# Patient Record
Sex: Female | Born: 1935 | Hispanic: No | Smoking: Never smoker
Health system: Southern US, Community
[De-identification: ages and names within clinical notes are randomized; demographics above are authoritative.]

## PROBLEM LIST (undated history)

## (undated) DIAGNOSIS — M5136 Other intervertebral disc degeneration, lumbar region: Secondary | ICD-10-CM

## (undated) DIAGNOSIS — E079 Disorder of thyroid, unspecified: Secondary | ICD-10-CM

## (undated) DIAGNOSIS — M419 Scoliosis, unspecified: Secondary | ICD-10-CM

## (undated) DIAGNOSIS — M51369 Other intervertebral disc degeneration, lumbar region without mention of lumbar back pain or lower extremity pain: Secondary | ICD-10-CM

## (undated) DIAGNOSIS — C801 Malignant (primary) neoplasm, unspecified: Secondary | ICD-10-CM

## (undated) DIAGNOSIS — M810 Age-related osteoporosis without current pathological fracture: Secondary | ICD-10-CM

## (undated) DIAGNOSIS — M5134 Other intervertebral disc degeneration, thoracic region: Secondary | ICD-10-CM

## (undated) HISTORY — PX: TONSILLECTOMY: SUR1361

## (undated) HISTORY — DX: Age-related osteoporosis without current pathological fracture: M81.0

## (undated) HISTORY — DX: Other intervertebral disc degeneration, lumbar region without mention of lumbar back pain or lower extremity pain: M51.369

## (undated) HISTORY — DX: Scoliosis, unspecified: M41.9

## (undated) HISTORY — DX: Malignant (primary) neoplasm, unspecified: C80.1

## (undated) HISTORY — DX: Other intervertebral disc degeneration, lumbar region: M51.36

## (undated) HISTORY — PX: TOTAL THYROIDECTOMY: SHX2547

## (undated) HISTORY — DX: Other intervertebral disc degeneration, thoracic region: M51.34

## (undated) HISTORY — DX: Disorder of thyroid, unspecified: E07.9

---

## 2001-10-05 ENCOUNTER — Other Ambulatory Visit: Admission: RE | Admit: 2001-10-05 | Discharge: 2001-10-05 | Payer: Self-pay | Admitting: Family Medicine

## 2003-04-17 ENCOUNTER — Ambulatory Visit (HOSPITAL_COMMUNITY): Admission: RE | Admit: 2003-04-17 | Discharge: 2003-04-17 | Payer: Self-pay | Admitting: General Surgery

## 2003-04-17 ENCOUNTER — Encounter: Payer: Self-pay | Admitting: General Surgery

## 2003-04-18 ENCOUNTER — Encounter: Payer: Self-pay | Admitting: General Surgery

## 2003-06-07 ENCOUNTER — Encounter (INDEPENDENT_AMBULATORY_CARE_PROVIDER_SITE_OTHER): Payer: Self-pay

## 2003-06-08 ENCOUNTER — Inpatient Hospital Stay (HOSPITAL_COMMUNITY): Admission: RE | Admit: 2003-06-08 | Discharge: 2003-06-09 | Payer: Self-pay | Admitting: General Surgery

## 2004-05-29 ENCOUNTER — Encounter (INDEPENDENT_AMBULATORY_CARE_PROVIDER_SITE_OTHER): Payer: Self-pay | Admitting: *Deleted

## 2004-05-29 ENCOUNTER — Ambulatory Visit (HOSPITAL_COMMUNITY): Admission: RE | Admit: 2004-05-29 | Discharge: 2004-05-29 | Payer: Self-pay | Admitting: Gastroenterology

## 2006-06-10 ENCOUNTER — Emergency Department (HOSPITAL_COMMUNITY): Admission: EM | Admit: 2006-06-10 | Discharge: 2006-06-10 | Payer: Self-pay | Admitting: Emergency Medicine

## 2007-09-22 ENCOUNTER — Ambulatory Visit: Payer: Self-pay | Admitting: *Deleted

## 2010-11-29 ENCOUNTER — Encounter (HOSPITAL_BASED_OUTPATIENT_CLINIC_OR_DEPARTMENT_OTHER): Payer: Self-pay | Admitting: General Surgery

## 2010-11-29 ENCOUNTER — Encounter: Payer: Self-pay | Admitting: Endocrinology

## 2010-11-30 ENCOUNTER — Encounter: Payer: Self-pay | Admitting: Internal Medicine

## 2011-03-24 NOTE — Procedures (Signed)
CAROTID DUPLEX EXAM   INDICATION:  Right carotid bruit.   HISTORY:  Diabetes:  No  Cardiac:  No  Hypertension:  Yes  Smoking:  No  Previous Surgery:  No  CV History:  No  Amaurosis Fugax No, Paresthesias No, Hemiparesis No                                       RIGHT             LEFT  Brachial systolic pressure:         130               130  Brachial Doppler waveforms:         Biphasic          Biphasic  Vertebral direction of flow:        Antegrade         Antegrade  DUPLEX VELOCITIES (cm/sec)  CCA peak systolic                   70                80  ECA peak systolic                   67                146  ICA peak systolic                   219 (distal)      190 (distal)  ICA end diastolic                   37                66  PLAQUE MORPHOLOGY:                  Heterogeneous     Heterogeneous  PLAQUE AMOUNT:                      Moderate          Moderate  PLAQUE LOCATION:                    ICA distal        ICA distal   IMPRESSION:  1. 60-79% stenosis noted in the right distal ICA.  2. 40-59% stenosis noted in the left distal ICA.  3. Antegrade bilateral vertebral arteries.   ___________________________________________  P. Liliane Bade, M.D.   MG/MEDQ  D:  09/22/2007  T:  09/23/2007  Job:  [id number]

## 2011-03-24 NOTE — Consult Note (Signed)
VASCULAR SURGERY CONSULTATION   Patricia Campbell, Patricia Campbell  DOB:  05-06-1927                                       09/22/2007  GMWNU#:[id number]   REASON FOR CONSULTATION:  Right carotid bruit.   HISTORY:  Patient is a 77 year old female referred for evaluation and  management of a right carotid bruit.  She has a history of headache.  She also complains of insomnia.  Has denied a head CT for workup of  headache.  No history of stroke.  Risk factors for cerebrovascular  disease include hypertension.  No family history of stroke.  No recent  visual change.  Denies motor or sensory deficit.  No clumsiness.  No  speech problems.   PAST MEDICAL HISTORY:  1. Hypertension.  2. Medullary thyroid cancer.   PAST SURGICAL HISTORY:  1. Total thyroidectomy.  2. Tonsillectomy.  3. Hysterectomy.  4. Appendectomy.   MEDICATIONS:  1. Levoxyl 75 mcg 1 tablet daily.  2. Ambien 10 mg 1 tablet nightly p.r.n.  3. Bisoprolol fumarate 5 mg 1 tablet daily.  4. Coenzyme Q10 1 tablet daily.  5. Bactrim DS 1 tablet b.i.d.   ALLERGIES:  CODEINE.   FAMILY HISTORY:  Mother deceased at age 5 of colon cancer.  Father  deceased at age 20 of lymphoma.  One sister deceased at age 56 of breast  CA and uterine CA.  Another sister deceased at age 75 of uterine CA.   SOCIAL HISTORY:  No alcohol or tobacco use.  She currently lives with  her brother but was married in September, 2008.  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  Limited activity.   REVIEW OF SYSTEMS:  Refer to patient encounter form.  This was reviewed  today.  The only positive complaint is that of a urinary tract  infection.  Denies constitutional, cardiac, pulmonary, GI, vascular,  neurologic, musculoskeletal, psychiatric, hematologic, visual or hearing  changes.   PHYSICAL EXAMINATION:  Moderately frail-appearing 77 year old female.  Alert and oriented.  No acute distress.  No pallor, cyanosis, or  jaundice.  Vital signs:  BP 150/69 left arm, 145/75 right arm.  Pulse 62 per  minute.  Respirations 16 per minute.  O2 saturation 96%.  Neck:  Supple.  No thyromegaly or adenopathy.  Thyroidectomy scar present.  HEENT:  Mouth and throat are clear.  Normocephalic.  Extraocular movements are  intact.  Cardiovascular:  Right carotid bruit.  Normal heart sounds  without murmurs.  No gallops or rubs.  Regular rate and rhythm.  Chest:  Air entry equal bilaterally.  No rales or rhonchi.  Abdomen:  Scaphoid.  No masses or organomegaly.  Nontender.  Neurologic:  Cranial nerves  intact.  Strength equal bilaterally.  Reflexes are 2+ bilaterally.  Normal gait.   INVESTIGATIONS:  Carotid Doppler evaluation reveals 60-79% right ICA  stenosis and 40-59% left ICA stenosis.  Bilateral antegrade vertebral  flow.   IMPRESSION:  1. Moderate asymmetry bilateral internal carotid artery occlusive      disease.  2. Hypertension.  3. History of medullary carcinoma of the thyroid.   MEDICAL DECISION MAKING:  Patient has asymptomatic moderate bilateral  internal carotid artery stenosis.  No history of stroke.  No symptoms of  transient ischemic attack.  I have recommended aspirin 81 mg daily.  Continue risk modification with hypertension control.  PLAN:  Follow up in one year with surveillance Doppler evaluation.   Balinda Quails, M.D.  Electronically Signed  PGH/MEDQ  D:  09/22/2007  T:  09/23/2007  Job:  [id number]

## 2011-03-27 NOTE — Op Note (Signed)
NAME:  Patricia Campbell, Patricia Campbell                          ACCOUNT NO.:  [id number]   MEDICAL RECORD NO.:  [id number]                   PATIENT TYPE:  AMB   LOCATION:  DAY                                  FACILITY:  [facility]   PHYSICIAN:  Anselm Pancoast. Zachery Dakins, M.D.          DATE OF BIRTH:  08-01-1927   DATE OF PROCEDURE:  06/07/2003  DATE OF DISCHARGE:                                 OPERATIVE REPORT   PREOPERATIVE DIAGNOSIS:  Right thyroid nodule.   POSTOPERATIVE DIAGNOSES:  Medullary carcinoma right lobe of thyroid.   OPERATION:  Total thyroid lobectomy.   SURGEON:  Anselm Pancoast. Zachery Dakins, M.D.   ASSISTANT:  Rose Phi. Maple Hudson, M.D.   ANESTHESIA:  General.   HISTORY OF PRESENT ILLNESS:  Patricia Campbell is a 77 year old female whose got  a cone nodule on the right lobe of the thyroid that has been present at  least for probably nine months or more. This does not appear to have  increased in size during this observation. Surgery was originally scheduled  approximately a month ago but rescheduled because of not completing a  cardiac clearance. This has been completed with no abnormalities noted. She  has not been on thyroid replacement.   DESCRIPTION OF PROCEDURE:  The patient was taken to the operative suite, she  was given a g of Kefzol, she has PAS stockings and positioned on the OR  table and after an endotracheal tube had been placed carefully a roll was  placed under her shoulders. You could feel the nodule and it was not  extremely large and the neck and superior chest was prepped with Betadine  solution, draped in a sterile manner. I marked the clavicle and the sternal  notch and took a silk and sore of dented about 2 fingerbreadths midline and  then made a small probably about 2 1/2 inch incision down through the skin  and subcutaneous elevating the platysma. We then elevated the strap muscles  and divided it medially and then carefully dissected on the right lobe of  the thyroid. You  could feel the nodule, it was kind of in the superior mid  portion. The remaining gland looked unremarkable. The mass appeared to be  well encapsulated but was quite firm. The inferior pole was first elevated,  the little branches of the inferior thyroid artery were divided close to the  gland and the larger ones were tied with 3-0 silk and the smaller little  branches were clipped. The middle thyroid vein was identified, this was  clamped proximally and distally and divided and ligated with fine silk  suture. Next, the superior pole was elevated and the little superior artery  was really a smaller branch than I thought would be normal and it was tied  with 3-0 silk and there was no other branch and I did not tie it twice  thinking it was not the major artery when I first  ligated it. We then  elevated the gland kind of medially, rotated it, identified the inferior  recurrent laryngeal nerve and then followed it. We identified an inferior  parathyroid that was left and then rolling the gland and then after the  gland elevated medially I was working on the right identifying the recurrent  laryngeal nerve. We divided the second little branch at the inferior thyroid  artery and then the gland would _______ so we could divide the isthmus  within hemostats. The middle isthmus area was ligated with 3-0 Vicryl and we  sent the lobe for pathology exam. Dr. Laureen Ochs examined it and feels that it is  a medullary carcinoma. With this finding, I had gone ahead and closed the  incision, of course we reopened everything and then with all of the stitches  all removed then elevated the strap muscles off the left lobe, it was normal  by inspection and then did a similar lobectomy on the left. I worked from  the right side on this rotating the gland medially. Two parathyroid glands  were identified and saved and the inferior thyroid artery branches were  taken very close to the thyroid lobe. The recurrent  laryngeal nerve was  identified, there was a larger middle vein on the left that was ligated with  3-0 silk and then the superior thyroid artery was doubly ligated with 3-0  silk. The gland was sent for permanent exam and it was a smaller gland and  grossly appeared to be normal. Good hemostasis on both the right and left  side. I then closed the strap muscles with interrupted sutures of 3-0 Vicryl  and then closed the platysma with interrupted 3-0 Vicryl and then 4-0  Monocryl subcuticular stitches and then Benzoin and Steri-Strips on the  skin. The patient tolerated the procedure nicely and I will have one of the  endocrinologist to see her for consideration of possibly radiation iodine  postoperatively. She will definitely need to go on thyroid replacement but  we ask their opinion in case they want to suppress her for a scan after the  thyroidectomy prior to starting the thyroid replacement.                                               Anselm Pancoast. Zachery Dakins, M.D.    WJW/MEDQ  D:  06/07/2003  T:  06/07/2003  Job:  [id number]   cc:   Sharlet Salina, M.D.  [address]  [place]  [place] [address]  Fax: [phone]

## 2011-03-27 NOTE — Discharge Summary (Signed)
NAME:  Patricia Campbell, Patricia Campbell                          ACCOUNT NO.:  [id number]   MEDICAL RECORD NO.:  [id number]                   PATIENT TYPE:  INP   LOCATION:  0443                                 FACILITY:  [facility]   PHYSICIAN:  Anselm Pancoast. Zachery Dakins, M.D.          DATE OF BIRTH:  Jun 06, 1927   DATE OF ADMISSION:  06/07/2003  DATE OF DISCHARGE:  06/09/2003                                 DISCHARGE SUMMARY   DISCHARGE DIAGNOSIS:  Medullary thyroid carcinoma, right lobe.   OPERATION:  Right total thyroid lobectomy.   HISTORY:  Eilleen Davoli is a 77 year old Caucasian female who was referred to  me courtesy of Dr. Crista Luria.  Originally I was supposed to see her  about eight months ago but she cancelled and then was seen in June.  She had  a thyroid mass that was noted on physical exam when she had a sore throat.  Ultrasounds were performed at [facility] that showed an irregular firm  mass in the right lobe that was thought suspicious for a carcinoma.  She has  had other diagnostic studies and was originally scheduled for a right  thyroid lobectomy in June but had not gotten cardiac clearance because of a  possible history of kind of an intermittent weakness or whatever and was  doing a Holter monitor evaluation, and this was cleared after the time that  she had been electively scheduled.  She then was rescheduled but I had to  postpone because of a commitment for a funeral that I need to attend, and  she is rescheduled at this time, being arranged so that her sister could be  with her postoperatively.  She is on Premarin 0.625 daily and is on a bunch  of digestive enzymes and dietary supplements, and calcium replacement, but  no other definite medication.  On physical exam, you could feel this firm  area, was probably about 2-3 cm in the right lobe.  A right thyroid  lobectomy was performed, Dr. Francina Ames assisted, with the frozen findings  consistent with a medullary carcinoma.  The  left lobe looked normal but we  went ahead and did a total thyroidectomy.  We visualized at least three of  the parathyroid glands as well as the recurrent laryngeal nerves  bilaterally.  Postoperatively she has had good voice, no problems breathing,  but her calcium did drop down about 7.9, then 7.3, and then back up to 7.7;  is 7.6 now on p.o. calcium replacement.  She is doing fine and is ready for  discharge with instructions to continue with the calcium replacement two  q.i.d. and p.r.n. if needed for any type of tingling.  I have talked to Dr.  Crista Luria and she has recommended that she see Dr. Talmage Nap at the [facility] and Dr. Talmage Nap asked that she withhold any type of  thyroid replacement until after she is seen, but  she will see her early next  week and she wants to do a calcitonin level and she will start her on  thyroid replacement at that time.  I will also have her check a calcium  level and hopefully we can start decreasing the calcium replacement at that  time also.  She says she does not  need any pain medication except for Extra Strength Tylenol and her sutures  are doing, the Steri-Strips are in place, and I will see her in my office  midweek.  I do not have the final pathology report back at this time and we  will make sure that copies are sent to Dr. Marny Lowenstein and Dr. Talmage Nap when they  are completed.                                               Anselm Pancoast. Zachery Dakins, M.D.    WJW/MEDQ  D:  06/09/2003  T:  06/09/2003  Job:  [id number]   cc:   Dorisann Frames, M.D.  [address] [place] [address]  Fax: [phone]   Sharlet Salina, M.D.  [address]  [place]  [place] [address]  Fax: [phone]

## 2011-03-27 NOTE — Op Note (Signed)
NAME:  Patricia Campbell, Patricia Campbell                          ACCOUNT NO.:  [id number]   MEDICAL RECORD NO.:  [id number]                   PATIENT TYPE:  AMB   LOCATION:  ENDO                                 FACILITY:  [facility]   PHYSICIAN:  James L. Malon Kindle., M.D.          DATE OF BIRTH:  04/24/1927   DATE OF PROCEDURE:  05/29/2004  DATE OF DISCHARGE:                                 OPERATIVE REPORT   PROCEDURE:  Colonoscopy and biopsy.   ENDOSCOPIST:  Llana Aliment. Edwards, M.D.   MEDICATIONS:  Fentanyl 100 mcg and Versed 6 mg IV.   SCOPE:  Olympus pediatric colonoscope.   INDICATION:  Diarrhea.   DESCRIPTION OF PROCEDURE:  Procedure had been explained to the patient and  consent obtained.  With the patient in the left lateral decubitus position,  the Olympus scope was inserted and advanced.  The prep was excellent.  We  were able to reach the cecum using abdominal pressure, ileocecal valve seen  and entered for approximately 2 cm and grossly was normal.  The scope was  withdrawn.  The mucosa was normal throughout the entire colon and multiple  random biopsies were obtained and placed in a single jar to look for signs  of microscopic colitis.  No polyps were seen in the ascending colon,  transverse colon, descending or sigmoid colon.  There was no diverticular  disease.  The rectum was free of polyps as well.  The scope was withdrawn.  The patient tolerated the procedure well.   ASSESSMENT:  1. Diarrhea.  I will need to rule out colitis.  2. Family history of colon cancer in her mother with negative colonoscopy,     V16.0.   PLAN:  We will check the pathology results and see back in the office in 2-4  weeks.  Will need a repeat colonoscopy in 5 years; we will put her on our  list.                                               James L. Malon Kindle., M.D.    Waldron Session  D:  05/29/2004  T:  05/29/2004  Job:  [id number]   cc:   Dorisann Frames, M.D.  [address] [place] [address]  Fax:  [phone]

## 2011-03-27 NOTE — H&P (Signed)
NAME:  Patricia Campbell, Patricia Campbell                          ACCOUNT NO.:  [id number]   MEDICAL RECORD NO.:  [id number]                   PATIENT TYPE:  AMB   LOCATION:  DAY                                  FACILITY:  [facility]   PHYSICIAN:  Anselm Pancoast. Zachery Dakins, M.D.          DATE OF BIRTH:  1927-01-30   DATE OF ADMISSION:  06/07/2003  DATE OF DISCHARGE:                                HISTORY & PHYSICAL   CHIEF COMPLAINT:  Thyroid mass.   HISTORY:  The patient is a 77 year old female who was referred to me  courtesy of Sharlet Salina, M.D. who is her primary physician who has  evaluated a thyroid mass.  The patient approximately a year ago had a cold  and they noted a thyroid enlargement.  She was originally given appointment  to see me, but cancelled.  She had had an ultrasound, thyroid functions.  This was done at [facility] and the area showed an irregular nodule within  the right lobe that was suspicious for a malignancy according to Dr. Katrinka Blazing  and then patient has had some vague cardiac type symptoms and has been  evaluated with a cardiac evaluation.  I saw her back in June and we had  tentatively scheduled her surgery about two weeks later but it was cancelled  since her cardiac evaluation had not been completed and she was treated with  a Holter monitor, etc. and then was ultimately cleared.  We rescheduled her  and then surgery was postponed the next time because of a funeral commitment  that I had with a friend and it was rescheduled for this week.  The patient  had not had any history of radiation exposure to the neck as a child.  Has  not had any real definite symptoms as far as her neck.  She is on a lot of  herbal medicines and dietary supplements, but has not been placed on thyroid  suppression.  The patient used to live in [place] and [place] and moved here  recently to be closer to her other family members.   ALLERGIES:  None noted.   PAST SURGICAL HISTORY:  Tonsillectomy.  Eyes,  ears, nose, and throat:  This  lump was found when she had a cold.  No other nodes were palpable within her  neck.   MEDICATIONS:  1. Premarin 0.6525 daily.  2. Tylenol as needed.  3. Digestive enzymes.  4. Whole bunch of dietary supplements.  5. Calcium replacement.  6. Garlic, etc.   REVIEW OF SYSTEMS:  She states that she has had some osteoporosis type  changes, I think in her hips.   PHYSICAL EXAMINATION:  GENERAL:  She is a pleasant, petite female.  Appears  her stated age.  In no acute distress.  VITAL SIGNS:  Weight 113, height 5 feet 3 inches, pulse 90, regular, blood  pressure 150/80, respirations 20, temperature 97.2.  HEENT:  Pupils equal.  Well hydrated.  Tongue is midline.  NECK:  No cervical or supraclavicular lymphadenopathy.  You can feel a  prominent, firm nodule in the right lobe of the thyroid.  LUNGS:  Good breath sounds.  CARDIAC:  Normal sinus rhythm.  ABDOMEN:  No organomegaly.  EXTREMITIES:  Lower extremities:  No pedal edema.  Did not do a pelvic or  rectal examination on her.  CNS:  Physiologic.  SKIN:  Unremarkable.   ADMISSION IMPRESSION:  1. Cold nodule right lower thyroid, irregular margin, possibly thyroid     cancer.   PLAN:  Right thyroid lobectomy.  I discussed with the patient that if it  would be a malignancy, that most likely she would need a total  thyroidectomy.  Counseled her on the risk of bleeding, the calcium  parathyroid situation.                                               Anselm Pancoast. Zachery Dakins, M.D.    WJW/MEDQ  D:  06/07/2003  T:  06/07/2003  Job:  [id number]

## 2012-06-15 ENCOUNTER — Ambulatory Visit (INDEPENDENT_AMBULATORY_CARE_PROVIDER_SITE_OTHER): Payer: BC Managed Care – PPO | Admitting: Internal Medicine

## 2012-06-15 VITALS — BP 115/48 | HR 72 | Temp 97.8°F | Resp 12 | Ht 60.5 in | Wt 93.0 lb

## 2012-06-15 DIAGNOSIS — G4700 Insomnia, unspecified: Secondary | ICD-10-CM

## 2012-06-15 DIAGNOSIS — E039 Hypothyroidism, unspecified: Secondary | ICD-10-CM

## 2012-06-15 DIAGNOSIS — I10 Essential (primary) hypertension: Secondary | ICD-10-CM

## 2012-06-15 MED ORDER — ZOLPIDEM TARTRATE 10 MG PO TABS: 10.0000 mg | ORAL_TABLET | Freq: Every evening | ORAL | Status: DC | PRN

## 2012-06-15 MED ORDER — LEVOTHYROXINE SODIUM 88 MCG PO TABS: 88.0000 ug | ORAL_TABLET | Freq: Every day | ORAL | Status: DC

## 2012-06-15 MED ORDER — BISOPROLOL FUMARATE 5 MG PO TABS: 5.0000 mg | ORAL_TABLET | Freq: Every day | ORAL | Status: DC

## 2012-06-15 NOTE — Progress Notes (Signed)
  Subjective:    Patient ID: Patricia Campbell, female    DOB: 06-21-1927, 78 y.o.   MRN: [id number]  HPI Moved here recently and is running out of her medication. Needs to get a primary care dr in [place].  Also complains of pain in her left knee and her left foot. Mild in severity increased with motion. No injury. No radiation of pain.    Review of Systems  HENT: Negative.   Eyes: Negative.   Respiratory: Negative.   Cardiovascular: Negative.   Gastrointestinal: Negative.   Musculoskeletal:       Pain in hte right knee and right foot .   Skin: Negative.   Neurological: Negative.   Hematological: Negative.   Psychiatric/Behavioral: Negative.   All other systems reviewed and are negative.       Objective:   Physical Exam  Nursing note and vitals reviewed. Constitutional: She is oriented to person, place, and time. She appears well-developed and well-nourished.  HENT:  Head: Normocephalic and atraumatic.  Right Ear: External ear normal.  Left Ear: External ear normal.  Eyes: Conjunctivae and EOM are normal. Pupils are equal, round, and reactive to light.  Neck: Normal range of motion. Neck supple.  Cardiovascular: Normal rate, regular rhythm and normal heart sounds.   Pulmonary/Chest: Effort normal and breath sounds normal.  Abdominal: Soft. Bowel sounds are normal.  Musculoskeletal: Normal range of motion.  Neurological: She is alert and oriented to person, place, and time. She has normal reflexes.  Skin: Skin is warm and dry.  Psychiatric: She has a normal mood and affect. Her behavior is normal. Judgment and thought content normal.          Assessment & Plan:  Hypertension well controlled Insomnia controlled with medication Hypothyroid asymptomatic Knee pain suggest arthritis Plan to renew meds and to arrange for primary care followup.

## 2012-06-15 NOTE — Patient Instructions (Signed)
Take meds as directed. See dr in primary care for futher evaluation

## 2012-09-16 ENCOUNTER — Ambulatory Visit (INDEPENDENT_AMBULATORY_CARE_PROVIDER_SITE_OTHER): Payer: Medicare Other | Admitting: Family Medicine

## 2012-09-16 ENCOUNTER — Encounter: Payer: Self-pay | Admitting: Family Medicine

## 2012-09-16 VITALS — BP 112/58 | HR 74 | Temp 97.7°F | Resp 16 | Ht 59.0 in | Wt 93.8 lb

## 2012-09-16 DIAGNOSIS — R031 Nonspecific low blood-pressure reading: Secondary | ICD-10-CM

## 2012-09-16 DIAGNOSIS — E039 Hypothyroidism, unspecified: Secondary | ICD-10-CM

## 2012-09-16 DIAGNOSIS — M810 Age-related osteoporosis without current pathological fracture: Secondary | ICD-10-CM

## 2012-09-16 DIAGNOSIS — Z760 Encounter for issue of repeat prescription: Secondary | ICD-10-CM

## 2012-09-16 LAB — CBC WITH DIFFERENTIAL/PLATELET
Basophils Absolute: 0 10*3/uL (ref 0.0–0.1)
Lymphocytes Relative: 28 % (ref 12–46)
Neutro Abs: 3.9 10*3/uL (ref 1.7–7.7)
Neutrophils Relative %: 61 % (ref 43–77)
Platelets: 230 10*3/uL (ref 150–400)
RDW: 14.4 % (ref 11.5–15.5)
WBC: 6.3 10*3/uL (ref 4.0–10.5)

## 2012-09-16 LAB — COMPREHENSIVE METABOLIC PANEL
ALT: 13 U/L (ref 0–35)
Alkaline Phosphatase: 44 U/L (ref 39–117)
Glucose, Bld: 82 mg/dL (ref 70–99)
Sodium: 131 mEq/L — ABNORMAL LOW (ref 135–145)
Total Bilirubin: 0.5 mg/dL (ref 0.3–1.2)
Total Protein: 6.3 g/dL (ref 6.0–8.3)

## 2012-09-16 LAB — T4, FREE: Free T4: 1.87 ng/dL — ABNORMAL HIGH (ref 0.80–1.80)

## 2012-09-16 MED ORDER — ALENDRONATE SODIUM 70 MG PO TABS: 70.0000 mg | ORAL_TABLET | ORAL | Status: DC

## 2012-09-16 NOTE — Patient Instructions (Signed)
STOP the blood pressure medication. We have reviewed all the supplements and have agreed on which ones Ms. Patricia Campbell will continue. I have ordered a BONE DENSITY STUDY and medication to be taken once a week.

## 2012-09-19 ENCOUNTER — Other Ambulatory Visit: Payer: Self-pay | Admitting: Family Medicine

## 2012-09-19 DIAGNOSIS — E039 Hypothyroidism, unspecified: Secondary | ICD-10-CM

## 2012-09-19 MED ORDER — LEVOTHYROXINE SODIUM 75 MCG PO TABS: 88.0000 ug | ORAL_TABLET | Freq: Every day | ORAL | Status: DC

## 2012-09-19 NOTE — Progress Notes (Signed)
Quick Note:  Please call pt and advise that the following labs are abnormal... Lab results show sodium is a little below normal. Pt can add just a little salt to her food; this will help her blood pressure also. Her dose of thyroid medication is a little too high so I am making a very small change in the dose of this medication (new dose is 75 mcg 1 tab daily). It will be routed to the pt's pharmacy for pick-up within the next day or so.  Thyroid blood test will need to be repeated in about 6-8 weeks; we will discuss this at pt's next appointment.  Copy to pt. ______

## 2012-09-21 NOTE — Progress Notes (Signed)
S: This elderly Cauc female is here to est care w/ this provider, having been seen at  [facility] for insomnia med refill. She takes an antihypertensive and thyroid supplement. Pt has osteoporosis and, reportedly, was receiving IV medication from her provider before she relocated to [facility]. She is here with a family member who is concerned about pt's unsteadiness and occasional dizziness.  Appetite is fair; there has been some weight loss. Pt is sedentary but able to ambulate w/o assistance. She has had no  Repetitive falls. Sleep is normal with Zolpidem. Pt does not wander at night.  ROS: Negative for diaphoresis, fever/chills, visual disturbances, ear or throat pain, dysphagia, CP or tightness, palpitations, SOB or cough, n/v/d/constipation, HA, weakness, numbness or syncope. Positive for bone and joint pain.  O:  Filed Vitals:   09/16/12 1002  BP: 112/58                               Weight unchanged from Aug 2013  Pulse: 74  Temp: 97.7 F (36.5 C)  Resp: 16   GEN: In NAD; WN,WD though appears somewhat frail. HEENT: /AT; EOMI w/ clear conj/ sclerae.Oroph benign except poor dentition. NECK: No LAN or TMG. COR: RRR w/o m/g/r. No peripheral edema. LUNGS: CTA; no wheezes or rales. EXT: Chronic deg changes in wrists and digits. Knees w/ deg changes w/ mild crepitus; no effusion or redness. SKIN: Fair turgor. No reashes.  Diffuse signs of aging. NEURO: A&O x 3; CNs intact except for diminished hearing. Gait somewhat unsteady and wide-based. GET-UP-AND-GO test: pt arises out of chair w/ some difficulty.   A/P: 1. Low blood pressure, not hypotension  CBC with Differential, Comprehensive metabolic panel Hold BP medication until return visit.  2. Hypothyroidism  TSH, T4, free  3. Osteoporosis  DG Bone Density RX: Fosamax 70 mg 1 tablet once a week.   We spent several minutes reviewing all the supplements that the pt is taking and have discontinued a few.

## 2012-10-18 ENCOUNTER — Ambulatory Visit (HOSPITAL_COMMUNITY)
Admission: RE | Admit: 2012-10-18 | Discharge: 2012-10-18 | Disposition: A | Discharge status: Home / Self Care | Payer: Medicare Other | Source: Ambulatory Visit | Attending: Family Medicine | Admitting: Family Medicine

## 2012-10-18 DIAGNOSIS — Z780 Asymptomatic menopausal state: Secondary | ICD-10-CM | POA: Insufficient documentation

## 2012-10-18 DIAGNOSIS — Z13820 Encounter for screening for osteoporosis: Secondary | ICD-10-CM | POA: Insufficient documentation

## 2012-10-18 DIAGNOSIS — M810 Age-related osteoporosis without current pathological fracture: Secondary | ICD-10-CM

## 2012-10-20 NOTE — Progress Notes (Signed)
Quick Note:  Notify pt and/or family member that bone density study confirms osteoporosis. Pt needs to continue to take the medication for osteoporosis (generic Fosamax).   ______

## 2012-10-21 ENCOUNTER — Ambulatory Visit (INDEPENDENT_AMBULATORY_CARE_PROVIDER_SITE_OTHER): Payer: Medicare Other | Admitting: Family Medicine

## 2012-10-21 ENCOUNTER — Encounter: Payer: Self-pay | Admitting: Family Medicine

## 2012-10-21 VITALS — BP 140/60 | HR 80 | Temp 97.6°F | Resp 16 | Ht 60.0 in | Wt 94.8 lb

## 2012-10-21 DIAGNOSIS — M810 Age-related osteoporosis without current pathological fracture: Secondary | ICD-10-CM

## 2012-10-21 DIAGNOSIS — E039 Hypothyroidism, unspecified: Secondary | ICD-10-CM

## 2012-10-21 MED ORDER — ALENDRONATE SODIUM 70 MG PO TABS: 70.0000 mg | ORAL_TABLET | ORAL | Status: DC

## 2012-10-21 MED ORDER — LEVOTHYROXINE SODIUM 75 MCG PO TABS: 88.0000 ug | ORAL_TABLET | Freq: Every day | ORAL | Status: DC

## 2012-10-21 NOTE — Progress Notes (Signed)
S:  This 78 y.o. Cauc female is here with a family member (younger sister) to follow-up re: discontinuation of BP medication. The pt has not noticed any significant difference but has not felt dizzines , weakness or syncope. Her appetite is fair; her sister thinks she does not eat enough. The pt states she stays busy and denies any new problems. She continues to take Zolpidem for sleep and has no adverse effects due to medications. She takes Fosamax on Sundays when she can remember but would like to change it to another day (sister suggest she take the med on Maonday).  ROS: As per HPI.  O:  Filed Vitals:   10/21/12 1343  BP: 140/60  Pulse: 80  Temp: 97.6 F (36.4 C)  Resp: 16   GEN: In NAD; WN,WD. Well groomed today. HENT: [employer]/AT; Conj/scl clear. No abnormalities. COR: RRR. No edema. LUNGS: Normal resp rate and effort. NEURO: A&O x3; CNs intact. Nonfocal except mentation- distant memory much better than recent recall.  DEXA resullts reviewed with pt and sister- Osteoporosis (T score= -3.2); pt at high risk for fractures.  A/P: 1. Hypothyroid  Cont Levothyroxine  75 MCG tablet  1 tab daily  2. Osteoporosis  Cont Alendronate  70 mg  1 tab once a week (change to Monday administration if this is easier for pt to remember). DEXA- repeat in 1 year.

## 2012-11-18 ENCOUNTER — Ambulatory Visit: Payer: Medicare Other | Admitting: Family Medicine

## 2012-12-12 ENCOUNTER — Encounter: Payer: Self-pay | Admitting: Family Medicine

## 2012-12-12 DIAGNOSIS — K222 Esophageal obstruction: Secondary | ICD-10-CM | POA: Insufficient documentation

## 2012-12-19 ENCOUNTER — Telehealth: Payer: Self-pay

## 2012-12-19 DIAGNOSIS — G4700 Insomnia, unspecified: Secondary | ICD-10-CM

## 2012-12-19 NOTE — Telephone Encounter (Signed)
Patient and her sister came in at 58 to advise patient has new insurance with a [employer] medicare ppo, effective 12/10/12 and the new ins will not covered ambien. Pt is requesting another rx be prescribed for her.

## 2012-12-19 NOTE — Telephone Encounter (Signed)
Called pt's pharmacy to see if any alternatives were given when Ambien was denied by ins, and pharmacist reported that pt just needs a new Rx for Ambien bc no RFs are left. Pharmacist ran a test claim and Ambien Rx did go through.  Spoke w/Susan Anner Crete who stated that she is scanning pt's new ins card into system now.   I called and spoke to [place], pt's sister (on HIPPA) and she stated that she left a form w/Suzy w/notice from new ins which stated that Ambien is not covered and pt needs to try one of their preferred generics first. I will check w/Suzy to get the letter for info.

## 2012-12-19 NOTE — Telephone Encounter (Signed)
Called ins - [employer] and completed prior auth over the phone for pt's Zolpidem 10 mg tabs and received approval from 11/19/12 - 12/19/13, case # [id number].  Dr Audria Nine, pharmacist had reported that there are no more RFs for this. Do you want to RF?

## 2012-12-20 ENCOUNTER — Telehealth: Payer: Self-pay

## 2012-12-20 ENCOUNTER — Other Ambulatory Visit: Payer: Self-pay | Admitting: Family Medicine

## 2012-12-20 MED ORDER — ZOLPIDEM TARTRATE 10 MG PO TABS: 10.0000 mg | ORAL_TABLET | Freq: Every evening | ORAL | Status: DC | PRN

## 2012-12-20 NOTE — Telephone Encounter (Signed)
Patient was in yesterday and gave [place] the wrong paper for authorizing her Ambien.  We have to call this authorization number to get her Ambien authorized [phone].  Please call Brenda(sister) if you have any questions at [phone].

## 2012-12-20 NOTE — Telephone Encounter (Signed)
Zolpidem 10 mg can be authorized; does this refill go to [facility] or to [facility] ?  It can be filled #30 with 5 refills.

## 2012-12-20 NOTE — Telephone Encounter (Signed)
Patricia Campbell who stated that pt gets all of her Rxs filled at [facility], none at [facility]. Calling in Rx to [facility] as written by Dr Audria Nine below.

## 2012-12-20 NOTE — Telephone Encounter (Signed)
Spoke to [place] and notified that prior Berkley Harvey was approved, but that I have sent a request to Dr Audria Nine to see if she will approve a RF and we will contact her to let her know after review. See phone message 12/19/12.

## 2013-02-24 ENCOUNTER — Ambulatory Visit: Payer: Medicare Other | Admitting: Family Medicine

## 2013-03-03 ENCOUNTER — Encounter: Payer: Self-pay | Admitting: Family Medicine

## 2013-03-03 ENCOUNTER — Ambulatory Visit (INDEPENDENT_AMBULATORY_CARE_PROVIDER_SITE_OTHER): Payer: Medicare Other | Admitting: Family Medicine

## 2013-03-03 ENCOUNTER — Ambulatory Visit: Payer: Medicare Other

## 2013-03-03 VITALS — BP 135/78 | HR 76 | Temp 97.6°F | Resp 16 | Ht 60.0 in | Wt 93.0 lb

## 2013-03-03 DIAGNOSIS — M546 Pain in thoracic spine: Secondary | ICD-10-CM

## 2013-03-03 DIAGNOSIS — M810 Age-related osteoporosis without current pathological fracture: Secondary | ICD-10-CM

## 2013-03-03 DIAGNOSIS — M4120 Other idiopathic scoliosis, site unspecified: Secondary | ICD-10-CM

## 2013-03-03 DIAGNOSIS — E039 Hypothyroidism, unspecified: Secondary | ICD-10-CM

## 2013-03-03 MED ORDER — LEVOTHYROXINE SODIUM 75 MCG PO TABS: 88.0000 ug | ORAL_TABLET | Freq: Every day | ORAL | Status: DC

## 2013-03-03 MED ORDER — ALENDRONATE SODIUM 70 MG PO TABS: 70.0000 mg | ORAL_TABLET | ORAL | Status: DC

## 2013-03-03 NOTE — Progress Notes (Signed)
S:  This 79 y.o. Cauc female has osteoporosis (last DEXA 10/18/2013) for > 5 years. Fosamax is medication prescribed and pt is compliant. She has chronic back pain and known to have scoliosis; her sister, Steward Drone, states pt is complaining daily about back. Pain is limiting pt's activities but she does perform ADLs. She has not fallen recently but has a hx of falls in the past.  PMHx, Soc Hx and Fam Hx reviewed.  ROS: As per HPI; negative for fever/chills, diaphoresis, abnormal weight loss, CP or SOB, cough, n/v/d, abd pain, numbness or weakness, paresthesias, abnormal movements or gait abnormality.  O: Filed Vitals:   03/03/13 1136  BP: 135/78  Pulse: 76  Temp: 97.6 F (36.4 C)  Resp: 16   GEN: In NAD; slender but well developed. Appears stated age. COR: RRR. LUNGS: Normal resp rate and effort. BACK: Mild kyphosis; palpable spinous processes w/ thoracic scoliosis (curve to L). NEURO A&O x 3; CNs intact. Pt ambulates w/o  aasistance.   [facility] reading (PRIMARY) by  Dr. Audria Nine: Thoracic spine- Severely osteopenic bones; severe scoliosis. Ill-defined opacities in lung fields.   A/P: Back pain, thoracic - Plan: OTC Tylenol extra-strength  1 tablet 2-3 times daily; moist heat and topical analgesic.  Idiopathic scoliosis- severe and probable cause of left-sided back pain.  Symptomatic measures for pain relief.  Osteoporosis, unspecified- continue Fosamax.  Hypothyroid - Continue levothyroxine (SYNTHROID, LEVOTHROID) 75 MCG tablet; check labs at next visit.

## 2013-03-03 NOTE — Patient Instructions (Addendum)
For back pain- get Tylenol extra strength and try taking 1 tablet 2-3 times a day for pain. Try moist heat and a topical cream or gel like Arnica Gel for pain reduction.

## 2013-03-05 ENCOUNTER — Telehealth: Payer: Self-pay | Admitting: Family Medicine

## 2013-03-05 NOTE — Telephone Encounter (Signed)
I spoke w/ pt's younger sister, Philippa Chester, about xray findings confirming severe scoliosis and degenerative changes of spine. Pt and sister were concerned that pt may have "a slipped disc" of spine. Advised that no vertebral fracture was seen but that osteopenic bones were present. Ms. Aldean Ast will give this information to pt.

## 2013-05-30 ENCOUNTER — Ambulatory Visit (INDEPENDENT_AMBULATORY_CARE_PROVIDER_SITE_OTHER): Payer: Medicare Other | Admitting: Family Medicine

## 2013-05-30 VITALS — BP 144/82 | HR 74 | Temp 97.4°F | Resp 17 | Ht 59.5 in | Wt 96.0 lb

## 2013-05-30 DIAGNOSIS — R350 Frequency of micturition: Secondary | ICD-10-CM

## 2013-05-30 DIAGNOSIS — R109 Unspecified abdominal pain: Secondary | ICD-10-CM

## 2013-05-30 LAB — POCT CBC
Granulocyte percent: 63.1 %G (ref 37–80)
HCT, POC: 41 % (ref 37.7–47.9)
MCV: 94.8 fL (ref 80–97)
MID (cbc): 0.4 (ref 0–0.9)
POC Granulocyte: 4.2 (ref 2–6.9)
Platelet Count, POC: 193 10*3/uL (ref 142–424)
RBC: 4.32 M/uL (ref 4.04–5.48)
RDW, POC: 14.5 %

## 2013-05-30 LAB — POCT UA - MICROSCOPIC ONLY
Crystals, Ur, HPF, POC: NEGATIVE
Mucus, UA: NEGATIVE
Yeast, UA: NEGATIVE

## 2013-05-30 LAB — POCT URINALYSIS DIPSTICK
Blood, UA: NEGATIVE
Nitrite, UA: NEGATIVE
Protein, UA: NEGATIVE
pH, UA: 7.5

## 2013-05-30 MED ORDER — CEPHALEXIN 500 MG PO CAPS: 500.0000 mg | ORAL_CAPSULE | Freq: Two times a day (BID) | ORAL | Status: DC

## 2013-05-30 NOTE — Progress Notes (Addendum)
[facility] [address] [place] [address] [phone]  Date:  05/30/2013   Name:  Patricia Campbell   DOB:  July 12, 1927   MRN:  [id number]  PCP:  Dow Adolph, MD    Chief Complaint: Urinary Tract Infection and Urinary Frequency   History of Present Illness:  Patricia Campbell is a 79 y.o. very pleasant female patient who presents with the following:  She is concerned about a possible UTI.  She has noted some lower back, lower abdominal pain and urinary frequency for the last couple of weeks.  No dysuria, no hematuria.  No fever or chills.  This seems like a UTI to her.  She is otherwise generally healthy for her age.    Patient Active Problem List   Diagnosis Date Noted  . Idiopathic scoliosis 03/03/2013  . Schatzki's ring 12/12/2012  . Hypothyroidism 09/16/2012  . Osteoporosis 09/16/2012    History reviewed. No pertinent past medical history.  History reviewed. No pertinent past surgical history.  History  Substance Use Topics  . Smoking status: Never Smoker   . Smokeless tobacco: Not on file  . Alcohol Use: Not on file    History reviewed. No pertinent family history.  Allergies  Allergen Reactions  . Codeine     Medication list has been reviewed and updated.  Current Outpatient Prescriptions on File Prior to Visit  Medication Sig Dispense Refill  . alendronate (FOSAMAX) 70 MG tablet Take 1 tablet (70 mg total) by mouth every 7 (seven) days. Take with a full glass of water on an empty stomach.  4 tablet  5  . levothyroxine (SYNTHROID, LEVOTHROID) 75 MCG tablet Take 1 tablet (75 mcg total) by mouth daily.  30 tablet  5  . OVER THE COUNTER MEDICATION Lutein 25 mg plus Zenxanthin 5 mg taking daily      . OVER THE COUNTER MEDICATION Focus Factor taking one daily      . OVER THE COUNTER MEDICATION Vitamin E 400 iu daily      . OVER THE COUNTER MEDICATION Vitamin B complex taking daily      . OVER THE COUNTER MEDICATION Sublingual B 12 ( B 6  + Folic acid  ) taking daily      . OVER THE COUNTER MEDICATION Alfalfa 500 mg taking  2 a day      . OVER THE COUNTER MEDICATION Co Q 10 100 mg taking one daily      . OVER THE COUNTER MEDICATION D 3 2000 iu taking daily      . OVER THE COUNTER MEDICATION Vitamin C 500 mg prn      . OVER THE COUNTER MEDICATION Flaxseed Oil 1000 mg      . zolpidem (AMBIEN) 10 MG tablet Take 1 tablet (10 mg total) by mouth at bedtime as needed.  30 tablet  5   No current facility-administered medications on file prior to visit.    Review of Systems:  As per HPI- otherwise negative. She notes that she has had back pain for some time- years actually.    Physical Examination: Filed Vitals:   05/30/13 1316  BP: 144/82  Pulse: 74  Temp: 97.4 F (36.3 C)  Resp: 17    Body mass index is 19.07 kg/(m^2). Ideal Body Weight: Weight in (lb) to have BMI = 25: 125.6  GEN: WDWN, NAD, Non-toxic, A & O x 3, small build  HEENT: Atraumatic, Normocephalic. Neck supple. No masses, No LAD.  Ears and Nose: No external deformity. CV: RRR, No M/G/R. No JVD. No thrill. No extra heart sounds. PULM: CTA B, no wheezes, crackles, rhonchi. No retractions. No resp. distress. No accessory muscle use. ABD: S,  ND, +BS. No rebound. No HSM.  Minimal tenderness over abdomen, most in lower abdomen.  No rash or lesion of back, no CVA tenderness EXTR: No c/c/e NEURO Normal gait.  PSYCH: Normally interactive. Conversant. Not depressed or anxious appearing.  Calm demeanor.    Results for orders placed in visit on 05/30/13  POCT UA - MICROSCOPIC ONLY      Result Value Range   WBC, Ur, HPF, POC 3-8     RBC, urine, microscopic 0-1     Bacteria, U Microscopic trace     Mucus, UA neg     Epithelial cells, urine per micros 0-1     Crystals, Ur, HPF, POC neg     Casts, Ur, LPF, POC neg     Yeast, UA neg    POCT URINALYSIS DIPSTICK      Result Value Range   Color, UA yellow     Clarity, UA cloudy     Glucose, UA neg      Bilirubin, UA neg     Ketones, UA neg     Spec Grav, UA 1.015     Blood, UA neg     pH, UA 7.5     Protein, UA neg     Urobilinogen, UA 0.2     Nitrite, UA neg     Leukocytes, UA large (3+)    POCT CBC      Result Value Range   WBC 6.6  4.6 - 10.2 K/uL   Lymph, poc 2.1  0.6 - 3.4   POC LYMPH PERCENT 31.5  10 - 50 %L   MID (cbc) 0.4  0 - 0.9   POC MID % 5.4  0 - 12 %M   POC Granulocyte 4.2  2 - 6.9   Granulocyte percent 63.1  37 - 80 %G   RBC 4.32  4.04 - 5.48 M/uL   Hemoglobin 12.5  12.2 - 16.2 g/dL   HCT, POC 40.9  81.1 - 47.9 %   MCV 94.8  80 - 97 fL   MCH, POC 28.9  27 - 31.2 pg   MCHC 30.5 (*) 31.8 - 35.4 g/dL   RDW, POC 91.4     Platelet Count, POC 193  142 - 424 K/uL   MPV 8.8  0 - 99.8 fL      Assessment and Plan: Urinary frequency - Plan: POCT UA - Microscopic Only, POCT urinalysis dipstick, POCT CBC, Urine culture, cephALEXin (KEFLEX) 500 MG capsule  Abdominal pain, unspecified site - Plan: Comprehensive metabolic panel  likley UTI.  Treat with keflex BID for one week, await CMP and urine culture.  Follow-up if not better or if getting worse   Signed Abbe Amsterdam, MD  7/27- received her urine culture results- called her.  She is feeling better.  Went over her CMP as well Results for orders placed in visit on 05/30/13  URINE CULTURE      Result Value Range   Culture PROTEUS MIRABILIS     Colony Count 10,000 COLONIES/ML     Organism ID, Bacteria PROTEUS MIRABILIS    COMPREHENSIVE METABOLIC PANEL      Result Value Range   Sodium 136  135 - 145 mEq/L   Potassium 4.3  3.5 - 5.3 mEq/L  Chloride 98  96 - 112 mEq/L   CO2 28  19 - 32 mEq/L   Glucose, Bld 154 (*) 70 - 99 mg/dL   BUN 14  6 - 23 mg/dL   Creat 9.60  4.54 - 0.98 mg/dL   Total Bilirubin 0.4  0.3 - 1.2 mg/dL   Alkaline Phosphatase 58  39 - 117 U/L   AST 23  0 - 37 U/L   ALT 15  0 - 35 U/L   Total Protein 6.6  6.0 - 8.3 g/dL   Albumin 4.2  3.5 - 5.2 g/dL   Calcium 9.2  8.4 - 11.9 mg/dL   POCT UA - MICROSCOPIC ONLY      Result Value Range   WBC, Ur, HPF, POC 3-8     RBC, urine, microscopic 0-1     Bacteria, U Microscopic trace     Mucus, UA neg     Epithelial cells, urine per micros 0-1     Crystals, Ur, HPF, POC neg     Casts, Ur, LPF, POC neg     Yeast, UA neg    POCT URINALYSIS DIPSTICK      Result Value Range   Color, UA yellow     Clarity, UA cloudy     Glucose, UA neg     Bilirubin, UA neg     Ketones, UA neg     Spec Grav, UA 1.015     Blood, UA neg     pH, UA 7.5     Protein, UA neg     Urobilinogen, UA 0.2     Nitrite, UA neg     Leukocytes, UA large (3+)    POCT CBC      Result Value Range   WBC 6.6  4.6 - 10.2 K/uL   Lymph, poc 2.1  0.6 - 3.4   POC LYMPH PERCENT 31.5  10 - 50 %L   MID (cbc) 0.4  0 - 0.9   POC MID % 5.4  0 - 12 %M   POC Granulocyte 4.2  2 - 6.9   Granulocyte percent 63.1  37 - 80 %G   RBC 4.32  4.04 - 5.48 M/uL   Hemoglobin 12.5  12.2 - 16.2 g/dL   HCT, POC 14.7  82.9 - 47.9 %   MCV 94.8  80 - 97 fL   MCH, POC 28.9  27 - 31.2 pg   MCHC 30.5 (*) 31.8 - 35.4 g/dL   RDW, POC 56.2     Platelet Count, POC 193  142 - 424 K/uL   MPV 8.8  0 - 99.8 fL    Her glucose is 150, but she had eaten lunch prior to her visit. She will let us know if any other problems

## 2013-05-30 NOTE — Patient Instructions (Addendum)
Start taking the keflex antibiotic for your urine- I will be in touch with the rest of your labs when they come in.

## 2013-05-31 LAB — COMPREHENSIVE METABOLIC PANEL
ALT: 15 U/L (ref 0–35)
AST: 23 U/L (ref 0–37)
Alkaline Phosphatase: 58 U/L (ref 39–117)
BUN: 14 mg/dL (ref 6–23)
Calcium: 9.2 mg/dL (ref 8.4–10.5)
Chloride: 98 mEq/L (ref 96–112)
Creat: 0.66 mg/dL (ref 0.50–1.10)

## 2013-06-02 LAB — URINE CULTURE: Colony Count: 10000

## 2013-06-08 ENCOUNTER — Ambulatory Visit: Payer: Medicare Other | Admitting: Family Medicine

## 2013-08-30 ENCOUNTER — Telehealth: Payer: Self-pay

## 2013-08-30 DIAGNOSIS — G4700 Insomnia, unspecified: Secondary | ICD-10-CM

## 2013-08-30 NOTE — Telephone Encounter (Signed)
Pharm reqs RF of zolpidem 10 mg

## 2013-08-31 MED ORDER — ZOLPIDEM TARTRATE 10 MG PO TABS
ORAL_TABLET | ORAL | Status: DC
Start: 2013-08-31 — End: 2013-11-29

## 2013-08-31 NOTE — Telephone Encounter (Signed)
Zolpidem refill phoned to pharmacy. Pt will be advised to schedule office visit- last seen by me in April 2014.

## 2013-09-21 ENCOUNTER — Ambulatory Visit (INDEPENDENT_AMBULATORY_CARE_PROVIDER_SITE_OTHER): Payer: BC Managed Care – PPO | Admitting: Emergency Medicine

## 2013-09-21 ENCOUNTER — Ambulatory Visit: Payer: Medicare Other

## 2013-09-21 VITALS — BP 140/90 | HR 78 | Temp 98.0°F | Resp 16 | Ht 59.0 in | Wt 98.0 lb

## 2013-09-21 DIAGNOSIS — R350 Frequency of micturition: Secondary | ICD-10-CM

## 2013-09-21 DIAGNOSIS — R300 Dysuria: Secondary | ICD-10-CM

## 2013-09-21 DIAGNOSIS — M549 Dorsalgia, unspecified: Secondary | ICD-10-CM

## 2013-09-21 LAB — POCT CBC
Granulocyte percent: 58.2 %G (ref 37–80)
HCT, POC: 38 % (ref 37.7–47.9)
Hemoglobin: 11.4 g/dL — AB (ref 12.2–16.2)
Lymph, poc: 2.1 (ref 0.6–3.4)
MCHC: 30 g/dL — AB (ref 31.8–35.4)
MPV: 9.9 fL (ref 0–99.8)
POC Granulocyte: 3.4 (ref 2–6.9)
POC MID %: 6.8 %M (ref 0–12)

## 2013-09-21 LAB — POCT UA - MICROSCOPIC ONLY
Bacteria, U Microscopic: NEGATIVE
Casts, Ur, LPF, POC: NEGATIVE
Crystals, Ur, HPF, POC: NEGATIVE
Mucus, UA: NEGATIVE

## 2013-09-21 LAB — POCT URINALYSIS DIPSTICK
Bilirubin, UA: NEGATIVE
Blood, UA: NEGATIVE
Glucose, UA: NEGATIVE
Ketones, UA: NEGATIVE
Spec Grav, UA: 1.01
pH, UA: 6.5

## 2013-09-21 MED ORDER — CEPHALEXIN 500 MG PO CAPS: 500.0000 mg | ORAL_CAPSULE | Freq: Two times a day (BID) | ORAL | Status: DC

## 2013-09-21 NOTE — Patient Instructions (Signed)
Urinary Tract Infection  Urinary tract infections (UTIs) can develop anywhere along your urinary tract. Your urinary tract is your body's drainage system for removing wastes and extra water. Your urinary tract includes two kidneys, two ureters, a bladder, and a urethra. Your kidneys are a pair of bean-shaped organs. Each kidney is about the size of your fist. They are located below your ribs, one on each side of your spine.  CAUSES  Infections are caused by microbes, which are microscopic organisms, including fungi, viruses, and bacteria. These organisms are so small that they can only be seen through a microscope. Bacteria are the microbes that most commonly cause UTIs.  SYMPTOMS   Symptoms of UTIs may vary by age and gender of the patient and by the location of the infection. Symptoms in young women typically include a frequent and intense urge to urinate and a painful, burning feeling in the bladder or urethra during urination. Older women and men are more likely to be tired, shaky, and weak and have muscle aches and abdominal pain. A fever may mean the infection is in your kidneys. Other symptoms of a kidney infection include pain in your back or sides below the ribs, nausea, and vomiting.  DIAGNOSIS  To diagnose a UTI, your caregiver will ask you about your symptoms. Your caregiver also will ask to provide a urine sample. The urine sample will be tested for bacteria and white blood cells. White blood cells are made by your body to help fight infection.  TREATMENT   Typically, UTIs can be treated with medication. Because most UTIs are caused by a bacterial infection, they usually can be treated with the use of antibiotics. The choice of antibiotic and length of treatment depend on your symptoms and the type of bacteria causing your infection.  HOME CARE INSTRUCTIONS   If you were prescribed antibiotics, take them exactly as your caregiver instructs you. Finish the medication even if you feel better after you  have only taken some of the medication.   Drink enough water and fluids to keep your urine clear or pale yellow.   Avoid caffeine, tea, and carbonated beverages. They tend to irritate your bladder.   Empty your bladder often. Avoid holding urine for long periods of time.   Empty your bladder before and after sexual intercourse.   After a bowel movement, women should cleanse from front to back. Use each tissue only once.  SEEK MEDICAL CARE IF:    You have back pain.   You develop a fever.   Your symptoms do not begin to resolve within 3 days.  SEEK IMMEDIATE MEDICAL CARE IF:    You have severe back pain or lower abdominal pain.   You develop chills.   You have nausea or vomiting.   You have continued burning or discomfort with urination.  MAKE SURE YOU:    Understand these instructions.   Will watch your condition.   Will get help right away if you are not doing well or get worse.  Document Released: 08/05/2005 Document Revised: 04/26/2012 Document Reviewed: 12/04/2011  ExitCare Patient Information 2014 [employer].            Back Pain, Adult  Low back pain is very common. About 1 in 5 people have back pain.The cause of low back pain is rarely dangerous. The pain often gets better over time.About half of people with a sudden onset of back pain feel better in just 2 weeks. About 8 in 10 people   feel better by 6 weeks.   CAUSES  Some common causes of back pain include:   Strain of the muscles or ligaments supporting the spine.   Wear and tear (degeneration) of the spinal discs.   Arthritis.   Direct injury to the back.  DIAGNOSIS  Most of the time, the direct cause of low back pain is not known.However, back pain can be treated effectively even when the exact cause of the pain is unknown.Answering your caregiver's questions about your overall health and symptoms is one of the most accurate ways to make sure the cause of your pain is not dangerous. If your caregiver needs more information, he  or she may order lab work or imaging tests (X-rays or MRIs).However, even if imaging tests show changes in your back, this usually does not require surgery.  HOME CARE INSTRUCTIONS  For many people, back pain returns.Since low back pain is rarely dangerous, it is often a condition that people can learn to manageon their own.    Remain active. It is stressful on the back to sit or stand in one place. Do not sit, drive, or stand in one place for more than 30 minutes at a time. Take short walks on level surfaces as soon as pain allows.Try to increase the length of time you walk each day.   Do not stay in bed.Resting more than 1 or 2 days can delay your recovery.   Do not avoid exercise or work.Your body is made to move.It is not dangerous to be active, even though your back may hurt.Your back will likely heal faster if you return to being active before your pain is gone.   Pay attention to your body when you bend and lift. Many people have less discomfortwhen lifting if they bend their knees, keep the load close to their bodies,and avoid twisting. Often, the most comfortable positions are those that put less stress on your recovering back.   Find a comfortable position to sleep. Use a firm mattress and lie on your side with your knees slightly bent. If you lie on your back, put a pillow under your knees.   Only take over-the-counter or prescription medicines as directed by your caregiver. Over-the-counter medicines to reduce pain and inflammation are often the most helpful.Your caregiver may prescribe muscle relaxant drugs.These medicines help dull your pain so you can more quickly return to your normal activities and healthy exercise.   Put ice on the injured area.   Put ice in a plastic bag.   Place a towel between your skin and the bag.   Leave the ice on for 15-20 minutes, 03-04 times a day for the first 2 to 3 days. After that, ice and heat may be alternated to reduce pain and spasms.   Ask  your caregiver about trying back exercises and gentle massage. This may be of some benefit.   Avoid feeling anxious or stressed.Stress increases muscle tension and can worsen back pain.It is important to recognize when you are anxious or stressed and learn ways to manage it.Exercise is a great option.  SEEK MEDICAL CARE IF:   You have pain that is not relieved with rest or medicine.   You have pain that does not improve in 1 week.   You have new symptoms.   You are generally not feeling well.  SEEK IMMEDIATE MEDICAL CARE IF:    You have pain that radiates from your back into your legs.   You develop new bowel   or bladder control problems.   You have unusual weakness or numbness in your arms or legs.   You develop nausea or vomiting.   You develop abdominal pain.   You feel faint.  Document Released: 10/26/2005 Document Revised: 04/26/2012 Document Reviewed: 03/16/2011  [employer] Patient Information 2014 [employer].

## 2013-09-21 NOTE — Progress Notes (Addendum)
This chart was scribed for Lesle Chris, MD by Ardelia Mems, Scribe. This patient was seen in room 14 and the patient's care was started at 2:25 PM.   Subjective:    Patient ID: Patricia Campbell, female    DOB: 02-Jun-1927, 79 y.o.   MRN: [id number]  Chief Complaint  Patient presents with  . Urinary Frequency    burning with urination back and abdomen pain x several weeks    HPI  HPI Comments: Patricia Campbell is a 79 y.o. female who presents to the Emergency Department complaining of gradually worsening, intermittent, mild lower abdominal pain and back pain that over the past 3-4 days. She also reports associated dysuria and urinary frequency. She believes that she has a bladder infection. She states that she lives alone. She states that she has been doing some yard work recently, but that she had pain prior to this. She denies fever, emesis or reduced appetite   No past medical history on file.  Current Outpatient Prescriptions on File Prior to Visit  Medication Sig Dispense Refill  . alendronate (FOSAMAX) 70 MG tablet Take 1 tablet (70 mg total) by mouth every 7 (seven) days. Take with a full glass of water on an empty stomach.  4 tablet  5  . levothyroxine (SYNTHROID, LEVOTHROID) 75 MCG tablet Take 1 tablet (75 mcg total) by mouth daily.  30 tablet  5  . OVER THE COUNTER MEDICATION Lutein 25 mg plus Zenxanthin 5 mg taking daily      . OVER THE COUNTER MEDICATION Focus Factor taking one daily      . OVER THE COUNTER MEDICATION Vitamin E 400 iu daily      . OVER THE COUNTER MEDICATION Vitamin B complex taking daily      . OVER THE COUNTER MEDICATION Sublingual B 12 ( B 6 + Folic acid  ) taking daily      . OVER THE COUNTER MEDICATION Alfalfa 500 mg taking  2 a day      . OVER THE COUNTER MEDICATION Co Q 10 100 mg taking one daily      . OVER THE COUNTER MEDICATION D 3 2000 iu taking daily      . OVER THE COUNTER MEDICATION Vitamin C 500 mg prn      . OVER THE COUNTER MEDICATION Flaxseed Oil  1000 mg      . zolpidem (AMBIEN) 10 MG tablet Take 1/2 - 1 tablet at bedtime as needed for sleep.  30 tablet  2   No current facility-administered medications on file prior to visit.   Allergies  Allergen Reactions  . Codeine     Review of Systems  Constitutional: Negative for fever and appetite change.  Gastrointestinal: Positive for abdominal pain. Negative for vomiting.  Genitourinary: Positive for dysuria and frequency.  Musculoskeletal: Positive for back pain.     BP 140/90  Pulse 78  Temp(Src) 98 F (36.7 C) (Oral)  Resp 16  Ht 4\' 11"  (1.499 m)  Wt 98 lb (44.453 kg)  BMI 19.78 kg/m2  SpO2 96%  Objective:   Physical Exam  CONSTITUTIONAL: Well developed/well nourished HEAD: Normocephalic/atraumatic EYES: EOMI/PERRL ENMT: Mucous membranes moist NECK: supple no meningeal signs SPINE:entire spine nontender CV: S1/S2 noted, no murmurs/rubs/gallops noted LUNGS: Lungs are clear to auscultation bilaterally, no apparent distress ABDOMEN: soft, no rebound or guarding there is mild tenderness in the left lower abdomen. GU:no cva tenderness NEURO: Pt is awake/alert, moves all extremitiesx4 EXTREMITIES: pulses normal, full  ROM SKIN: warm, color normal PSYCH: no abnormalities of mood noted Back exam reveals tenderness at L4-L5 on the right.   [facility] reading (PRIMARY) by  Dr.Daryel Kenneth she has severe kyphoscoliosis with associated degenerative arthritis. Results for orders placed in visit on 09/21/13  POCT URINALYSIS DIPSTICK      Result Value Range   Color, UA yellow     Clarity, UA clear     Glucose, UA neg     Bilirubin, UA neg     Ketones, UA neg     Spec Grav, UA 1.010     Blood, UA neg     pH, UA 6.5     Protein, UA neg     Urobilinogen, UA 0.2     Nitrite, UA neg     Leukocytes, UA moderate (2+)    POCT UA - MICROSCOPIC ONLY      Result Value Range   WBC, Ur, HPF, POC 3-8     RBC, urine, microscopic 0-1     Bacteria, U Microscopic neg     Mucus, UA neg      Epithelial cells, urine per micros 0-1     Crystals, Ur, HPF, POC neg     Casts, Ur, LPF, POC neg     Yeast, UA neg    POCT CBC      Result Value Range   WBC 5.9  4.6 - 10.2 K/uL   Lymph, poc 2.1  0.6 - 3.4   POC LYMPH PERCENT 35.0  10 - 50 %L   MID (cbc) 0.4  0 - 0.9   POC MID % 6.8  0 - 12 %M   POC Granulocyte 3.4  2 - 6.9   Granulocyte percent 58.2  37 - 80 %G   RBC 3.97 (*) 4.04 - 5.48 M/uL   Hemoglobin 11.4 (*) 12.2 - 16.2 g/dL   HCT, POC 16.1  09.6 - 47.9 %   MCV 95.7  80 - 97 fL   MCH, POC 28.7  27 - 31.2 pg   MCHC 30.0 (*) 31.8 - 35.4 g/dL   RDW, POC 04.5     Platelet Count, POC 174  142 - 424 K/uL   MPV 9.9  0 - 99.8 fL     Assessment & Plan:  The primary encounter diagnosis was Urinary frequency. A diagnosis of Burning with urination was also pertinent to this visit. will treat with cephalexin 500 twice a day for 5 days. Urine culture was done. She can take Tylenol for her back pain. Also recommend she take probiotics while she is on antibiotics.  I personally performed the services described in this documentation, which was scribed in my presence. The recorded information has been reviewed and is accurate.

## 2013-10-23 ENCOUNTER — Other Ambulatory Visit: Payer: Self-pay | Admitting: Family Medicine

## 2013-10-24 ENCOUNTER — Other Ambulatory Visit: Payer: Self-pay | Admitting: Family Medicine

## 2013-11-22 ENCOUNTER — Other Ambulatory Visit: Payer: Self-pay | Admitting: Family Medicine

## 2013-11-24 ENCOUNTER — Telehealth: Payer: Self-pay

## 2013-11-24 NOTE — Telephone Encounter (Signed)
Patients pharmacy [facility] says that the patient has been requesting medication called alendronate (FOSAMAX) 70 MG tablet to get refilled. She informed them that they should have received an electronic requests. I told pharmacist that the refill she received last year 10/2013 for it says she needed an office visit. Please advise, I told them I would still put in message as requested per patient. Pharmacists says patient says it is urgent.

## 2013-11-25 ENCOUNTER — Other Ambulatory Visit: Payer: Self-pay | Admitting: Family Medicine

## 2013-11-25 NOTE — Telephone Encounter (Signed)
Spoke to pt. She is coming in to see Dr. Everlene Farrier on Monday.

## 2013-11-29 ENCOUNTER — Ambulatory Visit (INDEPENDENT_AMBULATORY_CARE_PROVIDER_SITE_OTHER): Payer: BC Managed Care – PPO | Admitting: Family Medicine

## 2013-11-29 ENCOUNTER — Encounter: Payer: Self-pay | Admitting: Family Medicine

## 2013-11-29 VITALS — BP 124/60 | HR 75 | Temp 97.8°F | Resp 16 | Ht 59.0 in | Wt 97.0 lb

## 2013-11-29 DIAGNOSIS — G4700 Insomnia, unspecified: Secondary | ICD-10-CM

## 2013-11-29 DIAGNOSIS — M810 Age-related osteoporosis without current pathological fracture: Secondary | ICD-10-CM

## 2013-11-29 DIAGNOSIS — E039 Hypothyroidism, unspecified: Secondary | ICD-10-CM

## 2013-11-29 DIAGNOSIS — L259 Unspecified contact dermatitis, unspecified cause: Secondary | ICD-10-CM

## 2013-11-29 MED ORDER — ZOLPIDEM TARTRATE 10 MG PO TABS: ORAL_TABLET | ORAL | Status: DC

## 2013-11-29 MED ORDER — LEVOTHYROXINE SODIUM 75 MCG PO TABS: 88.0000 ug | ORAL_TABLET | Freq: Every day | ORAL | Status: DC

## 2013-11-29 MED ORDER — ALENDRONATE SODIUM 70 MG PO TABS: ORAL_TABLET | ORAL | Status: DC

## 2013-11-29 NOTE — Progress Notes (Signed)
S:  This 80 y.o. Cauc female is here for follow-up, medication refills and eval of itchy rash on fingers. Pt is compliant w/ medications and takes many OTC supplements.   Pt c/o red scaly rash on digits. She denies exposure to any chemicals w/ household chores. She has been out in the yard picking up leaves. No rash noted on palms or extending up arms.  Hypothyroidism- pt denies any fatigue, appetite or weight change, CP or palpitations, SOB or DOE, skin or hair changes, constipation, HA, dizziness, weakness, tremor or syncope.  Pt has chronic sleep disturbance but no behavior changes, agitation, confusion or thoughts of self harm.  Osteoporosis- last DEXA in 2013 confirms ongoing disease. Pt takes Fosamax once a week. No c/o dysphagia or reflux. She tries to stay active around the home. No reports of falls or injuries or bone pain.   Patient Active Problem List   Diagnosis Date Noted  . Idiopathic scoliosis 03/03/2013  . Schatzki's ring 12/12/2012  . Hypothyroidism 09/16/2012  . Osteoporosis 09/16/2012   PMHx, Soc and Fam Hx reviewed.  Medications reconciled.  ROS: As per HPI.  O: Filed Vitals:   11/29/13 1031  BP: 124/60  Pulse: 75  Temp: 97.8 F (36.6 C)  Resp: 16   GEN: In NAD; WN,WD. Appears stated age. HENT: [place]/AT; dysconjugate gaze w/ clear conj/sclerae. Otherwise unremarkable. COR: RRR. LUNGS: Unlabored resp. SKIN: W&D; intact. Fingers have mild erythematous, flaky patches. No ulcerations or signs of infection. MS: MAEs; degenerative changes in digits/ Heberden's nodes on fingers. NEURO: A&O x 3; CNs intact. Nonfocal.  A/P: Contact dermatitis- Try OTC hydrocortisone cream- use sparingly.  Hypothyroid - No medication change at this time. Plan: levothyroxine (SYNTHROID, LEVOTHROID) 75 MCG tablet  Insomnia - Plan: zolpidem (AMBIEN) 10 MG tablet  Osteoporosis- Reviewed results of 2013 DEXA (osteoporosis w/ high fracture risk).  Meds ordered this encounter   Medications  . alendronate (FOSAMAX) 70 MG tablet    Sig: TAKE 1 TABLET (70 MG TOTAL) BY MOUTH EVERY SEVEN   DAYS. TAKE WITH A FULL GLASS OF WATER ON AT NOON TIME   EMPTY STOMACH.    Dispense:  4 tablet    Refill:  5  . levothyroxine (SYNTHROID, LEVOTHROID) 75 MCG tablet    Sig: Take 1 tablet (75 mcg total) by mouth daily.    Dispense:  30 tablet    Refill:  5  . zolpidem (AMBIEN) 10 MG tablet    Sig: Take 1/2 - 1 tablet at bedtime as needed for sleep.    Dispense:  30 tablet    Refill:  2    Pt needs to schedule office visit.

## 2014-02-22 ENCOUNTER — Other Ambulatory Visit: Payer: Self-pay

## 2014-02-22 DIAGNOSIS — E039 Hypothyroidism, unspecified: Secondary | ICD-10-CM

## 2014-02-22 MED ORDER — LEVOTHYROXINE SODIUM 75 MCG PO TABS: 88.0000 ug | ORAL_TABLET | Freq: Every day | ORAL | Status: DC

## 2014-04-06 ENCOUNTER — Encounter: Payer: BC Managed Care – PPO | Admitting: Family Medicine

## 2014-05-21 ENCOUNTER — Other Ambulatory Visit: Payer: Self-pay | Admitting: Family Medicine

## 2014-05-23 ENCOUNTER — Ambulatory Visit (INDEPENDENT_AMBULATORY_CARE_PROVIDER_SITE_OTHER): Payer: BC Managed Care – PPO | Admitting: Family Medicine

## 2014-05-23 ENCOUNTER — Encounter: Payer: Self-pay | Admitting: Family Medicine

## 2014-05-23 VITALS — BP 160/70 | HR 80 | Temp 97.6°F | Resp 16 | Ht 60.25 in | Wt 98.4 lb

## 2014-05-23 DIAGNOSIS — Z0000 Encounter for general adult medical examination without abnormal findings: Secondary | ICD-10-CM

## 2014-05-23 DIAGNOSIS — D649 Anemia, unspecified: Secondary | ICD-10-CM

## 2014-05-23 DIAGNOSIS — M810 Age-related osteoporosis without current pathological fracture: Secondary | ICD-10-CM

## 2014-05-23 DIAGNOSIS — E890 Postprocedural hypothyroidism: Secondary | ICD-10-CM

## 2014-05-23 DIAGNOSIS — G4700 Insomnia, unspecified: Secondary | ICD-10-CM

## 2014-05-23 LAB — THYROID PANEL WITH TSH
Free Thyroxine Index: 4 — ABNORMAL HIGH (ref 1.0–3.9)
T3 UPTAKE: 34.8 % (ref 22.5–37.0)
T4 TOTAL: 11.5 ug/dL (ref 5.0–12.5)
TSH: 8.582 u[IU]/mL — ABNORMAL HIGH (ref 0.350–4.500)

## 2014-05-23 LAB — COMPLETE METABOLIC PANEL WITH GFR
ALT: 15 U/L (ref 0–35)
AST: 25 U/L (ref 0–37)
Albumin: 4.2 g/dL (ref 3.5–5.2)
Alkaline Phosphatase: 53 U/L (ref 39–117)
BUN: 19 mg/dL (ref 6–23)
CALCIUM: 8.3 mg/dL — AB (ref 8.4–10.5)
CHLORIDE: 102 meq/L (ref 96–112)
CO2: 24 meq/L (ref 19–32)
CREATININE: 0.74 mg/dL (ref 0.50–1.10)
GFR, EST AFRICAN AMERICAN: 85 mL/min
GFR, EST NON AFRICAN AMERICAN: 74 mL/min
GLUCOSE: 93 mg/dL (ref 70–99)
Potassium: 4 mEq/L (ref 3.5–5.3)
Sodium: 138 mEq/L (ref 135–145)
Total Bilirubin: 0.5 mg/dL (ref 0.2–1.2)
Total Protein: 6.6 g/dL (ref 6.0–8.3)

## 2014-05-23 LAB — CBC WITH DIFFERENTIAL/PLATELET
Basophils Absolute: 0 10*3/uL (ref 0.0–0.1)
Basophils Relative: 0 % (ref 0–1)
Eosinophils Absolute: 0.1 10*3/uL (ref 0.0–0.7)
Eosinophils Relative: 1 % (ref 0–5)
HEMATOCRIT: 38.4 % (ref 36.0–46.0)
Hemoglobin: 12.7 g/dL (ref 12.0–15.0)
LYMPHS ABS: 2 10*3/uL (ref 0.7–4.0)
LYMPHS PCT: 29 % (ref 12–46)
MCH: 29 pg (ref 26.0–34.0)
MCHC: 33.1 g/dL (ref 30.0–36.0)
MCV: 87.7 fL (ref 78.0–100.0)
MONO ABS: 0.6 10*3/uL (ref 0.1–1.0)
MONOS PCT: 8 % (ref 3–12)
Neutro Abs: 4.3 10*3/uL (ref 1.7–7.7)
Neutrophils Relative %: 62 % (ref 43–77)
Platelets: 183 10*3/uL (ref 150–400)
RBC: 4.38 MIL/uL (ref 3.87–5.11)
RDW: 14.7 % (ref 11.5–15.5)
WBC: 6.9 10*3/uL (ref 4.0–10.5)

## 2014-05-23 MED ORDER — LEVOTHYROXINE SODIUM 75 MCG PO TABS: 88.0000 ug | ORAL_TABLET | Freq: Every day | ORAL | Status: DC

## 2014-05-23 MED ORDER — ZOLPIDEM TARTRATE 10 MG PO TABS: ORAL_TABLET | ORAL | Status: DC

## 2014-05-23 NOTE — Progress Notes (Signed)
Subjective:    Patient ID: Patricia Campbell, female    DOB: [birth year], 80 y.o.   MRN: [id number]  HPI  This 80 y.o. 80 female is here for [facility] Subsequent CPE. She is accompanied by sister. Pt lives alone and has chronic insomnia ("I am a light sleeper"). She feels well except for occasional loose stools, associated w/ certain foods. This GI problem occurs < 1x/month nad is associated with minimal cramping. Pt denies BRBPR or melena. Pt prepares all meal  and accomplishes ADLs w/o diffculty.  HCM: CRS- Not recently.           IMM- Pt declines all vaccines.           Vision- Due this year.  Patient Active Problem List   Diagnosis Date Noted  . Idiopathic scoliosis 03/03/2013  . Schatzki's ring 12/12/2012  . Hypothyroidism 09/16/2012  . Osteoporosis 09/16/2012   Prior to Admission medications   Medication Sig Start Date End Date Taking? Authorizing Provider  alendronate (FOSAMAX) 70 MG tablet TAKE 1 TABLET (70 MG TOTAL) BY MOUTH EVERY SEVEN   DAYS. TAKE WITH A FULL GLASS OF WATER ON AT NOON TIME   EMPTY STOMACH.   Yes Heather M Marte, PA-C  Cyanocobalamin (VITAMIN B 12 PO) Take by mouth daily.   Yes Historical Provider, MD  levothyroxine (SYNTHROID, LEVOTHROID) 75 MCG tablet Take 1 tablet (75 mcg total) by mouth daily.   Yes Barton Fanny, MD  Multiple Vitamin (MULTIVITAMIN) tablet Take 1 tablet by mouth daily.   Yes Historical Provider, MD  OVER THE COUNTER MEDICATION Focus Factor taking one daily   Yes Historical Provider, MD  OVER THE COUNTER MEDICATION Vitamin E 400 iu daily   Yes Historical Provider, MD  OVER THE COUNTER MEDICATION Alfalfa 500 mg taking  2 a day   Yes Historical Provider, MD  OVER THE COUNTER MEDICATION Co Q 10 100 mg taking one daily   Yes Historical Provider, MD  OVER THE COUNTER MEDICATION D 3 2000 iu taking daily   Yes Historical Provider, MD  OVER THE COUNTER MEDICATION Vitamin C 500 mg prn   Yes Historical Provider, MD  OVER THE COUNTER MEDICATION    Yes  Historical Provider, MD  zolpidem (AMBIEN) 10 MG tablet Take 1/2 - 1 tablet at bedtime as needed for sleep.   Yes Barton Fanny, MD  OVER THE COUNTER MEDICATION Lutein 25 mg plus Zenxanthin 5 mg taking daily    Historical Provider, MD  OVER THE COUNTER MEDICATION Sublingual B 12 ( B 6 + Folic acid  ) taking daily    Historical Provider, MD  OVER THE COUNTER MEDICATION Flaxseed Oil 1000 mg    Historical Provider, MD    Review of Systems  Constitutional: Negative.   HENT: Negative.   Eyes: Positive for visual disturbance.       Wears corrective lenses.  Respiratory: Negative.   Cardiovascular: Negative.   Gastrointestinal: Negative for nausea, vomiting, abdominal pain, constipation, blood in stool, abdominal distention and anal bleeding.  Endocrine: Negative.   Genitourinary: Negative.   Musculoskeletal: Positive for arthralgias and back pain. Negative for gait problem.       Has scoliosis and T-spine/ L-spine DDD.  Skin: Negative.   Allergic/Immunologic: Negative.   Neurological: Negative.   Hematological: Negative.   Psychiatric/Behavioral: Positive for sleep disturbance.      Objective:   Physical Exam  Vitals reviewed. Constitutional: She is oriented to person, place, and time. Vital signs are  normal. She appears well-developed and well-nourished. No distress.  HENT:  Head: Normocephalic and atraumatic.  Right Ear: Hearing, external ear and ear canal normal.  Left Ear: Hearing, external ear and ear canal normal.  Nose: No nasal deformity or septal deviation.  Mouth/Throat: Uvula is midline, oropharynx is clear and moist and mucous membranes are normal. No oral lesions. Normal dentition.  Eyes: Conjunctivae and lids are normal. No scleral icterus.  Dysconjugate gaze (this is baseline).  Neck: Trachea normal, normal range of motion and full passive range of motion without pain. Neck supple. No JVD present. No spinous process tenderness and no muscular tenderness present.  Carotid bruit is not present. No mass and no thyromegaly present.  Cardiovascular: Normal rate, regular rhythm, S1 normal, S2 normal, normal heart sounds, intact distal pulses and normal pulses.   No extrasystoles are present. PMI is not displaced.  Exam reveals no gallop and no friction rub.   No murmur heard. Pulmonary/Chest: Effort normal and breath sounds normal. No respiratory distress. She has no decreased breath sounds.  Abdominal: Soft. Normal appearance and bowel sounds are normal. She exhibits no distension, no abdominal bruit, no pulsatile midline mass and no mass. There is no hepatosplenomegaly. There is no tenderness. There is no CVA tenderness. No hernia.  Very mild LLQ tenderness w/ moderate palpation.  Genitourinary:  Deferred.  Musculoskeletal:       Cervical back: Normal.       Thoracic back: She exhibits deformity. She exhibits normal range of motion, no tenderness, no pain and no spasm.       Lumbar back: She exhibits deformity. She exhibits no tenderness, no bony tenderness, no pain and no spasm.       Right hand: She exhibits deformity. She exhibits no tenderness, no bony tenderness and no swelling. Normal sensation noted. Normal strength noted.       Left hand: She exhibits deformity. She exhibits no tenderness, no bony tenderness and no swelling. Normal sensation noted. Normal strength noted.  T-spine with severe scoliosis curve to left. Hands- Heberden's nodes on all digits. Good ROM in all major joints; mild degenerative changes in shoulder, knee and ankle joints.  Lymphadenopathy:       Head (right side): No submental, no submandibular, no tonsillar, no posterior auricular and no occipital adenopathy present.       Head (left side): No submental, no submandibular, no tonsillar, no posterior auricular and no occipital adenopathy present.    She has no cervical adenopathy.       Right: No supraclavicular adenopathy present.       Left: No supraclavicular adenopathy  present.  Neurological: She is alert and oriented to person, place, and time. She has normal strength. She displays no atrophy and no tremor. No cranial nerve deficit or sensory deficit. She exhibits normal muscle tone. She displays a negative Romberg sign. Coordination normal.  Gait - mildly antalgic. Get Up and Go test: < 10 seconds though gait is a little slowed.  Skin: Skin is dry and intact. No ecchymosis, no lesion and no rash noted. She is not diaphoretic. No cyanosis or erythema. No pallor. Nails show no clubbing.  Psychiatric: Her speech is normal and behavior is normal. Judgment and thought content normal. Cognition and memory are normal.  Flat affect.      Assessment & Plan:  Routine general medical examination at a health care facility - Plan: COMPLETE METABOLIC PANEL WITH GFR  Osteoporosis - Plan: DG Bone Density due in Dec  2015.  Postoperative hypothyroidism - Plan: CBC with Differential, COMPLETE METABOLIC PANEL WITH GFR, Thyroid Panel With TSH, levothyroxine (SYNTHROID, LEVOTHROID) 75 MCG tablet  Anemia, unspecified - Plan: CBC with Differential, Thyroid Panel With TSH  Insomnia - Plan: zolpidem (AMBIEN) 10 MG tablet  Meds ordered this encounter  . levothyroxine (SYNTHROID, LEVOTHROID) 75 MCG tablet    Sig: Take 1 tablet (75 mcg total) by mouth daily.    Dispense:  30 tablet    Refill:  5  . zolpidem (AMBIEN) 10 MG tablet    Sig: Take 1/2 - 1 tablet at bedtime as needed for sleep.    Dispense:  30 tablet    Refill:  5

## 2014-05-23 NOTE — Patient Instructions (Signed)
Insomnia Insomnia is frequent trouble falling and/or staying asleep. Insomnia can be a long term problem or a short term problem. Both are common. Insomnia can be a short term problem when the wakefulness is related to a certain stress or worry. Long term insomnia is often related to ongoing stress during waking hours and/or poor sleeping habits. Overtime, sleep deprivation itself can make the problem worse. Every little thing feels more severe because you are overtired and your ability to cope is decreased. CAUSES   Stress, anxiety, and depression.  Poor sleeping habits.  Distractions such as TV in the bedroom.  Naps close to bedtime.  Engaging in emotionally charged conversations before bed.  Technical reading before sleep.  Alcohol and other sedatives. They may make the problem worse. They can hurt normal sleep patterns and normal dream activity.  Stimulants such as caffeine for several hours prior to bedtime.  Pain syndromes and shortness of breath can cause insomnia.  Exercise late at night.  Changing time zones may cause sleeping problems (jet lag). It is sometimes helpful to have someone observe your sleeping patterns. They should look for periods of not breathing during the night (sleep apnea). They should also look to see how long those periods last. If you live alone or observers are uncertain, you can also be observed at a sleep clinic where your sleep patterns will be professionally monitored. Sleep apnea requires a checkup and treatment. Give your caregivers your medical history. Give your caregivers observations your family has made about your sleep.  SYMPTOMS   Not feeling rested in the morning.  Anxiety and restlessness at bedtime.  Difficulty falling and staying asleep. TREATMENT   Your caregiver may prescribe treatment for an underlying medical disorders. Your caregiver can give advice or help if you are using alcohol or other drugs for self-medication. Treatment  of underlying problems will usually eliminate insomnia problems.  Medications can be prescribed for short time use. They are generally not recommended for lengthy use.  Over-the-counter sleep medicines are not recommended for lengthy use. They can be habit forming.  You can promote easier sleeping by making lifestyle changes such as:  Using relaxation techniques that help with breathing and reduce muscle tension.  Exercising earlier in the day.  Changing your diet and the time of your last meal. No night time snacks.  Establish a regular time to go to bed.  Counseling can help with stressful problems and worry.  Soothing music and white noise may be helpful if there are background noises you cannot remove.  Stop tedious detailed work at least one hour before bedtime. HOME CARE INSTRUCTIONS   Keep a diary. Inform your caregiver about your progress. This includes any medication side effects. See your caregiver regularly. Take note of:  Times when you are asleep.  Times when you are awake during the night.  The quality of your sleep.  How you feel the next day. This information will help your caregiver care for you.  Get out of bed if you are still awake after 15 minutes. Read or do some quiet activity. Keep the lights down. Wait until you feel sleepy and go back to bed.  Keep regular sleeping and waking hours. Avoid naps.  Exercise regularly.  Avoid distractions at bedtime. Distractions include watching television or engaging in any intense or detailed activity like attempting to balance the household checkbook.  Develop a bedtime ritual. Keep a familiar routine of bathing, brushing your teeth, climbing into bed at the same   time each night, listening to soothing music. Routines increase the success of falling to sleep faster.  Use relaxation techniques. This can be using breathing and muscle tension release routines. It can also include visualizing peaceful scenes. You can  also help control troubling or intruding thoughts by keeping your mind occupied with boring or repetitive thoughts like the old concept of counting sheep. You can make it more creative like imagining planting one beautiful flower after another in your backyard garden.  During your day, work to eliminate stress. When this is not possible use some of the previous suggestions to help reduce the anxiety that accompanies stressful situations. MAKE SURE YOU:   Understand these instructions.  Will watch your condition.  Will get help right away if you are not doing well or get worse. Document Released: 10/23/2000 Document Revised: 01/18/2012 Document Reviewed: 11/23/2007 [facility] Patient Information 2015 [place], [place]. This information is not intended to replace advice given to you by your health care provider. Make sure you discuss any questions you have with your health care provider.    Diet and Irritable Bowel Syndrome  No cure has been found for irritable bowel syndrome (IBS). Many options are available to treat the symptoms. Your caregiver will give you the best treatments available for your symptoms. He or she will also encourage you to manage stress and to make changes to your diet. You need to work with your caregiver and Registered Dietician to find the best combination of medicine, diet, counseling, and support to control your symptoms. The following are some diet suggestions. FOODS THAT MAKE IBS WORSE  Fatty foods, such as [place] fries.  Milk products, such as cheese or ice cream.  Chocolate.  Alcohol.  Caffeine (found in coffee and some sodas).  Carbonated drinks, such as soda. If certain foods cause symptoms, you should eat less of them or stop eating them. FOOD JOURNAL   Keep a journal of the foods that seem to cause distress. Write down:  What you are eating during the day and when.  What problems you are having after eating.  When the symptoms occur in relation to  your meals.  What foods always make you feel badly.  Take your notes with you to your caregiver to see if you should stop eating certain foods. FOODS THAT MAKE IBS BETTER Fiber reduces IBS symptoms, especially constipation, because it makes stools soft, bulky, and easier to pass. Fiber is found in bran, bread, cereal, beans, fruit, and vegetables. Examples of foods with fiber include:  Apples.  Peaches.  Pears.  Berries.  Figs.  Broccoli, raw.  Cabbage.  Carrots.  Raw peas.  Kidney beans.  Lima beans.  Whole-grain bread.  Whole-grain cereal. Add foods with fiber to your diet a little at a time. This will let your body get used to them. Too much fiber at once might cause gas and swelling of your abdomen. This can trigger symptoms in a person with IBS. Caregivers usually recommend a diet with enough fiber to produce soft, painless bowel movements. High fiber diets may cause gas and bloating. However, these symptoms often go away within a few weeks, as your body adjusts. In many cases, dietary fiber may lessen IBS symptoms, particularly constipation. However, it may not help pain or diarrhea. High fiber diets keep the colon mildly enlarged (distended) with the added fiber. This may help prevent spasms in the colon. Some forms of fiber also keep water in the stool, thereby preventing hard stools that are  difficult to pass.  Besides telling you to eat more foods with fiber, your caregiver may also tell you to get more fiber by taking a fiber pill or drinking water mixed with a special high fiber powder. An example of this is a natural fiber laxative containing psyllium seed.  TIPS  Large meals can cause cramping and diarrhea in people with IBS. If this happens to you, try eating 4 or 5 small meals a day, or try eating less at each of your usual 3 meals. It may also help if your meals are low in fat and high in carbohydrates. Examples of carbohydrates are pasta, rice, whole-grain  breads and cereals, fruits, and vegetables.  If dairy products cause your symptoms to flare up, you can try eating less of those foods. You might be able to handle yogurt better than other dairy products, because it contains bacteria that helps with digestion. Dairy products are an important source of calcium and other nutrients. If you need to avoid dairy products, be sure to talk with a Registered Dietitian about getting these nutrients through other food sources.  Drink enough water and fluids to keep your urine clear or pale yellow. This is important, especially if you have diarrhea. FOR MORE INFORMATION  International Foundation for Functional Gastrointestinal Disorders: www.iffgd.org  National Digestive Diseases Information Clearinghouse: digestive.AmenCredit.is Document Released: 01/16/2004 Document Revised: 01/18/2012 Document Reviewed: 10/03/2007 [facility] Patient Information 2015 [place], [place]. This information is not intended to replace advice given to you by your health care provider. Make sure you discuss any questions you have with your health care provider.

## 2014-05-24 ENCOUNTER — Other Ambulatory Visit: Payer: Self-pay | Admitting: Family Medicine

## 2014-05-24 DIAGNOSIS — E890 Postprocedural hypothyroidism: Secondary | ICD-10-CM

## 2014-05-24 MED ORDER — LEVOTHYROXINE SODIUM 88 MCG PO TABS: 88.0000 ug | ORAL_TABLET | Freq: Every day | ORAL | Status: DC

## 2014-05-24 NOTE — Progress Notes (Signed)
Quick Note:  Please advise pt regarding following labs... All labs look good except thyroid results indicate that you need to be taking a higher dose of thyroid medication.  I am increasing your dose to 88 mcg 1 tablet daily; the medication change has been sent to your pharmacy. Take this pill every morning by itself on an empty stomach, 30 minutes before you eat anything.  Your thyroid numbers should be rechecked in ~ 8 weeks.  Contact the clinic if you have any questions or concerns.  Copy to pt. ______

## 2014-05-25 ENCOUNTER — Telehealth: Payer: Self-pay | Admitting: Radiology

## 2014-05-25 ENCOUNTER — Other Ambulatory Visit (INDEPENDENT_AMBULATORY_CARE_PROVIDER_SITE_OTHER): Payer: BC Managed Care – PPO | Admitting: Radiology

## 2014-05-25 ENCOUNTER — Other Ambulatory Visit: Payer: Self-pay | Admitting: Family Medicine

## 2014-05-25 DIAGNOSIS — R350 Frequency of micturition: Secondary | ICD-10-CM

## 2014-05-25 LAB — POCT URINALYSIS DIPSTICK
BILIRUBIN UA: NEGATIVE
Glucose, UA: NEGATIVE
Ketones, UA: NEGATIVE
NITRITE UA: NEGATIVE
PROTEIN UA: NEGATIVE
Spec Grav, UA: 1.015
Urobilinogen, UA: 0.2
pH, UA: 7

## 2014-05-25 LAB — POCT UA - MICROSCOPIC ONLY
CASTS, UR, LPF, POC: NEGATIVE
CRYSTALS, UR, HPF, POC: NEGATIVE
Epithelial cells, urine per micros: NEGATIVE
Mucus, UA: POSITIVE
YEAST UA: NEGATIVE

## 2014-05-25 MED ORDER — CEFUROXIME AXETIL 250 MG PO TABS: 250.0000 mg | ORAL_TABLET | Freq: Two times a day (BID) | ORAL | Status: DC

## 2014-05-25 NOTE — Telephone Encounter (Signed)
05/25/2014 2:04 PM Patricia Fanny, MD [id number] Clinical Message Pool Patient Calls Comment: Notify pt/ sister that I prescribed an antibiotic to be taken twice a day for 10 days until we get the urine culture results back. Thanks  Called to notify. Niece Olin Hauser advised.

## 2014-05-29 LAB — URINE CULTURE

## 2014-07-13 ENCOUNTER — Encounter: Payer: Self-pay | Admitting: Family Medicine

## 2014-07-13 ENCOUNTER — Ambulatory Visit (INDEPENDENT_AMBULATORY_CARE_PROVIDER_SITE_OTHER): Payer: MEDICARE | Admitting: Family Medicine

## 2014-07-13 VITALS — BP 130/70 | HR 82 | Temp 98.1°F | Resp 16 | Wt 101.0 lb

## 2014-07-13 DIAGNOSIS — E039 Hypothyroidism, unspecified: Secondary | ICD-10-CM

## 2014-07-13 LAB — THYROID PANEL WITH TSH
FREE THYROXINE INDEX: 2.7 (ref 1.4–3.8)
T3 Uptake: 31 % (ref 22.0–35.0)
T4, Total: 8.8 ug/dL (ref 4.5–12.0)
TSH: 6.02 u[IU]/mL — ABNORMAL HIGH (ref 0.350–4.500)

## 2014-07-13 NOTE — Progress Notes (Signed)
S:  This 80 y.o. Cauc female has hypothyroidism and is here for labs. She is compliant w/ medication; her main compliant today is LBP. This is a chronic problem, managed w/ a back brace, which pt is not wearing today. Her companion/sister reports that pt has occasional diarrhea; pt eats a lot of bananas and squash and onions. Her fiber content is limited; sister just started her on probiotics.   Pt denies fatigue, weight loss, anorexia, vision disturbance, CP or tightness, palpitations, SOB, cough, constipation, myalgias, HA, dizziness, numbness, weakness or syncope. She has no confusion or behavior changes.  Patient Active Problem List   Diagnosis Date Noted  . Idiopathic scoliosis 03/03/2013  . Schatzki's ring 12/12/2012  . Hypothyroidism 09/16/2012  . Osteoporosis 09/16/2012    Prior to Admission medications   Medication Sig Start Date End Date Taking? Authorizing Provider  alendronate (FOSAMAX) 70 MG tablet TAKE 1 TABLET (70 MG TOTAL) BY MOUTH EVERY SEVEN   DAYS. TAKE WITH A FULL GLASS OF WATER ON AT NOON TIME   EMPTY STOMACH. 05/21/14  Yes Collene Leyden, PA-C  cefUROXime (CEFTIN) 250 MG tablet Take 1 tablet (250 mg total) by mouth 2 (two) times daily with a meal. 05/25/14  Yes Barton Fanny, MD  Cyanocobalamin (VITAMIN B 12 PO) Take by mouth daily.   Yes Historical Provider, MD  levothyroxine (SYNTHROID, LEVOTHROID) 88 MCG tablet Take 1 tablet (88 mcg total) by mouth daily. 05/24/14  Yes Barton Fanny, MD  Multiple Vitamin (MULTIVITAMIN) tablet Take 1 tablet by mouth daily.   Yes Historical Provider, MD  OVER THE COUNTER MEDICATION Lutein 25 mg plus Zenxanthin 5 mg taking daily   Yes Historical Provider, MD  OVER THE COUNTER MEDICATION Focus Factor taking one daily   Yes Historical Provider, MD  OVER THE COUNTER MEDICATION Vitamin E 400 iu daily   Yes Historical Provider, MD  OVER THE COUNTER MEDICATION Alfalfa 500 mg taking  2 a day   Yes Historical Provider, MD  OVER THE  COUNTER MEDICATION D 3 2000 iu taking daily   Yes Historical Provider, MD  OVER THE COUNTER MEDICATION Vitamin C 500 mg prn   Yes Historical Provider, MD  OVER THE COUNTER MEDICATION Flaxseed Oil 1000 mg   Yes Historical Provider, MD  OVER THE COUNTER MEDICATION    Yes Historical Provider, MD  zolpidem (AMBIEN) 10 MG tablet Take 1/2 - 1 tablet at bedtime as needed for sleep. 05/23/14  Yes Barton Fanny, MD  OVER THE COUNTER MEDICATION Sublingual B 12 ( B 6 + Folic acid  ) taking daily    Historical Provider, MD  OVER THE COUNTER MEDICATION Co Q 10 100 mg taking one daily    Historical Provider, MD    History   Social History  . Marital Status: Married    Spouse Name: N/A    Number of Children: N/A  . Years of Education: N/A   Occupational History  . Not on file.   Social History Main Topics  . Smoking status: Never Smoker   . Smokeless tobacco: Not on file  . Alcohol Use: No  . Drug Use: No  . Sexual Activity: No   Other Topics Concern  . Not on file   Social History Narrative  . No narrative on file    ROS: As per HPI.  O: Filed Vitals:   07/13/14 1131  BP: 130/70  Pulse: 82  Temp: 98.1 F (36.7 C)  Resp: 16  GEN: In NAD; WN,WD. HENT: [place]/AT; EOMI but dysconjugate gaze. Otherwise unremarkable. COR: RRR. LUNGS: Normal resp rate. SKIN: W&D; intact w/o erythema, rash or pallor. NEURO: A&O x 3; CNs intact. DTRs 1-2+/=.  Motor function is good considering pt's age. Gait is normal though slightly wide-based and pt stooped forward a little.  A/P: Hypothyroidism, unspecified hypothyroidism type - Plan: Thyroid Panel With TSH Continue same dose of medication pending labs.

## 2014-07-13 NOTE — Patient Instructions (Signed)

## 2014-07-16 ENCOUNTER — Other Ambulatory Visit: Payer: Self-pay | Admitting: Family Medicine

## 2014-07-16 DIAGNOSIS — E039 Hypothyroidism, unspecified: Secondary | ICD-10-CM

## 2014-07-16 NOTE — Progress Notes (Signed)
Quick Note:  Please advise pt regarding following labs... Thyroid test results look better but need to be rechecked in ~ 2-3 months. I will order the lab tests and you need to come to 104 during the month of November to have thyroid tests drawn. You do not have to be fasting. Be sure you are taking your thyroid medication every morning on an empty stomach.  Contact the clinic if you have questions or concerns.  Copy to pt. ______

## 2014-08-25 ENCOUNTER — Other Ambulatory Visit: Payer: Self-pay | Admitting: *Deleted

## 2014-08-25 DIAGNOSIS — E890 Postprocedural hypothyroidism: Secondary | ICD-10-CM

## 2014-08-25 MED ORDER — LEVOTHYROXINE SODIUM 88 MCG PO TABS: 88.0000 ug | ORAL_TABLET | Freq: Every day | ORAL | Status: DC

## 2014-08-25 NOTE — Telephone Encounter (Signed)
Pt sister called and pt has been out of Synthroid for a few days now. Her [employer] is closed until Monday. Sister asked for a small amount to be called into [facility] [address] until she is able to get the regular refill. Printmaker.

## 2014-09-16 ENCOUNTER — Emergency Department (HOSPITAL_COMMUNITY)
Admission: EM | Admit: 2014-09-16 | Discharge: 2014-09-16 | Disposition: A | Discharge status: Home / Self Care | Payer: MEDICARE | Attending: Emergency Medicine | Admitting: Emergency Medicine

## 2014-09-16 ENCOUNTER — Encounter (HOSPITAL_COMMUNITY): Payer: Self-pay

## 2014-09-16 DIAGNOSIS — Y9289 Other specified places as the place of occurrence of the external cause: Secondary | ICD-10-CM | POA: Insufficient documentation

## 2014-09-16 DIAGNOSIS — Z8639 Personal history of other endocrine, nutritional and metabolic disease: Secondary | ICD-10-CM | POA: Insufficient documentation

## 2014-09-16 DIAGNOSIS — T426X1A Poisoning by other antiepileptic and sedative-hypnotic drugs, accidental (unintentional), initial encounter: Secondary | ICD-10-CM | POA: Insufficient documentation

## 2014-09-16 DIAGNOSIS — X58XXXA Exposure to other specified factors, initial encounter: Secondary | ICD-10-CM | POA: Diagnosis not present

## 2014-09-16 DIAGNOSIS — Z859 Personal history of malignant neoplasm, unspecified: Secondary | ICD-10-CM | POA: Diagnosis not present

## 2014-09-16 DIAGNOSIS — Y9389 Activity, other specified: Secondary | ICD-10-CM | POA: Diagnosis not present

## 2014-09-16 DIAGNOSIS — Z79899 Other long term (current) drug therapy: Secondary | ICD-10-CM | POA: Diagnosis not present

## 2014-09-16 NOTE — ED Notes (Addendum)
Pt from home, accidentally took 2 ambien tablets (night-time med) instead of her synthroid (day-time med.) Pt alert and oriented on arrival.

## 2014-09-16 NOTE — ED Provider Notes (Signed)
CSN: [id number]     Arrival date & time 09/16/14  8182 History   First MD Initiated Contact with Patient 09/16/14 0830     Chief Complaint  Patient presents with  . Drug Overdose     (Consider location/radiation/quality/duration/timing/severity/associated sxs/prior Treatment) HPI Comments: This is an 80 y/o female with a PMHx of thyroid disease who presents to the ED from home with her neighbor after taking an additional Ambien. Pt reports she took her scheduled Ambien at 9:00 PM last night, and this morning between 5:00-6:00 AM she accidentally took another Ambien thinking it was her synthroid. When she realized what she did, she took her synthroid and called her neighbor because she was not sure what would happen. Initially she was feeling a little lightheaded and groggy, however now feels fine. Denies chest pain, sob, n/v, abdominal pain, HA, vision changes, confusion.  Patient is a 80 y.o. female presenting with Overdose. The history is provided by the patient.  Drug Overdose    Past Medical History  Diagnosis Date  . Cancer     Thyroid cancer >> Thyroidectomy.  . Thyroid disease    Past Surgical History  Procedure Laterality Date  . Total thyroidectomy      Pt reports hx of thyroid cancer.   History reviewed. No pertinent family history. History  Substance Use Topics  . Smoking status: Never Smoker   . Smokeless tobacco: Not on file  . Alcohol Use: No   OB History    No data available     Review of Systems  10 Systems reviewed and are negative for acute change except as noted in the HPI.   Allergies  Codeine  Home Medications   Prior to Admission medications   Medication Sig Start Date End Date Taking? Authorizing Provider  alendronate (FOSAMAX) 70 MG tablet TAKE 1 TABLET (70 MG TOTAL) BY MOUTH EVERY SEVEN   DAYS. TAKE WITH A FULL GLASS OF WATER ON AT NOON TIME   EMPTY STOMACH. 05/21/14   Collene Leyden, PA-C  cefUROXime (CEFTIN) 250 MG tablet Take 1 tablet  (250 mg total) by mouth 2 (two) times daily with a meal. 05/25/14   Barton Fanny, MD  Cyanocobalamin (VITAMIN B 12 PO) Take by mouth daily.    Historical Provider, MD  levothyroxine (SYNTHROID, LEVOTHROID) 88 MCG tablet Take 1 tablet (88 mcg total) by mouth daily. 08/25/14   Chelle Janalee Dane, PA-C  Multiple Vitamin (MULTIVITAMIN) tablet Take 1 tablet by mouth daily.    Historical Provider, MD  OVER THE COUNTER MEDICATION Lutein 25 mg plus Zenxanthin 5 mg taking daily    Historical Provider, MD  OVER THE COUNTER MEDICATION Focus Factor taking one daily    Historical Provider, MD  OVER THE COUNTER MEDICATION Vitamin E 400 iu daily    Historical Provider, MD  OVER THE COUNTER MEDICATION Sublingual B 12 ( B 6 + Folic acid  ) taking daily    Historical Provider, MD  OVER THE COUNTER MEDICATION Alfalfa 500 mg taking  2 a day    Historical Provider, MD  OVER THE COUNTER MEDICATION Co Q 10 100 mg taking one daily    Historical Provider, MD  OVER THE COUNTER MEDICATION D 3 2000 iu taking daily    Historical Provider, MD  OVER THE COUNTER MEDICATION Vitamin C 500 mg prn    Historical Provider, MD  OVER THE COUNTER MEDICATION Flaxseed Oil 1000 mg    Historical Provider, MD  OVER THE COUNTER  MEDICATION     Historical Provider, MD  zolpidem (AMBIEN) 10 MG tablet Take 1/2 - 1 tablet at bedtime as needed for sleep. 05/23/14   Barton Fanny, MD   BP 185/80 mmHg  Temp(Src) 97.7 F (36.5 C) (Oral)  Resp 17  SpO2 99% Physical Exam  Constitutional: She is oriented to person, place, and time. She appears well-developed and well-nourished. No distress.  HENT:  Head: Normocephalic and atraumatic.  Mouth/Throat: Oropharynx is clear and moist.  Eyes: Conjunctivae and EOM are normal. Pupils are equal, round, and reactive to light.  Neck: Normal range of motion. Neck supple. No JVD present.  Cardiovascular: Normal rate, regular rhythm, normal heart sounds and intact distal pulses.   No extremity edema.   Pulmonary/Chest: Effort normal and breath sounds normal. No respiratory distress.  Abdominal: Soft. Bowel sounds are normal. There is no tenderness.  Musculoskeletal: Normal range of motion. She exhibits no edema.  Neurological: She is alert and oriented to person, place, and time. She has normal strength. No sensory deficit.  Speech fluent, goal oriented. Moves limbs without ataxia. Equal grip strength bilateral.  Skin: Skin is warm and dry. She is not diaphoretic.  Psychiatric: She has a normal mood and affect. Her behavior is normal.  Nursing note and vitals reviewed.   ED Course  Procedures (including critical care time) Labs Review Labs Reviewed - No data to display  Imaging Review No results found.   EKG Interpretation None      MDM   Final diagnoses:  Ambien accidental overdose, initial encounter   Patient presenting after accidentally taking 1 extra Ambien between 5 and 6 AM today. She is nontoxic appearing and in no apparent distress. Vital signs stable. No respiratory distress. Asymptomatic. Her neighbor is present with her in the room who states he will check on her throughout the day. AAOx3. No further workup needed. Stable for d/c. Return precautions given. Patient states understanding of treatment care plan and is agreeable. Discussed with attending Dr. Aline Brochure who also evaluated patient and agrees with plan of care.   Carman Ching, PA-C 09/16/14 1031  Pamella Pert, MD 09/17/14 1122

## 2014-09-16 NOTE — Discharge Instructions (Signed)
Accidental Overdose °A drug overdose occurs when a chemical substance (drug or medication) is used in amounts large enough to overcome a person. This may result in severe illness or death. This is a type of poisoning. Accidental overdoses of medications or other substances come from a variety of reasons. When this happens accidentally, it is often because the person taking the substance does not know enough about what they have taken. Drugs which commonly cause overdose deaths are alcohol, psychotropic medications (medications which affect the mind), pain medications, illegal drugs (street drugs) such as cocaine and heroin, and multiple drugs taken at the same time. It may result from careless behavior (such as over-indulging at a party). Other causes of overdose may include multiple drug use, a lapse in memory, or drug use after a period of no drug use.  °Sometimes overdosing occurs because a person cannot remember if they have taken their medication.  °A common unintentional overdose in young children involves multi-vitamins containing iron. Iron is a part of the hemoglobin molecule in blood. It is used to transport oxygen to living cells. When taken in small amounts, iron allows the body to restock hemoglobin. In large amounts, it causes problems in the body. If this overdose is not treated, it can lead to death. °Never take medicines that show signs of tampering or do not seem quite right. Never take medicines in the dark or in poor lighting. Read the label and check each dose of medicine before you take it. When adults are poisoned, it happens most often through carelessness or lack of information. Taking medicines in the dark or taking medicine prescribed for someone else to treat the same type of problem is a dangerous practice. °SYMPTOMS  °Symptoms of overdose depend on the medication and amount taken. They can vary from over-activity with stimulant over-dosage, to sleepiness from depressants such as  alcohol, narcotics and tranquilizers. Confusion, dizziness, nausea and vomiting may be present. If problems are severe enough coma and death may result. °DIAGNOSIS  °Diagnosis and management are generally straightforward if the drug is known. Otherwise it is more difficult. At times, certain symptoms and signs exhibited by the patient, or blood tests, can reveal the drug in question.  °TREATMENT  °In an emergency department, most patients can be treated with supportive measures. Antidotes may be available if there has been an overdose of opioids or benzodiazepines. A rapid improvement will often occur if this is the cause of overdose. °At home or away from medical care: °· There may be no immediate problems or warning signs in children. °· Not everything works well in all cases of poisoning. °· Take immediate action. Poisons may act quickly. °· If you think someone has swallowed medicine or a household product, and the person is unconscious, having seizures (convulsions), or is not breathing, immediately call for an ambulance. °IF a person is conscious and appears to be doing OK but has swallowed a poison: °· Do not wait to see what effect the poison will have. Immediately call a poison control center (listed in the white pages of your telephone book under "Poison Control" or inside the front cover with other emergency numbers). Some poison control centers have TTY capability for the deaf. Check with your local center if you or someone in your family requires this service. °· Keep the container so you can read the label on the product for ingredients. °· Describe what, when, and how much was taken and the age and condition of the person poisoned.   Inform them if the person is vomiting, choking, drowsy, shows a change in color or temperature of skin, is conscious or unconscious, or is convulsing. °· Do not cause vomiting unless instructed by medical personnel. Do not induce vomiting or force liquids into a person who  is convulsing, unconscious, or very drowsy. °Stay calm and in control.  °· Activated charcoal also is sometimes used in certain types of poisoning and you may wish to add a supply to your emergency medicines. It is available without a prescription. Call a poison control center before using this medication. °PREVENTION  °Thousands of children die every year from unintentional poisoning. This may be from household chemicals, poisoning from carbon monoxide in a car, taking their parent's medications, or simply taking a few iron pills or vitamins with iron. Poisoning comes from unexpected sources. °· Store medicines out of the sight and reach of children, preferably in a locked cabinet. Do not keep medications in a food cabinet. Always store your medicines in a secure place. Get rid of expired medications. °· If you have children living with you or have them as occasional guests, you should have child-resistant caps on your medicine containers. Keep everything out of reach. Child proof your home. °· If you are called to the telephone or to answer the door while you are taking a medicine, take the container with you or put the medicine out of the reach of small children. °· Do not take your medication in front of children. Do not tell your child how good a medication is and how good it is for them. They may get the idea it is more of a treat. °· If you are an adult and have accidentally taken an overdose, you need to consider how this happened and what can be done to prevent it from happening again. If this was from a street drug or alcohol, determine if there is a problem that needs addressing. If you are not sure a problems exists, it is easy to talk to a professional and ask them if they think you have a problem. It is better to handle this problem in this way before it happens again and has a much worse consequence. °Document Released: 01/09/2005 Document Revised: 01/18/2012 Document Reviewed: 06/17/2009 °ExitCare®  Patient Information ©2015 [employer]. This information is not intended to replace advice given to you by your health care provider. Make sure you discuss any questions you have with your health care provider. ° °Sedative Ingestion °An overdose is when more drugs are taken than recommended. The risk of serious problems from overdosing on any sedative depends on the amount of drug taken and whether it is mixed with other drugs or alcohol. The most common group of sedatives are benzodiazepines, including: °· Lorazepam. °· Flurazepam. °· Triazolam. °· Chlordiazepoxide. °· Oxazepam. °· Diazepam. °· Alprazolam. °Sedatives may be prescribed for insomnia, anxiety, muscle tension, and alcohol or drug withdrawal symptoms. °SYMPTOMS °A sedative overdose causes symptoms similar to alcohol intoxication. These include: °· Loss of coordination. °· Slurred speech. °· Slowed breathing. °· Poor judgment. °· Memory loss. °· Drowsiness. °· Blackouts. °· Coma. °Taking too many sedatives can cause: °· Respiratory depression. °· Vomiting. °· Dehydration. °· Low blood pressure. °· Death. °HOME CARE INSTRUCTIONS °· At this point, hospital care is not needed. °· You are at an increased risk for injury when on sedative drugs, especially when you drive or operate machinery. It is very important that someone watches you closely for the next 24-48 hours   and calls for emergency help if you have trouble breathing or cannot be awakened from sleep. °· You may have a hangover after sedative ingestion. Get plenty of rest and drink increased amounts of non-alcoholic fluids. °· A sedative ingestion is often a sign of a severe emotional state or depression. If you have been taking a sedative medicine regularly for a long time and stop suddenly, you may have withdrawal symptoms, including anxiety, agitation, headache, and more serious symptoms. Be sure to see your doctor or counselor for further treatment to address these emotional and physical  issues. °SEEK IMMEDIATE MEDICAL CARE IF:  °· You develop recurrent dizziness or weakness or you faint. °· You have trouble breathing. °· You have a seizure. °Document Released: 12/03/2004 Document Revised: 01/18/2012 Document Reviewed: 10/30/2009 °ExitCare® Patient Information ©2015 [employer]. This information is not intended to replace advice given to you by your health care provider. Make sure you discuss any questions you have with your health care provider. ° °

## 2014-10-03 ENCOUNTER — Ambulatory Visit (INDEPENDENT_AMBULATORY_CARE_PROVIDER_SITE_OTHER): Payer: MEDICARE | Admitting: Family Medicine

## 2014-10-03 ENCOUNTER — Ambulatory Visit (INDEPENDENT_AMBULATORY_CARE_PROVIDER_SITE_OTHER): Payer: MEDICARE

## 2014-10-03 VITALS — BP 136/80 | HR 86 | Temp 97.8°F | Resp 18 | Ht 59.0 in | Wt 103.2 lb

## 2014-10-03 DIAGNOSIS — M25562 Pain in left knee: Secondary | ICD-10-CM

## 2014-10-03 DIAGNOSIS — E039 Hypothyroidism, unspecified: Secondary | ICD-10-CM

## 2014-10-03 DIAGNOSIS — N3000 Acute cystitis without hematuria: Secondary | ICD-10-CM

## 2014-10-03 DIAGNOSIS — R3589 Other polyuria: Secondary | ICD-10-CM

## 2014-10-03 DIAGNOSIS — R358 Other polyuria: Secondary | ICD-10-CM

## 2014-10-03 DIAGNOSIS — M25462 Effusion, left knee: Secondary | ICD-10-CM

## 2014-10-03 LAB — POCT CBC
Granulocyte percent: 59.9 %G (ref 37–80)
HEMATOCRIT: 37.9 % (ref 37.7–47.9)
Hemoglobin: 11.9 g/dL — AB (ref 12.2–16.2)
LYMPH, POC: 2.5 (ref 0.6–3.4)
MCH: 28.6 pg (ref 27–31.2)
MCHC: 31.3 g/dL — AB (ref 31.8–35.4)
MCV: 91.3 fL (ref 80–97)
MID (CBC): 0.3 (ref 0–0.9)
MPV: 7.6 fL (ref 0–99.8)
POC Granulocyte: 4.1 (ref 2–6.9)
POC LYMPH %: 36.1 % (ref 10–50)
POC MID %: 4 %M (ref 0–12)
Platelet Count, POC: 202 10*3/uL (ref 142–424)
RBC: 4.15 M/uL (ref 4.04–5.48)
RDW, POC: 16.6 %
WBC: 6.8 10*3/uL (ref 4.6–10.2)

## 2014-10-03 LAB — POCT URINALYSIS DIPSTICK
Bilirubin, UA: NEGATIVE
Blood, UA: NEGATIVE
Glucose, UA: NEGATIVE
KETONES UA: NEGATIVE
Leukocytes, UA: NEGATIVE
Nitrite, UA: NEGATIVE
PROTEIN UA: NEGATIVE
SPEC GRAV UA: 1.025
Urobilinogen, UA: 0.2
pH, UA: 6

## 2014-10-03 LAB — POCT UA - MICROSCOPIC ONLY
BACTERIA, U MICROSCOPIC: NEGATIVE
Casts, Ur, LPF, POC: NEGATIVE
Crystals, Ur, HPF, POC: NEGATIVE
Mucus, UA: NEGATIVE
RBC, URINE, MICROSCOPIC: NEGATIVE
Yeast, UA: NEGATIVE

## 2014-10-03 MED ORDER — NITROFURANTOIN MONOHYD MACRO 100 MG PO CAPS: 100.0000 mg | ORAL_CAPSULE | Freq: Two times a day (BID) | ORAL | Status: DC

## 2014-10-03 MED ORDER — METHYLPREDNISOLONE ACETATE 80 MG/ML IJ SUSP
80.0000 mg | Freq: Once | INTRAMUSCULAR | Status: AC
  Administered 2014-10-03: 40 mg via INTRAMUSCULAR

## 2014-10-03 NOTE — Patient Instructions (Signed)
Take Tylenol (acetaminophen) 2 pills every 8 hours if needed for pain  Use ice on the knee tonight and tomorrow morning for about 15 or 20 minutes each time  If the knee should get worse at anytime please return. I think that by tomorrow afternoon you will find it is feeling better.  Drink lots of fluids because of the urinary infection  Take the nitrofurantoin antibiotic one twice daily for 1 week for the infected urine  Continue the same thyroid medication for now  We will let you know the results of the remainder of your labs in a few days. I am doing tests on the fluid from the knee to check for gout and some other conditions. I am also checking your thyroid blood test.  Return at anytime if needed.

## 2014-10-03 NOTE — Progress Notes (Signed)
Subjective: 80 year old lady who is here complaining of pain in her left knee. Knows of no specific injury. It is been bothering her just more recently but she has had some troubles full-time. She just does not know of any specific injury. She also has hypothyroidism and is overdue for a recheck of her labs and would like that done. Lastly she has been urinating frequently.  Objective: Pleasant lady in no acute distress. No CVA tenderness. Abdomen soft. Left knee has a definite effusion. It is mildly tender to the patient. She complains of generalized pain it. No Baker cyst was palpable. She has no erythema. Mild crepitance. Ligaments seem intact.  Assessment: Knee pain and effusion, left Urinary frequency Hypothyroidism for recheck  Plan: TSH, CBC, uric acid, x-ray  Results for orders placed or performed in visit on 10/03/14  POCT UA - Microscopic Only  Result Value Ref Range   WBC, Ur, HPF, POC TNTC    RBC, urine, microscopic NEGATIVE    Bacteria, U Microscopic NEGATIVE    Mucus, UA NEGATIVE    Epithelial cells, urine per micros 0-1    Crystals, Ur, HPF, POC NEGATIVE    Casts, Ur, LPF, POC NEGATIVE    Yeast, UA NEGATIVE   POCT urinalysis dipstick  Result Value Ref Range   Color, UA yellow    Clarity, UA hazy    Glucose, UA neg    Bilirubin, UA neg    Ketones, UA neg    Spec Grav, UA 1.025    Blood, UA neg    pH, UA 6.0    Protein, UA neg    Urobilinogen, UA 0.2    Nitrite, UA neg    Leukocytes, UA Negative   POCT CBC  Result Value Ref Range   WBC 6.8 4.6 - 10.2 K/uL   Lymph, poc 2.5 0.6 - 3.4   POC LYMPH PERCENT 36.1 10 - 50 %L   MID (cbc) 0.3 0 - 0.9   POC MID % 4.0 0 - 12 %M   POC Granulocyte 4.1 2 - 6.9   Granulocyte percent 59.9 37 - 80 %G   RBC 4.15 4.04 - 5.48 M/uL   Hemoglobin 11.9 (A) 12.2 - 16.2 g/dL   HCT, POC 37.9 37.7 - 47.9 %   MCV 91.3 80 - 97 fL   MCH, POC 28.6 27 - 31.2 pg   MCHC 31.3 (A) 31.8 - 35.4 g/dL   RDW, POC 16.6 %   Platelet Count, POC  202 142 - 424 K/uL   MPV 7.6 0 - 99.8 fL  [facility] reading (PRIMARY) by  Dr. Linna Darner Normal knee  Procedure: Using sterile technique and a lateral approach 30 mL was aspirated. The fluid was a clear yellow. It is sent for crystal analysis and cell count. The patient tolerated procedure well. 40 mg of Depo-Medrol were injected with 1.5 mL of 2% lidocaine. Patient ambulated well after the procedure.   Plan: She was instructed in care of the knee. Thyroid functions are pending. Additional labs are pendin She will pick up the medicine for her bladder infection, nitrofurantoin, tonight.g.

## 2014-10-04 LAB — TSH: TSH: 5.481 u[IU]/mL — ABNORMAL HIGH (ref 0.350–4.500)

## 2014-10-04 LAB — URIC ACID: Uric Acid, Serum: 3.1 mg/dL (ref 2.4–7.0)

## 2014-10-05 LAB — SYNOVIAL CELL COUNT + DIFF, W/ CRYSTALS
CRYSTALS FLUID: NONE SEEN
Eosinophils-Synovial: 0 % (ref 0–1)
LYMPHOCYTES-SYNOVIAL FLD: 81 % — AB (ref 0–20)
Monocyte/Macrophage: 14 % — ABNORMAL LOW (ref 50–90)
Neutrophil, Synovial: 5 % (ref 0–25)
WBC, Synovial: 600 cu mm — ABNORMAL HIGH (ref 0–200)

## 2014-10-06 ENCOUNTER — Telehealth: Payer: Self-pay

## 2014-10-06 LAB — URINE CULTURE

## 2014-10-06 NOTE — Telephone Encounter (Signed)
Patient's sister [name] requesting patients "Fosamax" to be called in to [facility]. Per sister she is completely out and really needs this medicine called in today. Call back number for [name] is [phone]

## 2014-10-08 MED ORDER — ALENDRONATE SODIUM 70 MG PO TABS: ORAL_TABLET | ORAL | Status: DC

## 2014-10-08 NOTE — Telephone Encounter (Signed)
Sent in refill

## 2014-10-08 NOTE — Telephone Encounter (Signed)
Pt advised.

## 2014-10-10 ENCOUNTER — Other Ambulatory Visit: Payer: Self-pay

## 2014-10-10 ENCOUNTER — Other Ambulatory Visit: Payer: Self-pay | Admitting: Radiology

## 2014-10-10 MED ORDER — AMOXICILLIN 875 MG PO TABS: 875.0000 mg | ORAL_TABLET | Freq: Two times a day (BID) | ORAL | Status: DC

## 2014-10-10 NOTE — Telephone Encounter (Signed)
Pt req'd that this med be sent to [facility], so resent there.

## 2014-10-18 ENCOUNTER — Telehealth: Payer: Self-pay

## 2014-10-18 NOTE — Telephone Encounter (Signed)
Call to patient.  Declined flu shot.

## 2014-10-30 ENCOUNTER — Telehealth: Payer: Self-pay

## 2014-10-30 MED ORDER — ALENDRONATE SODIUM 70 MG PO TABS: ORAL_TABLET | ORAL | Status: DC

## 2014-10-30 NOTE — Telephone Encounter (Signed)
alendronate (FOSAMAX) 70 MG tablet   [facility]  [id number]

## 2014-10-30 NOTE — Telephone Encounter (Signed)
Medication was refilled on 11/30 and sent to [facility].  Resent medication to [facility] as requested. Pt notified.

## 2014-10-30 NOTE — Telephone Encounter (Signed)
Patricia Campbell called stated [facility] sent over a script for alendronate (FOSAMAX) 70 MG tablet They told her we have not responded to the request    Patricia Rowan would like to pick it up today.  [phone]

## 2014-11-21 ENCOUNTER — Other Ambulatory Visit: Payer: Self-pay | Admitting: Family Medicine

## 2014-11-22 NOTE — Telephone Encounter (Signed)
Zolpidem refill phoned to pt's pharmacy.  She is scheduled for appt w/ me in March 2016.

## 2014-11-28 ENCOUNTER — Ambulatory Visit: Payer: Self-pay | Admitting: Family Medicine

## 2014-12-06 ENCOUNTER — Ambulatory Visit (INDEPENDENT_AMBULATORY_CARE_PROVIDER_SITE_OTHER): Payer: BLUE CROSS/BLUE SHIELD | Admitting: Family Medicine

## 2014-12-06 VITALS — BP 146/60 | HR 82 | Temp 98.0°F | Resp 18 | Ht 59.5 in | Wt 104.6 lb

## 2014-12-06 DIAGNOSIS — M25561 Pain in right knee: Secondary | ICD-10-CM

## 2014-12-06 MED ORDER — MELOXICAM 7.5 MG PO TABS: ORAL_TABLET | ORAL | Status: DC

## 2014-12-06 NOTE — Addendum Note (Signed)
Addended by: Dan Humphreys on: 12/06/2014 11:58 AM   Modules accepted: Level of Service

## 2014-12-06 NOTE — Patient Instructions (Addendum)
Take meloxicam 7.5 mg one pill each day after breakfast for 2 weeks. If it upsets her stomach and all please discontinue.  If you have additional pain you can take acetaminophen (Tylenol) one or 2 pills 3 times daily when necessary  I would recommend you try and taper off of the sleeping medication  If the knee is not improving please return  Can use any of the over-the-counter topical rubs such as BenGay or Heat or Biofreeze for some symptomatic relief.  Insomnia Insomnia is frequent trouble falling and/or staying asleep. Insomnia can be a long term problem or a short term problem. Both are common. Insomnia can be a short term problem when the wakefulness is related to a certain stress or worry. Long term insomnia is often related to ongoing stress during waking hours and/or poor sleeping habits. Overtime, sleep deprivation itself can make the problem worse. Every little thing feels more severe because you are overtired and your ability to cope is decreased. CAUSES   Stress, anxiety, and depression.  Poor sleeping habits.  Distractions such as TV in the bedroom.  Naps close to bedtime.  Engaging in emotionally charged conversations before bed.  Technical reading before sleep.  Alcohol and other sedatives. They may make the problem worse. They can hurt normal sleep patterns and normal dream activity.  Stimulants such as caffeine for several hours prior to bedtime.  Pain syndromes and shortness of breath can cause insomnia.  Exercise late at night.  Changing time zones may cause sleeping problems (jet lag). It is sometimes helpful to have someone observe your sleeping patterns. They should look for periods of not breathing during the night (sleep apnea). They should also look to see how long those periods last. If you live alone or observers are uncertain, you can also be observed at a sleep clinic where your sleep patterns will be professionally monitored. Sleep apnea requires a  checkup and treatment. Give your caregivers your medical history. Give your caregivers observations your family has made about your sleep.  SYMPTOMS   Not feeling rested in the morning.  Anxiety and restlessness at bedtime.  Difficulty falling and staying asleep. TREATMENT   Your caregiver may prescribe treatment for an underlying medical disorders. Your caregiver can give advice or help if you are using alcohol or other drugs for self-medication. Treatment of underlying problems will usually eliminate insomnia problems.  Medications can be prescribed for short time use. They are generally not recommended for lengthy use.  Over-the-counter sleep medicines are not recommended for lengthy use. They can be habit forming.  You can promote easier sleeping by making lifestyle changes such as:  Using relaxation techniques that help with breathing and reduce muscle tension.  Exercising earlier in the day.  Changing your diet and the time of your last meal. No night time snacks.  Establish a regular time to go to bed.  Counseling can help with stressful problems and worry.  Soothing music and white noise may be helpful if there are background noises you cannot remove.  Stop tedious detailed work at least one hour before bedtime. HOME CARE INSTRUCTIONS   Keep a diary. Inform your caregiver about your progress. This includes any medication side effects. See your caregiver regularly. Take note of:  Times when you are asleep.  Times when you are awake during the night.  The quality of your sleep.  How you feel the next day. This information will help your caregiver care for you.  Get out of  bed if you are still awake after 15 minutes. Read or do some quiet activity. Keep the lights down. Wait until you feel sleepy and go back to bed.  Keep regular sleeping and waking hours. Avoid naps.  Exercise regularly.  Avoid distractions at bedtime. Distractions include watching television  or engaging in any intense or detailed activity like attempting to balance the household checkbook.  Develop a bedtime ritual. Keep a familiar routine of bathing, brushing your teeth, climbing into bed at the same time each night, listening to soothing music. Routines increase the success of falling to sleep faster.  Use relaxation techniques. This can be using breathing and muscle tension release routines. It can also include visualizing peaceful scenes. You can also help control troubling or intruding thoughts by keeping your mind occupied with boring or repetitive thoughts like the old concept of counting sheep. You can make it more creative like imagining planting one beautiful flower after another in your backyard garden.  During your day, work to eliminate stress. When this is not possible use some of the previous suggestions to help reduce the anxiety that accompanies stressful situations. MAKE SURE YOU:   Understand these instructions.  Will watch your condition.  Will get help right away if you are not doing well or get worse. Document Released: 10/23/2000 Document Revised: 01/18/2012 Document Reviewed: 11/23/2007 [facility] Patient Information 2015 [place], [place]. This information is not intended to replace advice given to you by your health care provider. Make sure you discuss any questions you have with your health care provider.

## 2014-12-06 NOTE — Progress Notes (Signed)
Subjective: 81 year old lady previously known to me who had DJD of the left knee aspirated and injected 2 months ago. She is now been having more pain in the right knee. It bothers her sometimes at night. She has no known injuries. It has not been swelling. She has not been taking anything for it. She does take her thyroid medicine every day and has been taking Ambien at night. We had a long talk about her insomnia. She's been on the Ambien for years. Her caregiver has talked her into taking just one half a pill. We had a long talk about trying to wean on off of it.  Objective: Pleasant lady alert and oriented. Her knees have minimal effusion in the left knee. No effusion right knee which is the painful one today. No calf tenderness. Good range of motion.  Assessment: Arthritis right knee with right knee pain Insomnia  Plan: As noted above are recommended she try get off of the Ambien. She can take Mobic 7.5 mg daily for 2 weeks to try and calm down her arthritis pain. Use Tylenol if needed for additional pain. Will not keep her on long-term Mobic. Return if worse at any time. Thank you

## 2014-12-12 ENCOUNTER — Ambulatory Visit: Payer: Self-pay | Admitting: Family Medicine

## 2014-12-27 ENCOUNTER — Other Ambulatory Visit: Payer: Self-pay

## 2014-12-27 DIAGNOSIS — M25561 Pain in right knee: Secondary | ICD-10-CM

## 2014-12-27 NOTE — Telephone Encounter (Signed)
[name] calling on behalf of pt, states that she is still in a lot of pain, and would like to know if she can have a refill on meloxicam (MOBIC) 7.5 MG tablet [[id number]]. Pt was seen last on 12/06/2014 11:29 AM for Arthralgia of knee, right.  [name] states that pt is 81 y.o. , and having a hard time. Please advise.

## 2014-12-28 NOTE — Telephone Encounter (Signed)
Can we RF this for pt? Dr Leward Quan, Dr Linna Darner Rxd this for pt on 12/06/14, but will send to you since she is your pt and he is not in office.

## 2014-12-31 ENCOUNTER — Telehealth: Payer: Self-pay | Admitting: *Deleted

## 2014-12-31 DIAGNOSIS — E890 Postprocedural hypothyroidism: Secondary | ICD-10-CM

## 2014-12-31 MED ORDER — MELOXICAM 7.5 MG PO TABS: ORAL_TABLET | ORAL | Status: DC

## 2014-12-31 NOTE — Telephone Encounter (Signed)
Pt sister called again today and was wondering what was going on with her refills.  Pt is requesting an refill on Meloxicam (Mobic) 7.5 mg tab.  Pt states her leg is hurting again.  Also, pt would like to have an Refill on Alendronate (Fosamax) 70 mg that was last refilled by Dr. Leward Quan.

## 2014-12-31 NOTE — Telephone Encounter (Signed)
After these refills will need a follow-up visit

## 2014-12-31 NOTE — Telephone Encounter (Signed)
Give one refill on the Fosamax and have the patient make a follow-up appointment with Dr. Leward Quan.

## 2014-12-31 NOTE — Telephone Encounter (Signed)
Dr Linna Darner, sending to you since this hasn't been addressed yet and you are in today.

## 2015-01-03 MED ORDER — LEVOTHYROXINE SODIUM 88 MCG PO TABS: 88.0000 ug | ORAL_TABLET | Freq: Every day | ORAL | Status: DC

## 2015-01-03 NOTE — Telephone Encounter (Signed)
Pt requesting refill on levothyroxine (SYNTHROID, LEVOTHROID) 88 MCG tablet [[id number]]

## 2015-01-03 NOTE — Telephone Encounter (Signed)
Sent a 30 day supply, she has an appt on 01/16/2015.

## 2015-01-16 ENCOUNTER — Ambulatory Visit (INDEPENDENT_AMBULATORY_CARE_PROVIDER_SITE_OTHER): Payer: Medicare Other | Admitting: Family Medicine

## 2015-01-16 ENCOUNTER — Encounter: Payer: Self-pay | Admitting: Family Medicine

## 2015-01-16 VITALS — BP 178/78 | HR 84 | Temp 97.5°F | Resp 16 | Ht 60.0 in | Wt 102.0 lb

## 2015-01-16 DIAGNOSIS — R350 Frequency of micturition: Secondary | ICD-10-CM | POA: Diagnosis not present

## 2015-01-16 DIAGNOSIS — Z87440 Personal history of urinary (tract) infections: Secondary | ICD-10-CM

## 2015-01-16 DIAGNOSIS — M159 Polyosteoarthritis, unspecified: Secondary | ICD-10-CM

## 2015-01-16 DIAGNOSIS — E890 Postprocedural hypothyroidism: Secondary | ICD-10-CM | POA: Diagnosis not present

## 2015-01-16 LAB — POCT URINALYSIS DIPSTICK
BILIRUBIN UA: NEGATIVE
GLUCOSE UA: NEGATIVE
Ketones, UA: NEGATIVE
Leukocytes, UA: NEGATIVE
NITRITE UA: NEGATIVE
PH UA: 7
Spec Grav, UA: 1.015
UROBILINOGEN UA: 0.2

## 2015-01-16 LAB — BASIC METABOLIC PANEL
BUN: 21 mg/dL (ref 6–23)
CO2: 25 meq/L (ref 19–32)
Calcium: 8.4 mg/dL (ref 8.4–10.5)
Chloride: 100 mEq/L (ref 96–112)
Creat: 0.68 mg/dL (ref 0.50–1.10)
Glucose, Bld: 91 mg/dL (ref 70–99)
Potassium: 4.1 mEq/L (ref 3.5–5.3)
SODIUM: 138 meq/L (ref 135–145)

## 2015-01-16 LAB — POCT UA - MICROSCOPIC ONLY
Casts, Ur, LPF, POC: NEGATIVE
Crystals, Ur, HPF, POC: NEGATIVE
Mucus, UA: NEGATIVE
YEAST UA: NEGATIVE

## 2015-01-16 LAB — THYROID PANEL WITH TSH
FREE THYROXINE INDEX: 3.9 — AB (ref 1.4–3.8)
T3 Uptake: 33 % (ref 22–35)
T4, Total: 11.9 ug/dL (ref 4.5–12.0)
TSH: 2.945 u[IU]/mL (ref 0.350–4.500)

## 2015-01-16 MED ORDER — ZOLPIDEM TARTRATE 10 MG PO TABS: ORAL_TABLET | ORAL | Status: DC

## 2015-01-16 NOTE — Progress Notes (Signed)
S:  This 81 y.o. 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 Pt is in her usual state of health; she lives alone [place] ([place]) and takes care of ADLs. Her sister helps her w/ cleaning chores.  Pt has DJD/OA involving knees, hips, low back and hands. She takes Meloxicam sparingly and Tylenol for most joint pain.  L knee aspirated in the past; pt c/o R knee discomfort w/o swelling, redness or warmth. Pain radiates up into R hip area. She does not have gait instability and has not had any falls.  Pt c/o urinary frequency; she drinks some coffee but seldom drinks other caffenated beverages. She had  UTI in November (Proteus mirabilis). Pt denies fever/chills, anorexia,  incontinence or hematuria, pelvic or flank pain, confusion or disorientation.   Patient Active Problem List   Diagnosis Date Noted  . Idiopathic scoliosis 03/03/2013  . Schatzki's ring 12/12/2012  . Hypothyroidism 09/16/2012  . Osteoporosis 09/16/2012    Prior to Admission medications   Medication Sig Start Date End Date Taking? Authorizing Provider  alendronate (FOSAMAX) 70 MG tablet TAKE 1 TABLET (70 MG TOTAL) BY MOUTH EVERY SEVEN   DAYS. TAKE WITH A FULL GLASS OF WATER ON AT NOON TIME   EMPTY STOMACH. 10/30/14  Yes Mancel Bale, PA-C  Cyanocobalamin (VITAMIN B 12 PO) Take by mouth daily.   Yes Historical Provider, MD  levothyroxine (SYNTHROID, LEVOTHROID) 88 MCG tablet Take 1 tablet (88 mcg total) by mouth daily. 01/03/15  Yes Barton Fanny, MD  meloxicam (MOBIC) 7.5 MG tablet Take one daily after breakfast for pain and inflammation of knee. 12/31/14  Yes Posey Boyer, MD  Multiple Vitamin (MULTIVITAMIN) tablet Take 1 tablet by mouth daily.   Yes Historical Provider, MD  OVER THE COUNTER MEDICATION Lutein 25 mg plus Zenxanthin 5 mg taking daily   Yes Historical  Provider, MD  OVER THE COUNTER MEDICATION Focus Factor taking one daily   Yes Historical Provider, MD  OVER THE COUNTER MEDICATION Vitamin E 400 iu daily   Yes Historical Provider, MD  OVER THE COUNTER MEDICATION Sublingual B 12 ( B 6 + Folic acid  ) taking daily   Yes Historical Provider, MD  OVER THE COUNTER MEDICATION Alfalfa 500 mg taking  2 a day   Yes Historical Provider, MD  OVER THE COUNTER MEDICATION Co Q 10 100 mg taking one daily   Yes Historical Provider, MD  OVER THE COUNTER MEDICATION D 3 2000 iu taking daily   Yes Historical Provider, MD  OVER THE COUNTER MEDICATION Vitamin C 500 mg prn   Yes Historical Provider, MD  [place]    Yes Historical Provider, MD  zolpidem (AMBIEN) 10 MG tablet TAKE 1/2 -1 TABLET BY MOUTH AT BEDTIME AS NEEDED FOR SLEEP   Yes Barton Fanny, MD    Past Surgical History  Procedure Laterality Date  . Total thyroidectomy      Pt reports hx of thyroid cancer.    SOC and FAM HX reviewed.  ROS; As per HPI. Negative for fever/chills, abnormal weight loss, vision disturbances, CP or tightness, palpitations, edema, SOB or DOE, cough, G problems (w/ current medications), rash or other skin changes, HA, dizziness, agitation, tremor, confusion or behavior change. She does have sleep disturbance requiring sleep aid.  Jenetta DownerDanley Danker Vitals:   01/16/15 5102  BP: 178/78  Pulse: 84  Temp: 97.5 F (36.4 C)  Resp: 16    GEN: In NAD; WN,WD.  HENT: [place]/AT; EOMI w/ clear conj/sclerae. Otherwise unremarkable. COR: RRR. LUNGS: Unlabored resp. SKIN: W&D; intact w/o erythema or pallor. MS: R knee- Normal appearance w/o effusion, redness or swelling; good ROM but stiffness noted.  NEURO: A&O x 3; CNs intact. Gait slightly antalgic.   Results for orders placed or performed in visit on 01/16/15  POCT urinalysis dipstick  Result Value Ref Range   Color, UA yellow    Clarity, UA turbid    Glucose, UA neg    Bilirubin, UA neg    Ketones, UA  neg    Spec Grav, UA 1.015    Blood, UA trace    pH, UA 7.0    Protein, UA trace    Urobilinogen, UA 0.2    Nitrite, UA neg    Leukocytes, UA Negative   POCT UA - Microscopic Only  Result Value Ref Range   WBC, Ur, HPF, POC tntc    RBC, urine, microscopic tntc    Bacteria, U Microscopic 1+    Mucus, UA neg    Epithelial cells, urine per micros 0-1    Crystals, Ur, HPF, POC neg    Casts, Ur, LPF, POC neg    Yeast, UA neg      A/P: Postoperative hypothyroidism - Stable and doing well on current medication. Will refill medications once labs are back. Plan: Thyroid Panel With TSH, Basic metabolic panel  Generalized OA- Pt stable and will continue Meloxicam judiciously. She prefers Tylenol and topical analgesics.  Urinary frequency - Plan: POCT urinalysis dipstick, POCT UA - Microscopic Only, Urine culture  History of UTI - Plan: POCT urinalysis dipstick, POCT UA - Microscopic Only, Urine culture   Meds ordered this encounter  Medications  . zolpidem (AMBIEN) 10 MG tablet    Sig: TAKE 1/2 -1 TABLET BY MOUTH AT BEDTIME AS NEEDED FOR SLEEP    Dispense:  30 tablet    Refill:  2

## 2015-01-16 NOTE — Patient Instructions (Addendum)
TUMERIC and GINGER Tea can be helpful for joint pain. TUMERIC can be purchased in capsule form; take 1 capsule daily.  Your urine specimen looks like there may be an infection. I will wait for the culture results before prescribing an antibiotic. I will have some idea of what bacteria you have by Friday.

## 2015-01-17 LAB — URINE CULTURE: Colony Count: 8000

## 2015-01-18 ENCOUNTER — Other Ambulatory Visit: Payer: Self-pay | Admitting: Family Medicine

## 2015-01-18 DIAGNOSIS — E890 Postprocedural hypothyroidism: Secondary | ICD-10-CM

## 2015-01-18 MED ORDER — LEVOTHYROXINE SODIUM 88 MCG PO TABS: 88.0000 ug | ORAL_TABLET | Freq: Every day | ORAL | Status: DC

## 2015-01-18 NOTE — Progress Notes (Signed)
Quick Note:  Please advise pt regarding following labs... Results are normal and urine did not grow enough bacteria to treat you for infection. Continue current medications. I have refilled your thyroid medication for 6 months.  You should schedule your MEDICARE ANNUAL exam for late July or August. ______

## 2015-02-20 ENCOUNTER — Other Ambulatory Visit: Payer: Self-pay | Admitting: Physician Assistant

## 2015-02-21 ENCOUNTER — Other Ambulatory Visit: Payer: Self-pay | Admitting: Family Medicine

## 2015-05-01 ENCOUNTER — Other Ambulatory Visit: Payer: Self-pay | Admitting: Family Medicine

## 2015-05-01 NOTE — Telephone Encounter (Signed)
Refilling for 1 month.  Patient to RTC for future refills and followup.

## 2015-06-20 ENCOUNTER — Other Ambulatory Visit: Payer: Self-pay | Admitting: Family Medicine

## 2015-06-22 MED ORDER — ZOLPIDEM TARTRATE 5 MG PO TABS: 5.0000 mg | ORAL_TABLET | Freq: Every evening | ORAL | Status: DC | PRN

## 2015-06-22 NOTE — Telephone Encounter (Signed)
Pt sister [name] notified and pt has appt with Dr. Tamala Julian in Sept

## 2015-06-22 NOTE — Telephone Encounter (Signed)
Please let the patient know that I have changed her rx to a 5 mg tab so she does not have to break in half - as the medication is not scored she is likely not getting the correct amount of medication when breaking it in half.  Please also let her know that she needs to establish care with another provider as Leward Quan has retired.

## 2015-06-27 ENCOUNTER — Encounter: Payer: Self-pay | Admitting: Urgent Care

## 2015-06-27 ENCOUNTER — Ambulatory Visit (INDEPENDENT_AMBULATORY_CARE_PROVIDER_SITE_OTHER): Payer: Medicare Other | Admitting: Urgent Care

## 2015-06-27 VITALS — BP 168/68 | HR 85 | Temp 97.7°F | Resp 16 | Ht 60.0 in | Wt 105.0 lb

## 2015-06-27 DIAGNOSIS — N3090 Cystitis, unspecified without hematuria: Secondary | ICD-10-CM | POA: Diagnosis not present

## 2015-06-27 DIAGNOSIS — M545 Low back pain, unspecified: Secondary | ICD-10-CM | POA: Insufficient documentation

## 2015-06-27 DIAGNOSIS — R109 Unspecified abdominal pain: Secondary | ICD-10-CM | POA: Diagnosis not present

## 2015-06-27 DIAGNOSIS — R350 Frequency of micturition: Secondary | ICD-10-CM | POA: Diagnosis not present

## 2015-06-27 LAB — POCT URINALYSIS DIPSTICK
Bilirubin, UA: NEGATIVE
GLUCOSE UA: NEGATIVE
Ketones, UA: NEGATIVE
NITRITE UA: POSITIVE
PH UA: 7
Protein, UA: NEGATIVE
Spec Grav, UA: 1.01
UROBILINOGEN UA: 0.2

## 2015-06-27 LAB — POCT CBC
GRANULOCYTE PERCENT: 62.6 % (ref 37–80)
HCT, POC: 38.5 % (ref 37.7–47.9)
HEMOGLOBIN: 12.1 g/dL — AB (ref 12.2–16.2)
Lymph, poc: 2.2 (ref 0.6–3.4)
MCH: 27.2 pg (ref 27–31.2)
MCHC: 31.4 g/dL — AB (ref 31.8–35.4)
MCV: 86.5 fL (ref 80–97)
MID (cbc): 0.5 (ref 0–0.9)
MPV: 8.2 fL (ref 0–99.8)
PLATELET COUNT, POC: 175 10*3/uL (ref 142–424)
POC Granulocyte: 4.5 (ref 2–6.9)
POC LYMPH PERCENT: 30.8 %L (ref 10–50)
POC MID %: 6.6 %M (ref 0–12)
RBC: 4.45 M/uL (ref 4.04–5.48)
RDW, POC: 15.1 %
WBC: 7.2 10*3/uL (ref 4.6–10.2)

## 2015-06-27 LAB — POCT UA - MICROSCOPIC ONLY
CASTS, UR, LPF, POC: NEGATIVE
Crystals, Ur, HPF, POC: NEGATIVE
Epithelial cells, urine per micros: NEGATIVE
Mucus, UA: NEGATIVE
Yeast, UA: NEGATIVE

## 2015-06-27 MED ORDER — NITROFURANTOIN MONOHYD MACRO 100 MG PO CAPS
100.0000 mg | ORAL_CAPSULE | Freq: Two times a day (BID) | ORAL | Status: DC
Start: 2015-06-27 — End: 2017-09-27

## 2015-06-27 NOTE — Patient Instructions (Addendum)
For low back pain, use Tylenol 300-600mg  every 6-8 hours. Do not use ibuprofen or naprosyn.   Urinary Tract Infection Urinary tract infections (UTIs) can develop anywhere along your urinary tract. Your urinary tract is your body's drainage system for removing wastes and extra water. Your urinary tract includes two kidneys, two ureters, a bladder, and a urethra. Your kidneys are a pair of bean-shaped organs. Each kidney is about the size of your fist. They are located below your ribs, one on each side of your spine. CAUSES Infections are caused by microbes, which are microscopic organisms, including fungi, viruses, and bacteria. These organisms are so small that they can only be seen through a microscope. Bacteria are the microbes that most commonly cause UTIs. SYMPTOMS  Symptoms of UTIs may vary by age and gender of the patient and by the location of the infection. Symptoms in young women typically include a frequent and intense urge to urinate and a painful, burning feeling in the bladder or urethra during urination. Older women and men are more likely to be tired, shaky, and weak and have muscle aches and abdominal pain. A fever may mean the infection is in your kidneys. Other symptoms of a kidney infection include pain in your back or sides below the ribs, nausea, and vomiting. DIAGNOSIS To diagnose a UTI, your caregiver will ask you about your symptoms. Your caregiver also will ask to provide a urine sample. The urine sample will be tested for bacteria and white blood cells. White blood cells are made by your body to help fight infection. TREATMENT  Typically, UTIs can be treated with medication. Because most UTIs are caused by a bacterial infection, they usually can be treated with the use of antibiotics. The choice of antibiotic and length of treatment depend on your symptoms and the type of bacteria causing your infection. HOME CARE INSTRUCTIONS  If you were prescribed antibiotics, take them  exactly as your caregiver instructs you. Finish the medication even if you feel better after you have only taken some of the medication.  Drink enough water and fluids to keep your urine clear or pale yellow.  Avoid caffeine, tea, and carbonated beverages. They tend to irritate your bladder.  Empty your bladder often. Avoid holding urine for long periods of time.  Empty your bladder before and after sexual intercourse.  After a bowel movement, women should cleanse from front to back. Use each tissue only once. SEEK MEDICAL CARE IF:   You have back pain.  You develop a fever.  Your symptoms do not begin to resolve within 3 days. SEEK IMMEDIATE MEDICAL CARE IF:   You have severe back pain or lower abdominal pain.  You develop chills.  You have nausea or vomiting.  You have continued burning or discomfort with urination. MAKE SURE YOU:   Understand these instructions.  Will watch your condition.  Will get help right away if you are not doing well or get worse. Document Released: 08/05/2005 Document Revised: 04/26/2012 Document Reviewed: 12/04/2011 [facility] Patient Information 2015 [place], [place]. This information is not intended to replace advice given to you by your health care provider. Make sure you discuss any questions you have with your health care provider.

## 2015-06-27 NOTE — Progress Notes (Signed)
MRN: [id number] DOB: [birth year]  Subjective:   Patricia Campbell is a 81 y.o. female presenting for chief complaint of Back Pain and Urinary Frequency  Urinary frequency - reports several month history of urinary frequency, flank pain and low back pain. Has not tried any medications for relief. Denies fever, dysuria, hematuria, abdominal pain, genital rash, genital irritation and vaginal discharge, malaise, nausea and vomiting, weight loss. Patient admits not drinking enough water, has about 1 glass per day. Has a history of thyroid cancer, mother had a history of colon cancer.   Back pain - reports a history of intermittent back pain. Patient has history of significant osteoporosis, degenerative disc disease of left femur and lumbar spine. This is currently managed with Fosamax. Has been managed by Dr. Lynnette Caffey in the past. She has tried Advil and other NSAIDs intermittently with minimal relief. Denies any other aggravating or relieving factors, no other questions or concerns.  Patricia Campbell has a current medication list which includes the following prescription(s): alendronate, cyanocobalamin, levothyroxine, meloxicam, multivitamin, OVER THE COUNTER MEDICATION, OVER THE COUNTER MEDICATION, OVER THE COUNTER MEDICATION, OVER THE COUNTER MEDICATION, OVER THE COUNTER MEDICATION, OVER THE COUNTER MEDICATION, OVER THE COUNTER MEDICATION, OVER THE COUNTER MEDICATION, OVER THE COUNTER MEDICATION, zolpidem, and zolpidem. She is allergic to codeine.  Patricia Campbell  has a past medical history of Cancer and Thyroid disease. Also  has past surgical history that includes Total thyroidectomy.  ROS As in subjective.  Objective:   Vitals: BP 168/68 mmHg  Pulse 85  Temp(Src) 97.7 F (36.5 C) (Oral)  Resp 16  Ht 5' (1.524 m)  Wt 105 lb (47.628 kg)  BMI 20.51 kg/m2  Wt Readings from Last 3 Encounters:  06/27/15 105 lb (47.628 kg)  01/16/15 102 lb (46.267 kg)  12/06/14 104 lb 9.6 oz (47.446 kg)   Physical Exam    Constitutional: She is oriented to person, place, and time. She appears well-developed and well-nourished.  Cardiovascular: Normal rate, regular rhythm and intact distal pulses.   Pulmonary/Chest: No respiratory distress. She has no wheezes. She has no rales.  Abdominal: Soft. Bowel sounds are normal. She exhibits no distension and no mass. There is tenderness (generalized throughout).  Musculoskeletal: She exhibits no edema.  Neurological: She is alert and oriented to person, place, and time.  Skin: Skin is warm and dry. No rash noted. No erythema. No pallor.   Results for orders placed or performed in visit on 06/27/15 (from the past 24 hour(s))  POCT urinalysis dipstick     Status: Abnormal   Collection Time: 06/27/15  1:47 PM  Result Value Ref Range   Color, UA Light Yellow    Clarity, UA Turbid    Glucose, UA Negative    Bilirubin, UA Negative    Ketones, UA Negative    Spec Grav, UA 1.010    Blood, UA Small    pH, UA 7.0    Protein, UA Negative    Urobilinogen, UA 0.2    Nitrite, UA Positive    Leukocytes, UA large (3+) (A) Negative  POCT UA - Microscopic Only     Status: None   Collection Time: 06/27/15  1:48 PM  Result Value Ref Range   WBC, Ur, HPF, POC TNTC    RBC, urine, microscopic 0-3    Bacteria, U Microscopic 4+    Mucus, UA negative    Epithelial cells, urine per micros negative    Crystals, Ur, HPF, POC Negative    Casts, Ur, LPF,  POC Negative    Yeast, UA Negative    Assessment and Plan :   1. Cystitis 2. Urinary frequency 3. Left flank pain - Urine culture pending, will start patient on Macrobid, advised adequate hydration. Consider possibility of renal stone, infected renal stone.   4. Low back pain, unspecified back pain laterality, with sciatica presence unspecified - Likely due to her osteoporosis and degenerative disc disease. I recommended she use Tylenol as needed for her pain since she has not been using this. I am not inclined to have her  undergo aggressive interventions given her age and potential for complications.   Follow up in 4 weeks.  Jaynee Eagles, PA-C [facility] [phone] 06/27/2015 1:18 PM

## 2015-06-28 LAB — COMPREHENSIVE METABOLIC PANEL
ALT: 15 U/L (ref 6–29)
AST: 24 U/L (ref 10–35)
Albumin: 3.9 g/dL (ref 3.6–5.1)
Alkaline Phosphatase: 60 U/L (ref 33–130)
BILIRUBIN TOTAL: 0.4 mg/dL (ref 0.2–1.2)
BUN: 18 mg/dL (ref 7–25)
CO2: 29 mmol/L (ref 20–31)
CREATININE: 0.77 mg/dL (ref 0.60–0.88)
Calcium: 9.4 mg/dL (ref 8.6–10.4)
Chloride: 98 mmol/L (ref 98–110)
GLUCOSE: 88 mg/dL (ref 65–99)
Potassium: 4.4 mmol/L (ref 3.5–5.3)
SODIUM: 140 mmol/L (ref 135–146)
Total Protein: 6.2 g/dL (ref 6.1–8.1)

## 2015-06-29 LAB — URINE CULTURE: Colony Count: >=100000

## 2015-06-30 ENCOUNTER — Encounter: Payer: Self-pay | Admitting: Family Medicine

## 2015-07-17 ENCOUNTER — Other Ambulatory Visit: Payer: Self-pay | Admitting: Physician Assistant

## 2015-07-18 ENCOUNTER — Ambulatory Visit: Payer: Self-pay | Admitting: Urgent Care

## 2015-07-18 NOTE — Telephone Encounter (Signed)
I have this patient once definitely not regularly. It was for urinary freq and back pain. She was Dr. Janelle Floor patient. She needs to establish care with a provider for CS refill. You can have this sent to the PA Pool since Dr. Leward Quan is no longer working here.

## 2015-07-18 NOTE — Telephone Encounter (Signed)
Patricia Belts, do you want to give pt RF since you have seen pt regularly and at least went over her meds?

## 2015-07-22 ENCOUNTER — Encounter: Payer: Self-pay | Admitting: Family Medicine

## 2015-07-22 ENCOUNTER — Ambulatory Visit (INDEPENDENT_AMBULATORY_CARE_PROVIDER_SITE_OTHER): Payer: Medicare Other | Admitting: Family Medicine

## 2015-07-22 VITALS — BP 139/50 | HR 96 | Temp 98.1°F | Resp 16 | Wt 105.4 lb

## 2015-07-22 DIAGNOSIS — M810 Age-related osteoporosis without current pathological fracture: Secondary | ICD-10-CM

## 2015-07-22 DIAGNOSIS — M4120 Other idiopathic scoliosis, site unspecified: Secondary | ICD-10-CM

## 2015-07-22 DIAGNOSIS — E890 Postprocedural hypothyroidism: Secondary | ICD-10-CM | POA: Diagnosis not present

## 2015-07-22 DIAGNOSIS — Z85850 Personal history of malignant neoplasm of thyroid: Secondary | ICD-10-CM

## 2015-07-22 DIAGNOSIS — R351 Nocturia: Secondary | ICD-10-CM | POA: Diagnosis not present

## 2015-07-22 DIAGNOSIS — M5136 Other intervertebral disc degeneration, lumbar region: Secondary | ICD-10-CM | POA: Diagnosis not present

## 2015-07-22 DIAGNOSIS — G4700 Insomnia, unspecified: Secondary | ICD-10-CM

## 2015-07-22 DIAGNOSIS — N390 Urinary tract infection, site not specified: Secondary | ICD-10-CM | POA: Diagnosis not present

## 2015-07-22 LAB — POCT UA - MICROSCOPIC ONLY
CASTS, UR, LPF, POC: NEGATIVE
CRYSTALS, UR, HPF, POC: NEGATIVE
Epithelial cells, urine per micros: NEGATIVE
Mucus, UA: NEGATIVE
RBC, urine, microscopic: NEGATIVE
YEAST UA: NEGATIVE

## 2015-07-22 LAB — TSH: TSH: 4.514 u[IU]/mL — AB (ref 0.350–4.500)

## 2015-07-22 LAB — POCT URINALYSIS DIPSTICK
BILIRUBIN UA: NEGATIVE
Glucose, UA: NEGATIVE
Nitrite, UA: NEGATIVE
PH UA: 5.5
Protein, UA: NEGATIVE
SPEC GRAV UA: 1.015
Urobilinogen, UA: 0.2

## 2015-07-22 LAB — T4, FREE: FREE T4: 1.48 ng/dL (ref 0.80–1.80)

## 2015-07-22 NOTE — Progress Notes (Signed)
Subjective:    Patient ID: Patricia Campbell, female    DOB: 1927/04/13, 81 y.o.   MRN: [id number]  07/22/2015  establish care with Dr. Tamala Julian and bladder problems   HPI This 81 y.o. female presents for evaluation of nocturia and to establish care with a new provider; previous patient of Dr. Leward Quan.  Nocturia:   Also suffering with lower abdominal pain and lower back pain.  No fever; +chills/sweats.  No dysuria;  Chronic lower back pain; +recent lower abdominal pain.  No n/v.  +some diarrhea.  No constipation.  Tries to eat regularly.  Nocturia x 3; duration unsure. Poor historian.  Nocturia will worsen intermittently and frequently will improve with abx.  Sweets seems to make worse.  Intermittent urinary incontinence.  No depends or poise pads.    Recently evaluated on 06-28-15 for urinary symptoms.  Urine culture + for E.Coli UTI; treated with Macrobid by [facility].  Chronic lower back pain:  S/p Lumbar spine films in 09/21/2013 +severe scoliosis with multilevel DDD at T12-L1 and L3-4.  L1-2 and L2-3.  Osteoporosis: started Fosamax several years ago at request of patient.  Last DEXA 2010, 2013 by report.    Insomnia: taking Ambien 5mg  qhs.  Still waking up 3 times per night despite Ambien use; sister recommends that pt not take Ambien if it is not effective.  Dr. Linna Darner advised against Ambien use for patient at last visit due to advanced age.    Review of Systems  Constitutional: Negative for fever, chills, diaphoresis and fatigue.  Eyes: Negative for visual disturbance.  Respiratory: Negative for cough and shortness of breath.   Cardiovascular: Negative for chest pain, palpitations and leg swelling.  Gastrointestinal: Positive for abdominal pain and diarrhea. Negative for nausea, vomiting, constipation, blood in stool, abdominal distention and rectal pain.  Endocrine: Negative for cold intolerance, heat intolerance, polydipsia, polyphagia and polyuria.  Genitourinary: Negative for  dysuria, urgency, frequency and hematuria.  Musculoskeletal: Positive for back pain and arthralgias.  Neurological: Negative for dizziness, tremors, seizures, syncope, facial asymmetry, speech difficulty, weakness, light-headedness, numbness and headaches.  Psychiatric/Behavioral: Positive for sleep disturbance. Negative for dysphoric mood. The patient is not nervous/anxious.     Past Medical History  Diagnosis Date  . Cancer     Thyroid cancer >> Thyroidectomy.  . Thyroid disease   . Osteoporosis   . Scoliosis   . Degenerative disc disease, lumbar   . Degenerative disc disease, thoracic    Past Surgical History  Procedure Laterality Date  . Total thyroidectomy      Pt reports hx of thyroid cancer.  . Tonsillectomy     Allergies  Allergen Reactions  . Codeine    Current Outpatient Prescriptions  Medication Sig Dispense Refill  . alendronate (FOSAMAX) 70 MG tablet TAKE 1 TABLET (70 MG TOTAL) BY MOUTH EVERY SEVEN   DAYS. TAKE WITH A FULL GLASS OF WATER ON AT NOON TIME   EMPTY STOMACH. 4 tablet 4  . Cyanocobalamin (VITAMIN B 12 PO) Take by mouth daily.    Marland Kitchen levothyroxine (SYNTHROID, LEVOTHROID) 88 MCG tablet Take 1 tablet (88 mcg total) by mouth daily. 30 tablet 5  . meloxicam (MOBIC) 7.5 MG tablet Take one daily after breakfast for pain and inflammation of knee. 30 tablet 1  . Multiple Vitamin (MULTIVITAMIN) tablet Take 1 tablet by mouth daily.    . nitrofurantoin, macrocrystal-monohydrate, (MACROBID) 100 MG capsule Take 1 capsule (100 mg total) by mouth 2 (two) times daily. 20 capsule 0  .  OVER THE COUNTER MEDICATION Lutein 25 mg plus Zenxanthin 5 mg taking daily    . OVER THE COUNTER MEDICATION Focus Factor taking one daily    . OVER THE COUNTER MEDICATION Vitamin E 400 iu daily    . OVER THE COUNTER MEDICATION Sublingual B 12 ( B 6 + Folic acid  ) taking daily    . OVER THE COUNTER MEDICATION Alfalfa 500 mg taking  2 a day    . OVER THE COUNTER MEDICATION Co Q 10 100 mg taking  one daily    . OVER THE COUNTER MEDICATION D 3 2000 iu taking daily    . OVER THE COUNTER MEDICATION Vitamin C 500 mg prn    . OVER THE COUNTER MEDICATION     . zolpidem (AMBIEN) 5 MG tablet Take 1 tablet (5 mg total) by mouth at bedtime as needed for sleep. 30 tablet 0   No current facility-administered medications for this visit.   Social History   Social History  . Marital Status: Married    Spouse Name: N/A  . Number of Children: N/A  . Years of Education: N/A   Occupational History  . Not on file.   Social History Main Topics  . Smoking status: Never Smoker   . Smokeless tobacco: Not on file  . Alcohol Use: No  . Drug Use: No  . Sexual Activity: No   Other Topics Concern  . Not on file   Social History Narrative   Marital status:  Widowed x 2; second husband died in 73.       Children: 2 sons (1 deceased; 1 living); several grandchildren.      Lives: alone in a trailer; sister lives close.      Tobacco; never      Alcohol: none; "I come from a [place] family"       Exercise: jumper and cardio exercise machine; sporadic use       ADLs: ambulates with cane but uses rarely; performs most ADLs; minimal cooking; NO DRIVING.   History reviewed. No pertinent family history.     Objective:    BP 139/50 mmHg  Pulse 96  Temp(Src) 98.1 F (36.7 C) (Oral)  Resp 16  Wt 105 lb 6.4 oz (47.809 kg) Physical Exam  Constitutional: She is oriented to person, place, and time. She appears well-developed and well-nourished. No distress.  HENT:  Head: Normocephalic and atraumatic.  Right Ear: External ear normal.  Left Ear: External ear normal.  Nose: Nose normal.  Mouth/Throat: Oropharynx is clear and moist.  Eyes: Conjunctivae and EOM are normal. Pupils are equal, round, and reactive to light.  Neck: Normal range of motion. Neck supple. Carotid bruit is not present. No thyromegaly present.  Cardiovascular: Normal rate, regular rhythm, normal heart sounds and intact  distal pulses.  Exam reveals no gallop and no friction rub.   No murmur heard. Pulmonary/Chest: Effort normal and breath sounds normal. She has no wheezes. She has no rales.  Abdominal: Soft. Bowel sounds are normal. She exhibits no distension and no mass. There is no tenderness. There is no rebound and no guarding.  Lymphadenopathy:    She has no cervical adenopathy.  Neurological: She is alert and oriented to person, place, and time. No cranial nerve deficit.  Skin: Skin is warm and dry. No rash noted. She is not diaphoretic. No erythema. No pallor.  Psychiatric: She has a normal mood and affect. Her behavior is normal.  Assessment & Plan:   1. Nocturia   2. Postoperative hypothyroidism   3. History of thyroid cancer   4. Osteoporosis   5. Idiopathic scoliosis   6. Degenerative disc disease, lumbar     1. Nocturia; chronic with recent worsening; associated with lower abdominal pain and lower back pain; send urine culture and treat if indicated; recent treatment with Macrobid for E. Coli UTI. Intermittent urinary incontinence. 2.  Hypothyroidism post-operative due to thyroid cancer: stable; obtain labs; continue medication. 3.  Osteoporosis: stable; continue Fosamax, calcium plus D. 4.  DDD lumbar spine with scoliosis: chronic; continue Meloxicam PRN. 5. Insomnia; uncontrolled with Ambien 5mg  qhs; recommend discontinuation of Ambien due to age and female gender and osteoporosis. Very high risk for falls at age 33.    Orders Placed This Encounter  Procedures  . Urine culture  . TSH  . T4, free  . POCT urinalysis dipstick  . POCT UA - Microscopic Only   No orders of the defined types were placed in this encounter.    Return in about 6 months (around 01/19/2016) for complete physical examiniation.    Valia Wingard Elayne Guerin, M.D. [facility] [address] [place], [place]  [address] [phone] phone [phone] fax

## 2015-07-23 DIAGNOSIS — M5136 Other intervertebral disc degeneration, lumbar region: Secondary | ICD-10-CM | POA: Insufficient documentation

## 2015-07-23 DIAGNOSIS — G4700 Insomnia, unspecified: Secondary | ICD-10-CM | POA: Insufficient documentation

## 2015-07-23 DIAGNOSIS — Z85850 Personal history of malignant neoplasm of thyroid: Secondary | ICD-10-CM | POA: Insufficient documentation

## 2015-07-25 LAB — URINE CULTURE: Colony Count: >=100000

## 2015-07-26 ENCOUNTER — Telehealth: Payer: Self-pay | Admitting: Family Medicine

## 2015-07-26 ENCOUNTER — Other Ambulatory Visit: Payer: Self-pay | Admitting: Family Medicine

## 2015-07-26 DIAGNOSIS — M25561 Pain in right knee: Secondary | ICD-10-CM

## 2015-07-26 NOTE — Telephone Encounter (Signed)
Yes, see notes under 07/26/15 refill encounter.

## 2015-07-26 NOTE — Telephone Encounter (Signed)
Refills on all medications. Asked patient's caregiver three times what pharmacy to send it to and she did not answer. Patient needs these meds today.  [phone]

## 2015-07-26 NOTE — Telephone Encounter (Signed)
I think you were working on this

## 2015-07-26 NOTE — Telephone Encounter (Signed)
Dr Tamala Julian, I declined Lorrin Mais per your OV notes. Pended 6 mos RFs of the others, but wanted to check to make sure you don't want to change the levothyroxine dose (lab results JUST above normal high).

## 2015-07-27 MED ORDER — LEVOTHYROXINE SODIUM 100 MCG PO TABS: 100.0000 ug | ORAL_TABLET | Freq: Every day | ORAL | Status: DC

## 2015-07-27 MED ORDER — CEPHALEXIN 500 MG PO CAPS: 500.0000 mg | ORAL_CAPSULE | Freq: Three times a day (TID) | ORAL | Status: DC

## 2015-07-27 MED ORDER — MELOXICAM 7.5 MG PO TABS: ORAL_TABLET | ORAL | Status: DC

## 2015-07-27 NOTE — Addendum Note (Signed)
Addended by: Wardell Honour on: 07/27/2015 01:39 PM   Modules accepted: Orders

## 2015-11-25 ENCOUNTER — Telehealth: Payer: Self-pay

## 2015-11-25 NOTE — Telephone Encounter (Signed)
Patient's sister is calling to see if blood work is needed for the patient's appointment on 3/28

## 2015-11-25 NOTE — Telephone Encounter (Signed)
Please advise.  Last labs in Sept/2016

## 2015-11-25 NOTE — Telephone Encounter (Signed)
I would assume so but am not sure exactly what labs Dr. Tamala Julian will want. Is she want wanting to know so she can come in prior to her appt for lab draw? Or earlier in the day of her appt for lab draw? If she wants to come in for lab draw prior to appt, please direct to Dr. Tamala Julian so she can order future labs. If wants to come in earlier in the day, she can come in at 8 am.

## 2015-11-26 NOTE — Telephone Encounter (Signed)
I feel we have time to ask Dr. Tamala Julian. Sorry for this message going to PA pool. My eyes saw 1/28

## 2015-11-27 NOTE — Telephone Encounter (Signed)
Yes, patient will need labs at visit in 01/2016 yet does not need to be fasting for labs.

## 2015-11-27 NOTE — Telephone Encounter (Signed)
Left detailed VM that pt will need labs, but not need to fast

## 2016-01-19 ENCOUNTER — Other Ambulatory Visit: Payer: Self-pay | Admitting: Family Medicine

## 2016-01-20 ENCOUNTER — Ambulatory Visit: Payer: Medicare Other | Admitting: Family Medicine

## 2016-02-04 ENCOUNTER — Encounter: Payer: Self-pay | Admitting: Family Medicine

## 2016-02-04 ENCOUNTER — Ambulatory Visit (INDEPENDENT_AMBULATORY_CARE_PROVIDER_SITE_OTHER): Payer: Medicare Other | Admitting: Family Medicine

## 2016-02-04 VITALS — BP 153/69 | HR 77 | Temp 97.8°F | Resp 16 | Ht 59.5 in | Wt 111.0 lb

## 2016-02-04 DIAGNOSIS — M5136 Other intervertebral disc degeneration, lumbar region: Secondary | ICD-10-CM

## 2016-02-04 DIAGNOSIS — E890 Postprocedural hypothyroidism: Secondary | ICD-10-CM | POA: Diagnosis not present

## 2016-02-04 DIAGNOSIS — M810 Age-related osteoporosis without current pathological fracture: Secondary | ICD-10-CM

## 2016-02-04 DIAGNOSIS — K591 Functional diarrhea: Secondary | ICD-10-CM

## 2016-02-04 DIAGNOSIS — Z0000 Encounter for general adult medical examination without abnormal findings: Secondary | ICD-10-CM

## 2016-02-04 DIAGNOSIS — M4120 Other idiopathic scoliosis, site unspecified: Secondary | ICD-10-CM | POA: Diagnosis not present

## 2016-02-04 DIAGNOSIS — Z85850 Personal history of malignant neoplasm of thyroid: Secondary | ICD-10-CM

## 2016-02-04 DIAGNOSIS — G4700 Insomnia, unspecified: Secondary | ICD-10-CM

## 2016-02-04 LAB — CBC WITH DIFFERENTIAL/PLATELET
BASOS PCT: 1 % (ref 0–1)
Basophils Absolute: 0.1 10*3/uL (ref 0.0–0.1)
Eosinophils Absolute: 0.1 10*3/uL (ref 0.0–0.7)
Eosinophils Relative: 2 % (ref 0–5)
HCT: 35.8 % — ABNORMAL LOW (ref 36.0–46.0)
HEMOGLOBIN: 11.7 g/dL — AB (ref 12.0–15.0)
Lymphocytes Relative: 35 % (ref 12–46)
Lymphs Abs: 1.9 10*3/uL (ref 0.7–4.0)
MCH: 28.3 pg (ref 26.0–34.0)
MCHC: 32.7 g/dL (ref 30.0–36.0)
MCV: 86.7 fL (ref 78.0–100.0)
MONOS PCT: 8 % (ref 3–12)
MPV: 11.2 fL (ref 8.6–12.4)
Monocytes Absolute: 0.4 10*3/uL (ref 0.1–1.0)
NEUTROS ABS: 2.9 10*3/uL (ref 1.7–7.7)
NEUTROS PCT: 54 % (ref 43–77)
Platelets: 187 10*3/uL (ref 150–400)
RBC: 4.13 MIL/uL (ref 3.87–5.11)
RDW: 14.8 % (ref 11.5–15.5)
WBC: 5.4 10*3/uL (ref 4.0–10.5)

## 2016-02-04 MED ORDER — DICLOFENAC SODIUM 1.5 % TD SOLN: 30.0000 [drp] | Freq: Four times a day (QID) | TRANSDERMAL | Status: DC

## 2016-02-04 NOTE — Progress Notes (Signed)
Subjective:    Patient ID: Patricia Campbell, female    DOB: 07-14-1927, 82 y.o.   MRN: [id number]  02/04/2016  Follow-up   HPI This 82 y.o. female presents for six month follow-up:  1.  Diarrhea: occurs once per week on average.  Will have diarrhea 3 times per day with loose stools.  Non-bloody stools.  Will take diarrhea tablet/Imodium AD with improvement.    2. Hypothyroidism/thyroid cancer: Patient reports good compliance with medication, good tolerance to medication, and good symptom control.  Due for labs.  Increased Synthroid to 162mcg daily after last visit.   3.  Osteoporosis:  Patient reports good compliance with medication, good tolerance to medication, and good symptom control.  Fosamax weekly; takes Fosamax on Fridays.  Diarrhea rarely during weekend after taking Fosamax.    No recent falls; no cane or assistant devices.    4. Recurrent UTIs:  No recent issues.  Slight memory issues over the past six months; forgetting a lot of things.  Type of pills taking.   Review of Systems  Constitutional: Negative for fever, chills, diaphoresis and fatigue.  Eyes: Negative for visual disturbance.  Respiratory: Negative for cough and shortness of breath.   Cardiovascular: Negative for chest pain, palpitations and leg swelling.  Gastrointestinal: Negative for nausea, vomiting, abdominal pain, diarrhea and constipation.  Endocrine: Negative for cold intolerance, heat intolerance, polydipsia, polyphagia and polyuria.  Neurological: Negative for dizziness, tremors, seizures, syncope, facial asymmetry, speech difficulty, weakness, light-headedness, numbness and headaches.    Past Medical History  Diagnosis Date  . Cancer [facility])     Thyroid cancer >> Thyroidectomy.  . Thyroid disease   . Osteoporosis   . Scoliosis   . Degenerative disc disease, lumbar   . Degenerative disc disease, thoracic    Past Surgical History  Procedure Laterality Date  . Total thyroidectomy      Pt  reports hx of thyroid cancer.  . Tonsillectomy     Allergies  Allergen Reactions  . Codeine    Current Outpatient Prescriptions  Medication Sig Dispense Refill  . alendronate (FOSAMAX) 70 MG tablet TAKE 1 TABLET (70 MG TOTAL) BY MOUTH EVERY SEVEN   DAYS. TAKE WITH A FULL GLASS OF WATER ON AT NOON TIME   EMPTY STOMACH. 4 tablet 11  . Cyanocobalamin (VITAMIN B 12 PO) Take by mouth daily.    Marland Kitchen levothyroxine (SYNTHROID, LEVOTHROID) 100 MCG tablet take 1 tablet by mouth once daily 30 tablet 0  . Multiple Vitamin (MULTIVITAMIN) tablet Take 1 tablet by mouth daily.    Marland Kitchen OVER THE COUNTER MEDICATION Focus Factor taking one daily    . OVER THE COUNTER MEDICATION Vitamin E 400 iu daily    . OVER THE COUNTER MEDICATION Alfalfa 500 mg taking  2 a day    . OVER THE COUNTER MEDICATION Co Q 10 100 mg taking one daily    . OVER THE COUNTER MEDICATION D 3 2000 iu taking daily    . OVER THE COUNTER MEDICATION Vitamin C 500 mg prn    . OVER THE COUNTER MEDICATION     . cephALEXin (KEFLEX) 500 MG capsule Take 1 capsule (500 mg total) by mouth 3 (three) times daily. (Patient not taking: Reported on 02/04/2016) 21 capsule 0  . Diclofenac Sodium 1.5 % SOLN Place 1.5 mLs onto the skin 4 (four) times daily. 150 mL 1  . meloxicam (MOBIC) 7.5 MG tablet Take one daily after breakfast for pain and inflammation of knee. (Patient  not taking: Reported on 02/04/2016) 30 tablet 5  . nitrofurantoin, macrocrystal-monohydrate, (MACROBID) 100 MG capsule Take 1 capsule (100 mg total) by mouth 2 (two) times daily. (Patient not taking: Reported on 02/04/2016) 20 capsule 0  . OVER THE COUNTER MEDICATION Reported on 02/04/2016    . OVER THE COUNTER MEDICATION Reported on 02/04/2016    . OVER THE COUNTER MEDICATION Reported on 02/04/2016    . zolpidem (AMBIEN) 5 MG tablet Take 1 tablet (5 mg total) by mouth at bedtime as needed for sleep. (Patient not taking: Reported on 02/04/2016) 30 tablet 0   No current facility-administered  medications for this visit.   Social History   Social History  . Marital Status: Married    Spouse Name: N/A  . Number of Children: N/A  . Years of Education: N/A   Occupational History  . retired    Social History Main Topics  . Smoking status: Never Smoker   . Smokeless tobacco: Not on file  . Alcohol Use: No  . Drug Use: No  . Sexual Activity: No   Other Topics Concern  . Not on file   Social History Narrative   Marital status:  Widowed x 2; second husband died in 53.       Children: 2 daughters (1 deceased; 1 living); several grandchildren.  Daughter in [place].  Never sees daughter.      Lives: alone in a trailer; sister lives close.      Tobacco; never      Alcohol: none; "I come from a [place] family"       Exercise: jumper and cardio exercise machine; sporadic use       ADLs: ambulates with cane but uses rarely; performs most ADLs; minimal cooking; NO DRIVING.       Advanced Directives:  Not sure; DNR/DNI.    Sister:  [name] Loftis    No family history on file.     Objective:    BP 153/69 mmHg  Pulse 77  Temp(Src) 97.8 F (36.6 C) (Oral)  Resp 16  Ht 4' 11.5" (1.511 m)  Wt 111 lb (50.349 kg)  BMI 22.05 kg/m2  SpO2 95% Physical Exam  Constitutional: She is oriented to person, place, and time. She appears well-developed and well-nourished. No distress.  HENT:  Head: Normocephalic and atraumatic.  Right Ear: External ear normal.  Left Ear: External ear normal.  Nose: Nose normal.  Mouth/Throat: Oropharynx is clear and moist.  Eyes: Conjunctivae and EOM are normal. Pupils are equal, round, and reactive to light.  Neck: Normal range of motion. Neck supple. Carotid bruit is not present. No thyromegaly present.  Cardiovascular: Normal rate, regular rhythm, normal heart sounds and intact distal pulses.  Exam reveals no gallop and no friction rub.   No murmur heard. Pulmonary/Chest: Effort normal and breath sounds normal. She has no wheezes. She has no  rales.  Abdominal: Soft. Bowel sounds are normal. She exhibits no distension and no mass. There is no tenderness. There is no rebound and no guarding.  Lymphadenopathy:    She has no cervical adenopathy.  Neurological: She is alert and oriented to person, place, and time. No cranial nerve deficit.  Skin: Skin is warm and dry. No rash noted. She is not diaphoretic. No erythema. No pallor.  Psychiatric: She has a normal mood and affect. Her behavior is normal.   Depression screen [facility] 2/9 02/04/2016 07/22/2015 06/27/2015 01/16/2015 07/13/2014  Decreased Interest 0 0 0 0 0  Down, Depressed, Hopeless  0 0 0 0 0  PHQ - 2 Score 0 0 0 0 0    Fall Risk  02/04/2016 07/22/2015 06/27/2015 01/16/2015 07/13/2014  Falls in the past year? No No No No Yes  Number falls in past yr: - - - - -  Injury with Fall? - - - - No  Risk for fall due to : - - - - Impaired mobility       Assessment & Plan:   1. Encounter for Medicare annual wellness exam   2. Postoperative hypothyroidism   3. History of thyroid cancer   4. Osteoporosis   5. Insomnia   6. Degenerative disc disease, lumbar   7. Idiopathic scoliosis   8. Functional diarrhea     Orders Placed This Encounter  Procedures  . CBC with Differential/Platelet  . Comprehensive metabolic panel  . TSH  . T4, free  . Care order/instruction    AVS printed - let patient go!   Meds ordered this encounter  Medications  . OVER THE COUNTER MEDICATION    Sig:   . Diclofenac Sodium 1.5 % SOLN    Sig: Place 1.5 mLs onto the skin 4 (four) times daily.    Dispense:  150 mL    Refill:  1    Return in about 6 months (around 08/06/2016) for recheck.    Wong Steadham Elayne Guerin, M.D. [facility] [address] [place], [place]  [address] [phone] phone [phone] fax

## 2016-02-05 LAB — COMPREHENSIVE METABOLIC PANEL
ALBUMIN: 3.8 g/dL (ref 3.6–5.1)
ALK PHOS: 53 U/L (ref 33–130)
ALT: 14 U/L (ref 6–29)
AST: 23 U/L (ref 10–35)
BILIRUBIN TOTAL: 0.5 mg/dL (ref 0.2–1.2)
BUN: 21 mg/dL (ref 7–25)
CHLORIDE: 103 mmol/L (ref 98–110)
CO2: 27 mmol/L (ref 20–31)
CREATININE: 0.75 mg/dL (ref 0.60–0.88)
Calcium: 8.3 mg/dL — ABNORMAL LOW (ref 8.6–10.4)
Glucose, Bld: 91 mg/dL (ref 65–99)
Potassium: 4.2 mmol/L (ref 3.5–5.3)
SODIUM: 139 mmol/L (ref 135–146)
TOTAL PROTEIN: 6.1 g/dL (ref 6.1–8.1)

## 2016-02-05 LAB — T4, FREE: FREE T4: 1.6 ng/dL (ref 0.8–1.8)

## 2016-02-05 LAB — TSH: TSH: 1.73 m[IU]/L

## 2016-02-15 ENCOUNTER — Other Ambulatory Visit: Payer: Self-pay | Admitting: Family Medicine

## 2016-02-20 MED ORDER — LEVOTHYROXINE SODIUM 112 MCG PO TABS: 112.0000 ug | ORAL_TABLET | Freq: Every day | ORAL | Status: DC

## 2016-02-20 NOTE — Addendum Note (Signed)
Addended by: Wardell Honour on: 02/20/2016 11:17 AM   Modules accepted: Orders

## 2016-02-21 ENCOUNTER — Other Ambulatory Visit: Payer: Self-pay | Admitting: Family Medicine

## 2016-05-13 ENCOUNTER — Other Ambulatory Visit: Payer: Self-pay | Admitting: Family Medicine

## 2016-06-12 ENCOUNTER — Other Ambulatory Visit: Payer: Self-pay | Admitting: Family Medicine

## 2016-07-07 ENCOUNTER — Ambulatory Visit: Payer: Self-pay | Admitting: Family Medicine

## 2016-07-12 ENCOUNTER — Other Ambulatory Visit: Payer: Self-pay | Admitting: Family Medicine

## 2016-07-16 ENCOUNTER — Telehealth: Payer: Self-pay

## 2016-07-16 NOTE — Telephone Encounter (Signed)
Patient request to reschedule appointment with Dr. Tamala Julian due to bad weather. Patient want to know if he need to fast before scheduling next appointment. [phone]. Please leave detailed message on voicemail.

## 2016-07-21 ENCOUNTER — Ambulatory Visit: Payer: Medicare Other | Admitting: Family Medicine

## 2016-07-21 NOTE — Telephone Encounter (Signed)
Glucose was normal, I think we just need to recheck thyroid since treatment was sent in at the [facility]. Is this correct? Please confirm if you would like pt to fast or not. Thank you!

## 2016-07-22 NOTE — Telephone Encounter (Signed)
Correct. Pt does NOT need to be fasting for upcoming visit.

## 2016-07-24 NOTE — Telephone Encounter (Signed)
Called patient and left detailed message on VM.

## 2016-07-28 ENCOUNTER — Encounter: Payer: Self-pay | Admitting: Family Medicine

## 2016-07-28 ENCOUNTER — Ambulatory Visit (INDEPENDENT_AMBULATORY_CARE_PROVIDER_SITE_OTHER): Payer: Medicare Other | Admitting: Family Medicine

## 2016-07-28 VITALS — BP 142/90 | HR 86 | Temp 98.1°F | Resp 17 | Ht 59.5 in | Wt 104.8 lb

## 2016-07-28 DIAGNOSIS — R413 Other amnesia: Secondary | ICD-10-CM

## 2016-07-28 DIAGNOSIS — F5101 Primary insomnia: Secondary | ICD-10-CM

## 2016-07-28 DIAGNOSIS — M25561 Pain in right knee: Secondary | ICD-10-CM | POA: Diagnosis not present

## 2016-07-28 DIAGNOSIS — E890 Postprocedural hypothyroidism: Secondary | ICD-10-CM | POA: Diagnosis not present

## 2016-07-28 DIAGNOSIS — M5136 Other intervertebral disc degeneration, lumbar region: Secondary | ICD-10-CM | POA: Diagnosis not present

## 2016-07-28 DIAGNOSIS — G8929 Other chronic pain: Secondary | ICD-10-CM | POA: Diagnosis not present

## 2016-07-28 DIAGNOSIS — M51369 Other intervertebral disc degeneration, lumbar region without mention of lumbar back pain or lower extremity pain: Secondary | ICD-10-CM

## 2016-07-28 DIAGNOSIS — Z85850 Personal history of malignant neoplasm of thyroid: Secondary | ICD-10-CM

## 2016-07-28 DIAGNOSIS — M810 Age-related osteoporosis without current pathological fracture: Secondary | ICD-10-CM

## 2016-07-28 MED ORDER — MELOXICAM 7.5 MG PO TABS: ORAL_TABLET | ORAL | 5 refills | Status: DC

## 2016-07-28 MED ORDER — LEVOTHYROXINE SODIUM 100 MCG PO TABS: ORAL_TABLET | ORAL | 5 refills | Status: DC

## 2016-07-28 NOTE — Progress Notes (Signed)
By signing my name below, I, Patricia Campbell, attest that this documentation has been prepared under the direction and in the presence of Patricia Forts, MD.  Electronically Signed: Verlee Campbell, Medical Scribe. 07/28/2016. 5:50 PM.  Subjective:    Patient ID: Patricia Campbell, female    DOB: [birth year], 82 y.o.   MRN: [id number]  07/28/2016  Medication Refill (mobic)  HPI  HPI Comments: Patricia Campbell is a 82 y.o. female who presents to the [facility] for follow-up. She was seen 6 months ago for her annual wellness exam. I increased her thyroid medication at her last visit. Pt likes to paint things white and she like to read her bible and go to church on Sundays. Pt lives alone but her sister lives a few miles away from her. Pt still cooks all of her foods and occasionally she'll burn her food but not often. Pt takes mobic daily for her chronic back pain. Pt states she sleeps fine, and has a hard time hearing things. Pt stopped driving a long time ago.  Foot: Pt reports feeling a sore on her left toe  Left knee: Pt has left knee pain. Pt reports getting fluid removed from her left knee as needed.  GU: Pt reports having an intermittent bladder infection.  Fall Screening: Fall Risk  07/28/2016 02/04/2016 07/22/2015 06/27/2015 01/16/2015  Falls in the past year? No No No No No  Number falls in past yr: - - - - -  Injury with Fall? - - - - -  Risk for fall due to : - - - - -   Immunizations: Pt deferred the flu shot.  Depression: Pt denies dysphoric mood. Depression screen [facility] 2/9 07/28/2016 02/04/2016 07/22/2015 06/27/2015 01/16/2015  Decreased Interest 0 0 0 0 0  Down, Depressed, Hopeless 0 0 0 0 0  PHQ - 2 Score 0 0 0 0 0   Pt denies having any falls in the past 6 months, recent UTIs, currently being in pain, cognitive decline, chest pain with exertion, or recent chest pain.  Sister expresses concern with memory loss.  Review of Systems  Constitutional: Negative for  chills, diaphoresis, fatigue and fever.  HENT: Positive for hearing loss.   Eyes: Negative for visual disturbance.  Respiratory: Negative for cough and shortness of breath.   Cardiovascular: Negative for chest pain, palpitations and leg swelling.  Gastrointestinal: Negative for abdominal pain, constipation, diarrhea, nausea and vomiting.  Endocrine: Negative for cold intolerance, heat intolerance, polydipsia, polyphagia and polyuria.  Musculoskeletal: Positive for arthralgias and gait problem.  Neurological: Negative for dizziness, tremors, seizures, syncope, facial asymmetry, speech difficulty, weakness, light-headedness, numbness and headaches.  Psychiatric/Behavioral: Negative for dysphoric mood and sleep disturbance. The patient is not nervous/anxious.    Past Medical History:  Diagnosis Date  . Cancer [facility])    Thyroid cancer >> Thyroidectomy.  . Degenerative disc disease, lumbar   . Degenerative disc disease, thoracic   . Osteoporosis   . Scoliosis   . Thyroid disease    Past Surgical History:  Procedure Laterality Date  . TONSILLECTOMY    . TOTAL THYROIDECTOMY     Pt reports hx of thyroid cancer.   Allergies  Allergen Reactions  . Codeine     Social History   Social History  . Marital status: Widowed    Spouse name: N/A  . Number of children: N/A  . Years of education: N/A   Occupational History  . retired    Science writer  History Main Topics  . Smoking status: Never Smoker  . Smokeless tobacco: Not on file  . Alcohol use No  . Drug use: No  . Sexual activity: No   Other Topics Concern  . Not on file   Social History Narrative   Marital status:  Widowed x 2; second husband died in 56.       Children: 2 daughters (1 deceased; 1 living); several grandchildren.  Daughter in [place].  Never sees daughter.      Lives: alone in a trailer; youngest sister lives close.      Tobacco; never      Alcohol: none; "I come from a [place] family"       Exercise: jumper  and cardio exercise machine; sporadic use; works in the yard a lot; Arboriculturist a lot.       ADLs: ambulates with cane but uses rarely; performs most ADLs; minimal cooking; NO DRIVING.       Advanced Directives:  Not sure; DNR/DNI.    Sister:  Patricia Campbell    No family history on file.     Objective:    BP (!) 142/90 (BP Location: Left Arm, Patient Position: Sitting, Cuff Size: Normal)   Pulse 86   Temp 98.1 F (36.7 C) (Oral)   Resp 17   Ht 4' 11.5" (1.511 m)   Wt 104 lb 12.8 oz (47.5 kg)   SpO2 97%   BMI 20.81 kg/m  Physical Exam  Constitutional: She is oriented to person, place, and time. She appears well-developed and well-nourished. No distress.  HENT:  Head: Normocephalic and atraumatic.  Right Ear: External ear normal.  Left Ear: External ear normal.  Nose: Nose normal.  Mouth/Throat: Oropharynx is clear and moist.  Eyes: Conjunctivae and EOM are normal. Pupils are equal, round, and reactive to light.  Neck: Normal range of motion. Neck supple. Carotid bruit is not present. No thyromegaly present.  Cardiovascular: Normal rate, regular rhythm, normal heart sounds and intact distal pulses.  Exam reveals no gallop and no friction rub.   No murmur heard. Pulmonary/Chest: Effort normal and breath sounds normal. No respiratory distress. She has no wheezes. She has no rales.  Abdominal: Soft. Bowel sounds are normal. She exhibits no distension and no mass. There is no tenderness. There is no rebound and no guarding.  Musculoskeletal:  Hammer toe with callus formation.  Lymphadenopathy:    She has no cervical adenopathy.  Neurological: She is alert and oriented to person, place, and time. No cranial nerve deficit.  Skin: Skin is warm and dry. No rash noted. She is not diaphoretic. No erythema. No pallor.  Psychiatric: She has a normal mood and affect. Her behavior is normal.  Nursing note and vitals reviewed.    Assessment & Plan:   1. Postoperative hypothyroidism   2.  History of thyroid cancer   3. Osteoporosis   4. Degenerative disc disease, lumbar   5. Insomnia   6. Arthralgia of knee, right   7. Pain in right knee    -controlled; obtain labs; refills provided. -benefit of daily Mobic use outweighs potential risks; daily Mobic improves patient's quality of life. Put cotton inbetween your toes and wear more comfortable shoes to allow your feet to spread. -memory loss noted; no longer driving; continues to cook yet no recent issues; good support from sister who lives close to patient.  Consider addition of Aricept at next visit.   Orders Placed This Encounter  Procedures  . CBC with  Differential/Platelet  . Comprehensive metabolic panel  . TSH  . T4, free   Meds ordered this encounter  Medications  . levothyroxine (SYNTHROID, LEVOTHROID) 100 MCG tablet    Sig: take 1 tablet by mouth every morning ON AN EMPTY STOMACH    Dispense:  30 tablet    Refill:  5  . meloxicam (MOBIC) 7.5 MG tablet    Sig: Take one daily after breakfast for pain and inflammation of knee.    Dispense:  30 tablet    Refill:  5    Return in about 6 months (around 01/25/2017) for complete physical examiniation.  I personally performed the services described in this documentation, which was scribed in my presence. The recorded information has been reviewed and considered.  Norie Latendresse Elayne Guerin, M.D. [facility] [address] [place], [place]  [address] [phone] phone [phone] fax

## 2016-07-28 NOTE — Patient Instructions (Signed)
     IF you received an x-ray today, you will receive an invoice from [facility]. Please contact [facility] at [phone] with questions or concerns regarding your invoice.   IF you received labwork today, you will receive an invoice from [facility]. Please contact [facility] at [phone] with questions or concerns regarding your invoice.   Our billing staff will not be able to assist you with questions regarding bills from these companies.  You will be contacted with the lab results as soon as they are available. The fastest way to get your results is to activate your My Chart account. Instructions are located on the last page of this paperwork. If you have not heard from us regarding the results in 2 weeks, please contact this office.      

## 2016-07-29 LAB — TSH: TSH: 0.2 mIU/L — ABNORMAL LOW

## 2016-07-29 LAB — CBC WITH DIFFERENTIAL/PLATELET
Basophils Absolute: 0 cells/uL (ref 0–200)
Basophils Relative: 0 %
EOS PCT: 1 %
Eosinophils Absolute: 71 cells/uL (ref 15–500)
HCT: 37.3 % (ref 35.0–45.0)
Hemoglobin: 12.2 g/dL (ref 11.7–15.5)
LYMPHS PCT: 30 %
Lymphs Abs: 2130 cells/uL (ref 850–3900)
MCH: 29 pg (ref 27.0–33.0)
MCHC: 32.7 g/dL (ref 32.0–36.0)
MCV: 88.6 fL (ref 80.0–100.0)
MPV: 11 fL (ref 7.5–12.5)
Monocytes Absolute: 639 cells/uL (ref 200–950)
Monocytes Relative: 9 %
NEUTROS PCT: 60 %
Neutro Abs: 4260 cells/uL (ref 1500–7800)
Platelets: 205 10*3/uL (ref 140–400)
RBC: 4.21 MIL/uL (ref 3.80–5.10)
RDW: 15.2 % — AB (ref 11.0–15.0)
WBC: 7.1 10*3/uL (ref 3.8–10.8)

## 2016-07-29 LAB — COMPREHENSIVE METABOLIC PANEL
ALT: 14 U/L (ref 6–29)
AST: 24 U/L (ref 10–35)
Albumin: 4.1 g/dL (ref 3.6–5.1)
Alkaline Phosphatase: 55 U/L (ref 33–130)
BILIRUBIN TOTAL: 0.3 mg/dL (ref 0.2–1.2)
BUN: 22 mg/dL (ref 7–25)
CO2: 28 mmol/L (ref 20–31)
CREATININE: 0.81 mg/dL (ref 0.60–0.88)
Calcium: 8.9 mg/dL (ref 8.6–10.4)
Chloride: 103 mmol/L (ref 98–110)
GLUCOSE: 100 mg/dL — AB (ref 65–99)
Potassium: 4.4 mmol/L (ref 3.5–5.3)
SODIUM: 138 mmol/L (ref 135–146)
Total Protein: 6.5 g/dL (ref 6.1–8.1)

## 2016-07-29 LAB — T4, FREE: Free T4: 1.9 ng/dL — ABNORMAL HIGH (ref 0.8–1.8)

## 2016-08-09 ENCOUNTER — Other Ambulatory Visit: Payer: Self-pay | Admitting: Family Medicine

## 2016-08-11 NOTE — Telephone Encounter (Signed)
07/28/2016 last ov and labs

## 2016-08-29 DIAGNOSIS — R413 Other amnesia: Secondary | ICD-10-CM | POA: Insufficient documentation

## 2016-10-26 ENCOUNTER — Encounter: Payer: Self-pay | Admitting: Family Medicine

## 2017-02-23 ENCOUNTER — Ambulatory Visit: Payer: Medicare Other | Admitting: Family Medicine

## 2017-03-02 ENCOUNTER — Encounter: Payer: Medicare Other | Admitting: Family Medicine

## 2017-03-10 ENCOUNTER — Ambulatory Visit: Payer: Medicare Other | Admitting: Family Medicine

## 2017-03-30 ENCOUNTER — Encounter: Payer: Self-pay | Admitting: Family Medicine

## 2017-03-30 ENCOUNTER — Ambulatory Visit (INDEPENDENT_AMBULATORY_CARE_PROVIDER_SITE_OTHER): Payer: Medicare HMO | Admitting: Family Medicine

## 2017-03-30 ENCOUNTER — Ambulatory Visit (INDEPENDENT_AMBULATORY_CARE_PROVIDER_SITE_OTHER): Payer: Medicare HMO

## 2017-03-30 ENCOUNTER — Other Ambulatory Visit: Payer: Self-pay | Admitting: Family Medicine

## 2017-03-30 VITALS — BP 137/67 | HR 85 | Temp 97.6°F | Resp 16 | Ht 59.0 in | Wt 102.0 lb

## 2017-03-30 DIAGNOSIS — Z0000 Encounter for general adult medical examination without abnormal findings: Secondary | ICD-10-CM

## 2017-03-30 DIAGNOSIS — M41125 Adolescent idiopathic scoliosis, thoracolumbar region: Secondary | ICD-10-CM | POA: Diagnosis not present

## 2017-03-30 DIAGNOSIS — M810 Age-related osteoporosis without current pathological fracture: Secondary | ICD-10-CM

## 2017-03-30 DIAGNOSIS — M25562 Pain in left knee: Secondary | ICD-10-CM | POA: Diagnosis not present

## 2017-03-30 DIAGNOSIS — M25561 Pain in right knee: Secondary | ICD-10-CM | POA: Diagnosis not present

## 2017-03-30 DIAGNOSIS — E7800 Pure hypercholesterolemia, unspecified: Secondary | ICD-10-CM | POA: Diagnosis not present

## 2017-03-30 DIAGNOSIS — E890 Postprocedural hypothyroidism: Secondary | ICD-10-CM | POA: Diagnosis not present

## 2017-03-30 DIAGNOSIS — G8929 Other chronic pain: Secondary | ICD-10-CM | POA: Diagnosis not present

## 2017-03-30 DIAGNOSIS — Z85850 Personal history of malignant neoplasm of thyroid: Secondary | ICD-10-CM

## 2017-03-30 DIAGNOSIS — R413 Other amnesia: Secondary | ICD-10-CM | POA: Diagnosis not present

## 2017-03-30 DIAGNOSIS — M5136 Other intervertebral disc degeneration, lumbar region: Secondary | ICD-10-CM | POA: Diagnosis not present

## 2017-03-30 LAB — POCT URINALYSIS DIP (MANUAL ENTRY)
Bilirubin, UA: NEGATIVE
GLUCOSE UA: NEGATIVE mg/dL
NITRITE UA: NEGATIVE
Protein Ur, POC: NEGATIVE mg/dL
RBC UA: NEGATIVE
Spec Grav, UA: 1.025 (ref 1.010–1.025)
UROBILINOGEN UA: 0.2 U/dL
pH, UA: 5.5 (ref 5.0–8.0)

## 2017-03-30 MED ORDER — MELOXICAM 7.5 MG PO TABS: ORAL_TABLET | ORAL | 5 refills | Status: DC

## 2017-03-30 MED ORDER — ALENDRONATE SODIUM 70 MG PO TABS: ORAL_TABLET | ORAL | 11 refills | Status: DC

## 2017-03-30 MED ORDER — LEVOTHYROXINE SODIUM 100 MCG PO TABS: ORAL_TABLET | ORAL | 11 refills | Status: DC

## 2017-03-30 NOTE — Progress Notes (Signed)
   Subjective:    Patient ID: Patricia Campbell, female    DOB: [birth year], 83 y.o.   MRN: [id number]  HPI    Review of Systems  Eyes: Positive for photophobia.  Gastrointestinal: Positive for diarrhea.  Genitourinary: Positive for frequency.       Objective:   Physical Exam        Assessment & Plan:

## 2017-03-30 NOTE — Progress Notes (Signed)
Subjective:    Patient ID: Patricia Campbell, female    DOB: Dec 01, 1926, 83 y.o.   MRN: [id number]  03/30/2017  Annual Exam (medicare)   HPI This 83 y.o. female presents with sister for Annual Wellness Examination, Complete Physical examination, and follow-up of chronic medical conditions.  Last physical:  02/04/16 Pap smear: n/a Mammogram:  n/a Colonoscopy:  N/a; years ago Bone density:  2013; taking Fosamax. Eye exam:  Due now; +Glasses; decided against cataract surgery Dental exam:  Overdue for dentist.   There is no immunization history on file for this patient. BP Readings from Last 3 Encounters:  03/30/17 137/67  07/28/16 (!) 142/90  02/04/16 (!) 153/69   Wt Readings from Last 3 Encounters:  03/30/17 102 lb (46.3 kg)  07/28/16 104 lb 12.8 oz (47.5 kg)  02/04/16 111 lb (50.3 kg)    Thyroid cancer hx: Patient reports good compliance with medication, good tolerance to medication, and good symptom control.    Osteoporosis: Patient reports good compliance with medication, good tolerance to medication, and good symptom control.    DDD lumbar: suffers with intermittent lower back pain.  LEFT knee pain: intermittent; has been hurting and swelling lately; pain with weight bearing.  Has run out of Meloxicam.    Review of Systems  Constitutional: Negative for activity change, appetite change, chills, diaphoresis, fatigue, fever and unexpected weight change.  HENT: Negative for congestion, dental problem, drooling, ear discharge, ear pain, facial swelling, hearing loss, mouth sores, nosebleeds, postnasal drip, rhinorrhea, sinus pressure, sneezing, sore throat, tinnitus, trouble swallowing and voice change.   Eyes: Negative for photophobia, pain, discharge, redness, itching and visual disturbance.  Respiratory: Negative for apnea, cough, choking, chest tightness, shortness of breath, wheezing and stridor.   Cardiovascular: Negative for chest pain, palpitations and leg swelling.    Gastrointestinal: Negative for abdominal distention, abdominal pain, anal bleeding, blood in stool, constipation, diarrhea, nausea, rectal pain and vomiting.  Endocrine: Negative for cold intolerance, heat intolerance, polydipsia, polyphagia and polyuria.  Genitourinary: Negative for decreased urine volume, difficulty urinating, dyspareunia, dysuria, enuresis, flank pain, frequency, genital sores, hematuria, menstrual problem, pelvic pain, urgency, vaginal bleeding, vaginal discharge and vaginal pain.  Musculoskeletal: Positive for arthralgias, back pain, gait problem and joint swelling. Negative for myalgias, neck pain and neck stiffness.  Skin: Negative for color change, pallor, rash and wound.  Allergic/Immunologic: Negative for environmental allergies, food allergies and immunocompromised state.  Neurological: Negative for dizziness, tremors, seizures, syncope, facial asymmetry, speech difficulty, weakness, light-headedness, numbness and headaches.  Hematological: Negative for adenopathy. Does not bruise/bleed easily.  Psychiatric/Behavioral: Positive for decreased concentration. Negative for agitation, behavioral problems, confusion, dysphoric mood, hallucinations, self-injury, sleep disturbance and suicidal ideas. The patient is not nervous/anxious and is not hyperactive.     Past Medical History:  Diagnosis Date  . Cancer [facility])    Thyroid cancer >> Thyroidectomy.  . Degenerative disc disease, lumbar   . Degenerative disc disease, thoracic   . Osteoporosis   . Scoliosis   . Thyroid disease    Past Surgical History:  Procedure Laterality Date  . TONSILLECTOMY    . TOTAL THYROIDECTOMY     Pt reports hx of thyroid cancer.   Allergies  Allergen Reactions  . Codeine     Social History   Social History  . Marital status: Widowed    Spouse name: N/A  . Number of children: N/A  . Years of education: N/A   Occupational History  . retired    Science writer  History Main Topics  .  Smoking status: Never Smoker  . Smokeless tobacco: Never Used  . Alcohol use No  . Drug use: No  . Sexual activity: No   Other Topics Concern  . Not on file   Social History Narrative   Marital status:  Widowed x 2; second husband died in 48.  Not dating.      Children: 2 daughters (1 deceased; 1 living); several grandchildren.  Daughter in [place].  Never sees daughter.      Lives: alone in a trailer; youngest sister lives close.      Tobacco; never      Alcohol: none; "I come from a [place] family"       Exercise: jumper and cardio exercise machine; sporadic use; works in the yard a lot; Arboriculturist a lot.       ADLs: ambulates with cane but uses rarely; performs most ADLs; minimal cooking; NO DRIVING.       Advanced Directives:  Not sure; DNR/DNI.    Sister:  [name] Loftis    History reviewed. No pertinent family history.     Objective:    BP 137/67   Pulse 85   Temp 97.6 F (36.4 C) (Oral)   Resp 16   Ht 4\' 11"  (1.499 m)   Wt 102 lb (46.3 kg)   SpO2 94%   BMI 20.60 kg/m  Physical Exam  Constitutional: She is oriented to person, place, and time. She appears well-developed and well-nourished. No distress.  HENT:  Head: Normocephalic and atraumatic.  Right Ear: External ear normal.  Left Ear: External ear normal.  Nose: Nose normal.  Mouth/Throat: Oropharynx is clear and moist.  Eyes: Conjunctivae and EOM are normal. Pupils are equal, round, and reactive to light.  Neck: Normal range of motion and full passive range of motion without pain. Neck supple. No JVD present. Carotid bruit is not present. No thyromegaly present.  Cardiovascular: Normal rate, regular rhythm and normal heart sounds.  Exam reveals no gallop and no friction rub.   No murmur heard. Pulmonary/Chest: Effort normal and breath sounds normal. She has no wheezes. She has no rales.  Abdominal: Soft. Bowel sounds are normal. She exhibits no distension and no mass. There is no tenderness. There is no  rebound and no guarding.  Musculoskeletal:       Right shoulder: Normal.       Left shoulder: Normal.       Left knee: She exhibits decreased range of motion and swelling. Tenderness found. Medial joint line tenderness noted. No lateral joint line tenderness noted.       Cervical back: Normal.  Lymphadenopathy:    She has no cervical adenopathy.  Neurological: She is alert and oriented to person, place, and time. She has normal reflexes. No cranial nerve deficit. She exhibits normal muscle tone. Coordination normal.  Skin: Skin is warm and dry. No rash noted. She is not diaphoretic. No erythema. No pallor.  Psychiatric: She has a normal mood and affect. Her behavior is normal. Judgment and thought content normal.  Nursing note and vitals reviewed.  Depression screen [facility] 2/9 03/30/2017 07/28/2016 02/04/2016 07/22/2015 06/27/2015  Decreased Interest 0 0 0 0 0  Down, Depressed, Hopeless 0 0 0 0 0  PHQ - 2 Score 0 0 0 0 0   Fall Risk  03/30/2017 07/28/2016 02/04/2016 07/22/2015 06/27/2015  Falls in the past year? No No No No No  Number falls in past yr: - - - - -  Injury with Fall? - - - - -  Risk for fall due to : - - - - -   Functional Status Survey: Is the patient deaf or have difficulty hearing?: Yes Does the patient have difficulty seeing, even when wearing glasses/contacts?: No Does the patient have difficulty concentrating, remembering, or making decisions?: No Does the patient have difficulty walking or climbing stairs?: No Does the patient have difficulty dressing or bathing?: No Does the patient have difficulty doing errands alone such as visiting a doctor's office or shopping?: Yes      Assessment & Plan:   1. Encounter for Medicare annual wellness exam   2. Routine physical examination   3. Postoperative hypothyroidism   4. Age-related osteoporosis without current pathological fracture   5. Adolescent idiopathic scoliosis of thoracolumbar region   6. Degenerative disc disease,  lumbar   7. History of thyroid cancer   8. Memory loss   9. Pure hypercholesterolemia   10. Arthralgia of knee, right   11. Chronic pain of right knee   12. Lateral knee pain, left    -anticipatory guidance provided --- exercise, weight loss, safe driving practices, aspirin 81mg  daily. -obtain age appropriate screening labs and labs for chronic disease management. -moderate fall risk; no evidence of depression; no evidence of hearing loss.  Discussed advanced directives and living will; also discussed end of life issues including code status.  -acute onset LEFT knee pain; obtain LEFT knee xray; recommend rest, icing, elevation.  Rx for Meloxicam provided. -has mild memory loss; good family support; no longer driving yet able to perform ADLs.    Orders Placed This Encounter  Procedures  . DG Knee Complete 4 Views Left    Standing Status:   Future    Number of Occurrences:   1    Standing Expiration Date:   03/30/2018    Order Specific Question:   Reason for Exam (SYMPTOM  OR DIAGNOSIS REQUIRED)    Answer:   L lateral knee pain    Order Specific Question:   Preferred imaging location?    Answer:   External  . CBC with Differential/Platelet  . Comprehensive metabolic panel    Order Specific Question:   Has the patient fasted?    Answer:   Yes  . Lipid panel    Order Specific Question:   Has the patient fasted?    Answer:   Yes  . T4, free  . TSH  . RPR  . Vitamin B12  . POCT urinalysis dipstick   Meds ordered this encounter  Medications  . alendronate (FOSAMAX) 70 MG tablet    Sig: take 1 tablet by mouth every week WITH FULL GLASS OF WATER ON AN EMPTY STOMACH    Dispense:  4 tablet    Refill:  11  . levothyroxine (SYNTHROID, LEVOTHROID) 100 MCG tablet    Sig: take 1 tablet by mouth every morning ON AN EMPTY STOMACH    Dispense:  30 tablet    Refill:  11  . meloxicam (MOBIC) 7.5 MG tablet    Sig: Take one daily after breakfast for pain and inflammation of knee.     Dispense:  30 tablet    Refill:  5    Return in about 6 months (around 09/30/2017) for recheck knee pain, thyroid.   Uno Esau Elayne Guerin, M.D. Primary Care at [facility] previously [facility] [place] [address] [place], [place]  [address] [phone] phone [phone] fax

## 2017-03-30 NOTE — Patient Instructions (Addendum)
   IF you received an x-ray today, you will receive an invoice from [facility]. Please contact [facility] at [phone] with questions or concerns regarding your invoice.   IF you received labwork today, you will receive an invoice from [facility]. Please contact [facility] at [phone] with questions or concerns regarding your invoice.   Our billing staff will not be able to assist you with questions regarding bills from these companies.  You will be contacted with the lab results as soon as they are available. The fastest way to get your results is to activate your My Chart account. Instructions are located on the last page of this paperwork. If you have not heard from us regarding the results in 2 weeks, please contact this office.      Preventive Care 81 Years and Older, Female Preventive care refers to lifestyle choices and visits with your health care provider that can promote health and wellness. What does preventive care include?  A yearly physical exam. This is also called an annual well check.  Dental exams once or twice a year.  Routine eye exams. Ask your health care provider how often you should have your eyes checked.  Personal lifestyle choices, including:  Daily care of your teeth and gums.  Regular physical activity.  Eating a healthy diet.  Avoiding tobacco and drug use.  Limiting alcohol use.  Practicing safe sex.  Taking low-dose aspirin every day.  Taking vitamin and mineral supplements as recommended by your health care provider. What happens during an annual well check? The services and screenings done by your health care provider during your annual well check will depend on your age, overall health, lifestyle risk factors, and family history of disease. Counseling  Your health care provider may ask you questions about your:  Alcohol use.  Tobacco use.  Drug use.  Emotional well-being.  Home and relationship  well-being.  Sexual activity.  Eating habits.  History of falls.  Memory and ability to understand (cognition).  Work and work environment.  Reproductive health. Screening  You may have the following tests or measurements:  Height, weight, and BMI.  Blood pressure.  Lipid and cholesterol levels. These may be checked every 5 years, or more frequently if you are over 50 years old.  Skin check.  Lung cancer screening. You may have this screening every year starting at age 81 if you have a 30-pack-year history of smoking and currently smoke or have quit within the past 15 years.  Fecal occult blood test (FOBT) of the stool. You may have this test every year starting at age 81.  Flexible sigmoidoscopy or colonoscopy. You may have a sigmoidoscopy every 5 years or a colonoscopy every 10 years starting at age 81.  Hepatitis C blood test.  Hepatitis B blood test.  Sexually transmitted disease (STD) testing.  Diabetes screening. This is done by checking your blood sugar (glucose) after you have not eaten for a while (fasting). You may have this done every 1-3 years.  Bone density scan. This is done to screen for osteoporosis. You may have this done starting at age 81.  Mammogram. This may be done every 1-2 years. Talk to your health care provider about how often you should have regular mammograms. Talk with your health care provider about your test results, treatment options, and if necessary, the need for more tests. Vaccines  Your health care provider may recommend certain vaccines, such as:  Influenza vaccine. This is recommended every year.    Tetanus, diphtheria, and acellular pertussis (Tdap, Td) vaccine. You may need a Td booster every 10 years.  Varicella vaccine. You may need this if you have not been vaccinated.  Zoster vaccine. You may need this after age 81.  Measles, mumps, and rubella (MMR) vaccine. You may need at least one dose of MMR if you were born in 1957  or later. You may also need a second dose.  Pneumococcal 13-valent conjugate (PCV13) vaccine. One dose is recommended after age 81.  Pneumococcal polysaccharide (PPSV23) vaccine. One dose is recommended after age 81.  Meningococcal vaccine. You may need this if you have certain conditions.  Hepatitis A vaccine. You may need this if you have certain conditions or if you travel or work in places where you may be exposed to hepatitis A.  Hepatitis B vaccine. You may need this if you have certain conditions or if you travel or work in places where you may be exposed to hepatitis B.  Haemophilus influenzae type b (Hib) vaccine. You may need this if you have certain conditions. Talk to your health care provider about which screenings and vaccines you need and how often you need them. This information is not intended to replace advice given to you by your health care provider. Make sure you discuss any questions you have with your health care provider. Document Released: 11/22/2015 Document Revised: 07/15/2016 Document Reviewed: 08/27/2015 Elsevier Interactive Patient Education  2017 [employer].

## 2017-03-31 LAB — CBC WITH DIFFERENTIAL/PLATELET
BASOS: 1 %
Basophils Absolute: 0 10*3/uL (ref 0.0–0.2)
EOS (ABSOLUTE): 0.1 10*3/uL (ref 0.0–0.4)
EOS: 1 %
HEMATOCRIT: 38.9 % (ref 34.0–46.6)
HEMOGLOBIN: 12.1 g/dL (ref 11.1–15.9)
IMMATURE GRANS (ABS): 0 10*3/uL (ref 0.0–0.1)
Immature Granulocytes: 0 %
LYMPHS ABS: 1.7 10*3/uL (ref 0.7–3.1)
Lymphs: 28 %
MCH: 28.2 pg (ref 26.6–33.0)
MCHC: 31.1 g/dL — AB (ref 31.5–35.7)
MCV: 91 fL (ref 79–97)
MONOCYTES: 11 %
Monocytes Absolute: 0.7 10*3/uL (ref 0.1–0.9)
NEUTROS ABS: 3.5 10*3/uL (ref 1.4–7.0)
Neutrophils: 59 %
Platelets: 183 10*3/uL (ref 150–379)
RBC: 4.29 x10E6/uL (ref 3.77–5.28)
RDW: 15.1 % (ref 12.3–15.4)
WBC: 6 10*3/uL (ref 3.4–10.8)

## 2017-03-31 LAB — COMPREHENSIVE METABOLIC PANEL
ALBUMIN: 3.9 g/dL (ref 3.5–4.7)
ALK PHOS: 62 IU/L (ref 39–117)
ALT: 14 IU/L (ref 0–32)
AST: 26 IU/L (ref 0–40)
Albumin/Globulin Ratio: 1.6 (ref 1.2–2.2)
BUN / CREAT RATIO: 23 (ref 12–28)
BUN: 21 mg/dL (ref 8–27)
Bilirubin Total: 0.3 mg/dL (ref 0.0–1.2)
CO2: 25 mmol/L (ref 18–29)
CREATININE: 0.92 mg/dL (ref 0.57–1.00)
Calcium: 8.4 mg/dL — ABNORMAL LOW (ref 8.7–10.3)
Chloride: 103 mmol/L (ref 96–106)
GFR calc non Af Amer: 56 mL/min/{1.73_m2} — ABNORMAL LOW (ref >59)
GFR, EST AFRICAN AMERICAN: 64 mL/min/{1.73_m2} (ref >59)
GLOBULIN, TOTAL: 2.4 g/dL (ref 1.5–4.5)
Glucose: 95 mg/dL (ref 65–99)
Potassium: 4.2 mmol/L (ref 3.5–5.2)
SODIUM: 142 mmol/L (ref 134–144)
TOTAL PROTEIN: 6.3 g/dL (ref 6.0–8.5)

## 2017-03-31 LAB — VITAMIN B12: Vitamin B-12: >2000 pg/mL — ABNORMAL HIGH (ref 232–1245)

## 2017-03-31 LAB — LIPID PANEL
CHOL/HDL RATIO: 2.6 ratio (ref 0.0–4.4)
Cholesterol, Total: 172 mg/dL (ref 100–199)
HDL: 65 mg/dL (ref >39)
LDL CALC: 93 mg/dL (ref 0–99)
Triglycerides: 70 mg/dL (ref 0–149)
VLDL CHOLESTEROL CAL: 14 mg/dL (ref 5–40)

## 2017-03-31 LAB — TSH: TSH: 0.199 u[IU]/mL — AB (ref 0.450–4.500)

## 2017-03-31 LAB — RPR: RPR: NONREACTIVE

## 2017-03-31 LAB — T4, FREE: FREE T4: 2.3 ng/dL — AB (ref 0.82–1.77)

## 2017-05-09 ENCOUNTER — Other Ambulatory Visit: Payer: Self-pay | Admitting: Family Medicine

## 2017-05-09 MED ORDER — LEVOTHYROXINE SODIUM 75 MCG PO TABS: ORAL_TABLET | ORAL | 5 refills | Status: DC

## 2017-09-27 ENCOUNTER — Ambulatory Visit (INDEPENDENT_AMBULATORY_CARE_PROVIDER_SITE_OTHER): Payer: Medicare HMO | Admitting: Family Medicine

## 2017-09-27 ENCOUNTER — Encounter: Payer: Self-pay | Admitting: Family Medicine

## 2017-09-27 ENCOUNTER — Other Ambulatory Visit: Payer: Self-pay

## 2017-09-27 VITALS — BP 140/82 | HR 92 | Temp 98.0°F | Resp 16 | Ht 59.06 in | Wt 97.0 lb

## 2017-09-27 DIAGNOSIS — E890 Postprocedural hypothyroidism: Secondary | ICD-10-CM | POA: Diagnosis not present

## 2017-09-27 DIAGNOSIS — M810 Age-related osteoporosis without current pathological fracture: Secondary | ICD-10-CM | POA: Diagnosis not present

## 2017-09-27 DIAGNOSIS — Z85850 Personal history of malignant neoplasm of thyroid: Secondary | ICD-10-CM

## 2017-09-27 DIAGNOSIS — G8929 Other chronic pain: Secondary | ICD-10-CM

## 2017-09-27 DIAGNOSIS — M25561 Pain in right knee: Secondary | ICD-10-CM | POA: Diagnosis not present

## 2017-09-27 DIAGNOSIS — R413 Other amnesia: Secondary | ICD-10-CM | POA: Diagnosis not present

## 2017-09-27 MED ORDER — MELOXICAM 7.5 MG PO TABS: ORAL_TABLET | ORAL | 5 refills | Status: DC

## 2017-09-27 MED ORDER — LEVOTHYROXINE SODIUM 75 MCG PO TABS: ORAL_TABLET | ORAL | 5 refills | Status: DC

## 2017-09-27 NOTE — Progress Notes (Signed)
Subjective:    Patient ID: Patricia Campbell, female    DOB: 1927/01/24, 83 y.o.   MRN: [id number]  09/27/2017  Knee Pain (6 month follow-up ) and Hypothyroidism    HPI This 83 y.o. female presents for evaluation of LEFT KNEE PAIN, hypothyroidism s/p thyroidectomy, osteopenia, memory loss.  Management changes made at last visit include the following:  -acute onset LEFT knee pain; obtain LEFT knee xray; recommend rest, icing, elevation.  Rx for Meloxicam provided. -has mild memory loss; good family support; no longer driving yet able to perform ADLs.  Lab results as follows from last visit: 1. Patient continues to receive too much thyroid supplementation; I need to lower her dose again. I have decreased Levothyroxine to 21mcg daily.  2. Anemia has resolved.   Patient reports good compliance with medication, good tolerance to medication, and good symptom control.   Knee pain persists but has improved from last visit.  No falls.   BP Readings from Last 3 Encounters:  09/27/17 140/82  03/30/17 137/67  07/28/16 (!) 142/90   Wt Readings from Last 3 Encounters:  09/27/17 97 lb (44 kg)  03/30/17 102 lb (46.3 kg)  07/28/16 104 lb 12.8 oz (47.5 kg)    There is no immunization history on file for this patient.  Review of Systems  Constitutional: Negative for chills, diaphoresis, fatigue and fever.  Eyes: Negative for visual disturbance.  Respiratory: Negative for cough and shortness of breath.   Cardiovascular: Negative for chest pain, palpitations and leg swelling.  Gastrointestinal: Negative for abdominal pain, constipation, diarrhea, nausea and vomiting.  Endocrine: Negative for cold intolerance, heat intolerance, polydipsia, polyphagia and polyuria.  Musculoskeletal: Positive for arthralgias and joint swelling.  Neurological: Negative for dizziness, tremors, seizures, syncope, facial asymmetry, speech difficulty, weakness, light-headedness, numbness and headaches.    Psychiatric/Behavioral: Negative for dysphoric mood, self-injury, sleep disturbance and suicidal ideas. The patient is not nervous/anxious.     Past Medical History:  Diagnosis Date  . Cancer [facility])    Thyroid cancer >> Thyroidectomy.  . Degenerative disc disease, lumbar   . Degenerative disc disease, thoracic   . Osteoporosis   . Scoliosis   . Thyroid disease    Past Surgical History:  Procedure Laterality Date  . TONSILLECTOMY    . TOTAL THYROIDECTOMY     Pt reports hx of thyroid cancer.   Allergies  Allergen Reactions  . Codeine    Current Outpatient Medications on File Prior to Visit  Medication Sig Dispense Refill  . alendronate (FOSAMAX) 70 MG tablet take 1 tablet by mouth every week WITH FULL GLASS OF WATER ON AN EMPTY STOMACH 4 tablet 11  . Multiple Vitamin (MULTIVITAMIN) tablet Take 1 tablet by mouth daily.    Marland Kitchen OVER THE COUNTER MEDICATION Reported on 02/04/2016    . OVER THE COUNTER MEDICATION Focus Factor taking one daily    . OVER THE COUNTER MEDICATION Vitamin E 400 iu daily    . OVER THE COUNTER MEDICATION Reported on 02/04/2016    . OVER THE COUNTER MEDICATION Alfalfa 500 mg taking  2 a day    . OVER THE COUNTER MEDICATION Co Q 10 100 mg taking one daily    . OVER THE COUNTER MEDICATION D 3 2000 iu taking daily    . OVER THE COUNTER MEDICATION Vitamin C 500 mg prn    . OVER THE COUNTER MEDICATION Reported on 02/04/2016    . OVER THE COUNTER MEDICATION      No  current facility-administered medications on file prior to visit.    Social History   Socioeconomic History  . Marital status: Widowed    Spouse name: Not on file  . Number of children: Not on file  . Years of education: Not on file  . Highest education level: Not on file  Social Needs  . Financial resource strain: Not on file  . Food insecurity - worry: Not on file  . Food insecurity - inability: Not on file  . Transportation needs - medical: Not on file  . Transportation needs - non-medical:  Not on file  Occupational History  . Occupation: retired  Tobacco Use  . Smoking status: Never Smoker  . Smokeless tobacco: Never Used  Substance and Sexual Activity  . Alcohol use: No  . Drug use: No  . Sexual activity: No  Other Topics Concern  . Not on file  Social History Narrative   Marital status:  Widowed x 2; second husband died in 6.  Not dating.      Children: 2 daughters (1 deceased bone cancer at age 31); 9 living); several grandchildren.  Daughter in [place].  Never sees daughter.      Lives: alone in a trailer; youngest sister (10 year old sister) lives close.      Tobacco; never      Alcohol: none; "I come from a [place] family"       Exercise: jumper and cardio exercise machine; sporadic use; works in the yard a lot; Arboriculturist a lot.       ADLs: ambulates with cane but uses rarely; performs most ADLs; minimal cooking; NO DRIVING.       Advanced Directives:  Not sure; DNR/DNI.    Sister:  [name] Loftis    History reviewed. No pertinent family history.     Objective:    BP 140/82   Pulse 92   Temp 98 F (36.7 C) (Oral)   Resp 16   Ht 4' 11.06" (1.5 m)   Wt 97 lb (44 kg)   SpO2 96%   BMI 19.56 kg/m  Physical Exam  Constitutional: She is oriented to person, place, and time. She appears well-developed and well-nourished. No distress.  HENT:  Head: Normocephalic and atraumatic.  Right Ear: External ear normal.  Left Ear: External ear normal.  Nose: Nose normal.  Mouth/Throat: Oropharynx is clear and moist.  Eyes: Conjunctivae and EOM are normal. Pupils are equal, round, and reactive to light.  Neck: Normal range of motion. Neck supple. Carotid bruit is not present. No thyromegaly present.  Cardiovascular: Normal rate, regular rhythm, normal heart sounds and intact distal pulses. Exam reveals no gallop and no friction rub.  No murmur heard. Pulmonary/Chest: Effort normal and breath sounds normal. She has no wheezes. She has no rales.  Abdominal: Soft.  Bowel sounds are normal. She exhibits no distension and no mass. There is no tenderness. There is no rebound and no guarding.  Musculoskeletal:       Left knee: She exhibits decreased range of motion and swelling. She exhibits no effusion, no ecchymosis, no deformity, no laceration and no erythema. No tenderness found. No medial joint line and no lateral joint line tenderness noted.  Lymphadenopathy:    She has no cervical adenopathy.  Neurological: She is alert and oriented to person, place, and time. No cranial nerve deficit.  Skin: Skin is warm and dry. No rash noted. She is not diaphoretic. No erythema. No pallor.  Psychiatric: She has a normal  mood and affect. Her behavior is normal.   No results found. Depression screen [facility] 2/9 09/27/2017 03/30/2017 07/28/2016 02/04/2016 07/22/2015  Decreased Interest 0 0 0 0 0  Down, Depressed, Hopeless 0 0 0 0 0  PHQ - 2 Score 0 0 0 0 0   Fall Risk  09/27/2017 03/30/2017 07/28/2016 02/04/2016 07/22/2015  Falls in the past year? No No No No No  Number falls in past yr: - - - - -  Injury with Fall? - - - - -  Risk for fall due to : - - - - -   No flowsheet data found.       Assessment & Plan:   1. Postoperative hypothyroidism   2. Age-related osteoporosis without current pathological fracture   3. History of thyroid cancer   4. Memory loss   5. Arthralgia of knee, right   6. Chronic pain of right knee    History of thyroid cancer status post thyroidectomy.  Maintained on thyroid supplementation.  Overcorrection of thyroid at last visit.  Tolerating lower dose of medication.  Repeat labs today.  Age-related osteoporosis without fracture.  Tolerating Fosamax.  Recommend 3 servings of dairy daily and or calcium 1200 mg daily.  Also recommend vitamin D supplementation.  Improving right knee pain.  Status post x-rays at last visit.  Moderate osteoarthritis present and calcifications along the lateral aspect of the knee suggesting a previous avulsion  fracture.  Continue meloxicam as needed for pain.  Also recommend Tylenol as needed for knee pain.  quality of life is the focus.  Memory loss persist.  Coping well with living independently in her own home.  Sister lives very close and very supportive.  Orders Placed This Encounter  Procedures  . CBC with Differential/Platelet  . Comprehensive metabolic panel  . TSH  . T4, free  . Care order/instruction:    Scheduling Instructions:     Perform mini mental status exam   Meds ordered this encounter  Medications  . levothyroxine (SYNTHROID, LEVOTHROID) 75 MCG tablet    Sig: take 1 tablet by mouth every morning ON AN EMPTY STOMACH    Dispense:  30 tablet    Refill:  5    To replace 124mcg levothyroxine; please delete further refills of 140mcg rx.  . meloxicam (MOBIC) 7.5 MG tablet    Sig: Take one daily after breakfast for pain and inflammation of knee.    Dispense:  30 tablet    Refill:  5    Return in about 6 months (around 03/27/2018) for follow-up chronic medical conditions.   Kristi Elayne Guerin, M.D. Primary Care at [facility] previously Ur[facility] [place] [address] [place], [place]  [address] [phone] phone [phone] fax

## 2017-09-27 NOTE — Patient Instructions (Signed)
     IF you received an x-ray today, you will receive an invoice from [facility]. Please contact [facility] at [phone] with questions or concerns regarding your invoice.   IF you received labwork today, you will receive an invoice from [facility]. Please contact [facility] at [phone] with questions or concerns regarding your invoice.   Our billing staff will not be able to assist you with questions regarding bills from these companies.  You will be contacted with the lab results as soon as they are available. The fastest way to get your results is to activate your My Chart account. Instructions are located on the last page of this paperwork. If you have not heard from us regarding the results in 2 weeks, please contact this office.     

## 2017-09-28 LAB — COMPREHENSIVE METABOLIC PANEL
ALBUMIN: 4.1 g/dL (ref 3.5–4.7)
ALT: 14 IU/L (ref 0–32)
AST: 25 IU/L (ref 0–40)
Albumin/Globulin Ratio: 1.7 (ref 1.2–2.2)
Alkaline Phosphatase: 65 IU/L (ref 39–117)
BUN / CREAT RATIO: 28 (ref 12–28)
BUN: 19 mg/dL (ref 8–27)
Bilirubin Total: 0.2 mg/dL (ref 0.0–1.2)
CALCIUM: 8.8 mg/dL (ref 8.7–10.3)
CO2: 25 mmol/L (ref 20–29)
CREATININE: 0.68 mg/dL (ref 0.57–1.00)
Chloride: 103 mmol/L (ref 96–106)
GFR, EST AFRICAN AMERICAN: 90 mL/min/{1.73_m2} (ref >59)
GFR, EST NON AFRICAN AMERICAN: 78 mL/min/{1.73_m2} (ref >59)
GLOBULIN, TOTAL: 2.4 g/dL (ref 1.5–4.5)
Glucose: 105 mg/dL — ABNORMAL HIGH (ref 65–99)
Potassium: 4.6 mmol/L (ref 3.5–5.2)
SODIUM: 140 mmol/L (ref 134–144)
TOTAL PROTEIN: 6.5 g/dL (ref 6.0–8.5)

## 2017-09-28 LAB — CBC WITH DIFFERENTIAL/PLATELET
Basophils Absolute: 0 10*3/uL (ref 0.0–0.2)
Basos: 1 %
EOS (ABSOLUTE): 0.1 10*3/uL (ref 0.0–0.4)
Eos: 1 %
Hematocrit: 37.8 % (ref 34.0–46.6)
Hemoglobin: 12.3 g/dL (ref 11.1–15.9)
Immature Grans (Abs): 0 10*3/uL (ref 0.0–0.1)
Immature Granulocytes: 0 %
Lymphocytes Absolute: 2 10*3/uL (ref 0.7–3.1)
Lymphs: 32 %
MCH: 29.2 pg (ref 26.6–33.0)
MCHC: 32.5 g/dL (ref 31.5–35.7)
MCV: 90 fL (ref 79–97)
Monocytes Absolute: 0.5 10*3/uL (ref 0.1–0.9)
Monocytes: 7 %
Neutrophils Absolute: 3.8 10*3/uL (ref 1.4–7.0)
Neutrophils: 59 %
Platelets: 195 10*3/uL (ref 150–379)
RBC: 4.21 x10E6/uL (ref 3.77–5.28)
RDW: 14.3 % (ref 12.3–15.4)
WBC: 6.4 10*3/uL (ref 3.4–10.8)

## 2017-09-28 LAB — TSH: TSH: 1.21 u[IU]/mL (ref 0.450–4.500)

## 2017-09-28 LAB — T4, FREE: Free T4: 1.94 ng/dL — ABNORMAL HIGH (ref 0.82–1.77)

## 2017-09-29 ENCOUNTER — Ambulatory Visit: Payer: Medicare HMO | Admitting: Family Medicine

## 2018-03-25 ENCOUNTER — Other Ambulatory Visit: Payer: Self-pay | Admitting: Family Medicine

## 2018-03-25 NOTE — Telephone Encounter (Signed)
Synthroid 100 mcg refill request  LOV 09/27/17 with Dr. Tamala Julian    Has an appt with Dr. Tamala Julian on Monday 03/28/18 at 2:40.   Renew meds at this appt in case of changes?  Thanks.

## 2018-03-28 ENCOUNTER — Encounter: Payer: Self-pay | Admitting: Family Medicine

## 2018-03-28 ENCOUNTER — Ambulatory Visit: Payer: Medicare HMO | Admitting: Family Medicine

## 2018-03-28 ENCOUNTER — Other Ambulatory Visit: Payer: Self-pay

## 2018-03-28 VITALS — BP 132/72 | HR 92 | Temp 98.0°F | Resp 16 | Wt 93.6 lb

## 2018-03-28 DIAGNOSIS — E890 Postprocedural hypothyroidism: Secondary | ICD-10-CM | POA: Diagnosis not present

## 2018-03-28 DIAGNOSIS — M1712 Unilateral primary osteoarthritis, left knee: Secondary | ICD-10-CM

## 2018-03-28 DIAGNOSIS — R413 Other amnesia: Secondary | ICD-10-CM

## 2018-03-28 DIAGNOSIS — E7800 Pure hypercholesterolemia, unspecified: Secondary | ICD-10-CM | POA: Diagnosis not present

## 2018-03-28 DIAGNOSIS — Z0000 Encounter for general adult medical examination without abnormal findings: Secondary | ICD-10-CM | POA: Diagnosis not present

## 2018-03-28 DIAGNOSIS — M25561 Pain in right knee: Secondary | ICD-10-CM

## 2018-03-28 DIAGNOSIS — G8929 Other chronic pain: Secondary | ICD-10-CM | POA: Diagnosis not present

## 2018-03-28 DIAGNOSIS — M810 Age-related osteoporosis without current pathological fracture: Secondary | ICD-10-CM

## 2018-03-28 DIAGNOSIS — R69 Illness, unspecified: Secondary | ICD-10-CM | POA: Diagnosis not present

## 2018-03-28 DIAGNOSIS — F5101 Primary insomnia: Secondary | ICD-10-CM

## 2018-03-28 MED ORDER — LEVOTHYROXINE SODIUM 75 MCG PO TABS: ORAL_TABLET | ORAL | 5 refills | Status: DC

## 2018-03-28 MED ORDER — MIRTAZAPINE 7.5 MG PO TABS: 7.5000 mg | ORAL_TABLET | Freq: Every day | ORAL | 1 refills | Status: DC

## 2018-03-28 MED ORDER — ALENDRONATE SODIUM 70 MG PO TABS: ORAL_TABLET | ORAL | 11 refills | Status: DC

## 2018-03-28 MED ORDER — MELOXICAM 7.5 MG PO TABS: ORAL_TABLET | ORAL | 5 refills | Status: DC

## 2018-03-28 NOTE — Progress Notes (Signed)
Subjective:    Patient ID: Patricia Campbell, female    DOB: [birth year], 84 y.o.   MRN: [id number]  03/28/2018  Medicare Wellness    HPI This 84 y.o. female presents with sister for [place].   Visual Acuity Screening   Right eye Left eye Both eyes  Without correction: 20/70 20/200 20/70  With correction:       BP Readings from Last 3 Encounters:  04/15/18 (!) 145/65  03/28/18 132/72  09/27/17 140/82   Wt Readings from Last 3 Encounters:  04/15/18 93 lb (42.2 kg)  03/28/18 93 lb 9.6 oz (42.5 kg)  09/27/17 97 lb (44 kg)    There is no immunization history on file for this patient. Health Maintenance  Topic Date Due  . TETANUS/TDAP  11/[birth year]/1947  . PNA vac Low Risk Adult (1 of 2 - PCV13) 11/29/1993  . INFLUENZA VACCINE  06/09/2018  . DEXA SCAN  Completed    L knee pain:  Chronic left knee pain; hurts all the time.   Hypothyroidism: Patient reports good compliance with medication, good tolerance to medication, and good symptom control.    Osteoporosis: Patient reports good compliance with medication, good tolerance to medication, and good symptom control.    Review of Systems  Constitutional: Negative for activity change, appetite change, chills, diaphoresis, fatigue, fever and unexpected weight change.  HENT: Negative for congestion, dental problem, drooling, ear discharge, ear pain, facial swelling, hearing loss, mouth sores, nosebleeds, postnasal drip, rhinorrhea, sinus pressure, sneezing, sore throat, tinnitus, trouble swallowing and voice change.   Eyes: Negative for photophobia, pain, discharge, redness, itching and visual disturbance.  Respiratory: Negative for apnea, cough, choking, chest tightness, shortness of breath, wheezing and stridor.   Cardiovascular: Negative for chest pain, palpitations and leg swelling.  Gastrointestinal: Negative for abdominal distention, abdominal pain, anal bleeding, blood in  stool, constipation, diarrhea, nausea, rectal pain and vomiting.  Endocrine: Negative for cold intolerance, heat intolerance, polydipsia, polyphagia and polyuria.  Genitourinary: Negative for decreased urine volume, difficulty urinating, dyspareunia, dysuria, enuresis, flank pain, frequency, genital sores, hematuria, menstrual problem, pelvic pain, urgency, vaginal bleeding, vaginal discharge and vaginal pain.       Nocturia x several.  Drinks water during the night.  Musculoskeletal: Positive for arthralgias, gait problem and joint swelling. Negative for back pain, myalgias, neck pain and neck stiffness.  Skin: Negative for color change, pallor, rash and wound.  Allergic/Immunologic: Negative for environmental allergies, food allergies and immunocompromised state.  Neurological: Negative for dizziness, tremors, seizures, syncope, facial asymmetry, speech difficulty, weakness, light-headedness, numbness and headaches.  Hematological: Negative for adenopathy. Does not bruise/bleed easily.  Psychiatric/Behavioral: Negative for agitation, behavioral problems, confusion, decreased concentration, dysphoric mood, hallucinations, self-injury, sleep disturbance and suicidal ideas. The patient is not nervous/anxious and is not hyperactive.     Past Medical History:  Diagnosis Date  . [facility])    Thyroid cancer >> Thyroidectomy.  . Degenerative disc disease, lumbar   . Degenerative disc disease, thoracic   . Osteoporosis   . Scoliosis   . Thyroid disease    Past Surgical History:  Procedure Laterality Date  . TONSILLECTOMY    . TOTAL THYROIDECTOMY     Pt reports hx of thyroid cancer.   Allergies  Allergen Reactions  . Codeine    Current Outpatient Medications on File Prior to Visit  Medication Sig Dispense Refill  . Multiple Vitamin (MULTIVITAMIN) tablet Take 1 tablet by mouth daily.    Marland Kitchen  OVER THE COUNTER MEDICATION Reported on 02/04/2016    . OVER THE COUNTER MEDICATION Focus Factor  taking one daily    . OVER THE COUNTER MEDICATION Vitamin E 400 iu daily    . OVER THE COUNTER MEDICATION Reported on 02/04/2016    . OVER THE COUNTER MEDICATION Alfalfa 500 mg taking  2 a day    . OVER THE COUNTER MEDICATION Co Q 10 100 mg taking one daily    . OVER THE COUNTER MEDICATION D 3 2000 iu taking daily    . OVER THE COUNTER MEDICATION Vitamin C 500 mg prn    . OVER THE COUNTER MEDICATION Reported on 02/04/2016    . OVER THE COUNTER MEDICATION      No current facility-administered medications on file prior to visit.    Social History   Socioeconomic History  . Marital status: Widowed    Spouse name: Not on file  . Number of children: Not on file  . Years of education: Not on file  . Highest education level: Not on file  Occupational History  . Occupation: retired  Scientific laboratory technician  . Financial resource strain: Not on file  . Food insecurity:    Worry: Not on file    Inability: Not on file  . Transportation needs:    Medical: Not on file    Non-medical: Not on file  Tobacco Use  . Smoking status: Never Smoker  . Smokeless tobacco: Never Used  Substance and Sexual Activity  . Alcohol use: No  . Drug use: No  . Sexual activity: Never  Lifestyle  . Physical activity:    Days per week: Not on file    Minutes per session: Not on file  . Stress: Not on file  Relationships  . Social connections:    Talks on phone: Not on file    Gets together: Not on file    Attends religious service: Not on file    Active member of club or organization: Not on file    Attends meetings of clubs or organizations: Not on file    Relationship status: Not on file  . Intimate partner violence:    Fear of current or ex partner: Not on file    Emotionally abused: Not on file    Physically abused: Not on file    Forced sexual activity: Not on file  Other Topics Concern  . Not on file  Social History Narrative   Marital status:  Widowed x 2; second husband died in 46.  Not dating [place]  2018-02-28.      Children: 2 daughters (1 deceased bone cancer at age 74); 34 living); several grandchildren.  Daughter in [place].  Never sees daughter.      Lives: alone in a trailer; youngest sister (60 year old sister) lives close.      Tobacco; never      Alcohol: none; "I come from a [place] family"       Exercise: jumper and cardio exercise machine; sporadic use; works in the yard a lot; Arboriculturist a lot.       ADLs: ambulates with cane but uses rarely; performs most ADLs; minimal cooking; NO DRIVING; washes clothes. Goes to [employer] everyday.  Church on Sundays in 02-28-2018.       Advanced Directives:  Not sure; +CPR/DNI.    Sister:  Patricia Campbell    History reviewed. No pertinent family history.     Objective:    BP 132/72  Pulse 92   Temp 98 F (36.7 C) (Oral)   Resp 16   Wt 93 lb 9.6 oz (42.5 kg)   SpO2 97%   BMI 18.87 kg/m  Physical Exam  Constitutional: She is oriented to person, place, and time. She appears well-developed and well-nourished. No distress.  HENT:  Head: Normocephalic and atraumatic.  Right Ear: External ear normal.  Left Ear: External ear normal.  Nose: Nose normal.  Mouth/Throat: Oropharynx is clear and moist.  Eyes: Pupils are equal, round, and reactive to light. Conjunctivae and EOM are normal.  Neck: Normal range of motion and full passive range of motion without pain. Neck supple. No JVD present. Carotid bruit is not present. No thyromegaly present.  Cardiovascular: Normal rate, regular rhythm and normal heart sounds. Exam reveals no gallop and no friction rub.  No murmur heard. Pulmonary/Chest: Effort normal and breath sounds normal. She has no wheezes. She has no rales.  Abdominal: Soft. Bowel sounds are normal. She exhibits no distension and no mass. There is no tenderness. There is no rebound and no guarding.  Musculoskeletal:       Right shoulder: Normal.       Left shoulder: Normal.       Left knee: She exhibits decreased range of motion, swelling  and bony tenderness. She exhibits no effusion and no erythema. Tenderness found.       Cervical back: Normal.  Lymphadenopathy:    She has no cervical adenopathy.  Neurological: She is alert and oriented to person, place, and time. She has normal reflexes. No cranial nerve deficit. She exhibits normal muscle tone. Coordination normal.  Skin: Skin is warm and dry. No rash noted. She is not diaphoretic. No erythema. No pallor.  Psychiatric: She has a normal mood and affect. Her behavior is normal. Judgment and thought content normal.  Nursing note and vitals reviewed.  No results found. Depression screen [facility] 2/9 03/28/2018 03/28/2018 09/27/2017 03/30/2017 07/28/2016  Decreased Interest 0 0 0 0 0  Down, Depressed, Hopeless 0 0 0 0 0  PHQ - 2 Score 0 0 0 0 0   Fall Risk  03/28/2018 03/28/2018 09/27/2017 03/30/2017 07/28/2016  Falls in the past year? No No No No No  Number falls in past yr: - - - - -  Injury with Fall? - - - - -  Risk for fall due to : - - - - -    Functional Status Survey: Is the patient deaf or have difficulty hearing?: Yes Does the patient have difficulty seeing, even when wearing glasses/contacts?: No Does the patient have difficulty concentrating, remembering, or making decisions?: Yes Does the patient have difficulty walking or climbing stairs?: No Does the patient have difficulty dressing or bathing?: No Does the patient have difficulty doing errands alone such as visiting a doctor's office or shopping?: Yes MMSE - Leslie Exam 03/28/2018  Orientation to time 3  Orientation to Place 2  Registration 3  Attention/ Calculation 2  Recall 0  Language- name 2 objects 2  Language- repeat 1  Language- follow 3 step command 3  Language- read & follow direction 1  Write a sentence 1  Copy design 1  Total score 19         Assessment & Plan:   1. Encounter for Medicare annual wellness exam   2. Routine physical examination   3. Postoperative hypothyroidism     4. Age-related osteoporosis without current pathological fracture   5. Primary insomnia  6. Memory loss   7. Pure hypercholesterolemia   8. Primary osteoarthritis of left knee   9. Arthralgia of knee, right   10. Chronic pain of right knee     -anticipatory guidance provided --- exercise, weight loss, safe driving practices, calcium 600mg  twice daily. -obtain age appropriate screening labs and labs for chronic disease management. -moderate fall risk; no evidence of depression; no evidence of hearing loss.  Discussed advanced directives and living will; also discussed end of life issues including code status.  -Hypothyroidism s/p thyroidectomy due to thyroid cancer: stable; obtain labs; continue current medications. -Osteoporosis: stable; obtain labs; continue Fosamax. -Insomnia: New onset; rx for Remeron provided; should help with appetite as well. -B knee pain: daily L knee pain; refer to orthopedics for further evaluation; rx for Meloxicam provided. -Memory loss: worsening; obtain labs to rule out secondary causes.  Initiating Remeron therapy at this visit; recommend initiating Aricept at next visit.  No longer drives; no longer cooks. Good family support from sister.   Orders Placed This Encounter  Procedures  . CBC with Differential/Platelet  . Comprehensive metabolic panel    Order Specific Question:   Has the patient fasted?    Answer:   No  . Lipid panel    Order Specific Question:   Has the patient fasted?    Answer:   No  . TSH  . T4, Free  . Vitamin B12  . Ambulatory referral to Orthopedic Surgery    Referral Priority:   Routine    Referral Type:   Surgical    Referral Reason:   Specialty Services Required    Requested Specialty:   Orthopedic Surgery    Number of Visits Requested:   1  . Care order/instruction:    Scheduling Instructions:     Perform mini mental status exam   Meds ordered this encounter  Medications  . DISCONTD: levothyroxine (SYNTHROID,  LEVOTHROID) 75 MCG tablet    Sig: take 1 tablet by mouth every morning ON AN EMPTY STOMACH    Dispense:  30 tablet    Refill:  5  . meloxicam (MOBIC) 7.5 MG tablet    Sig: Take one daily after breakfast for pain and inflammation of knee.    Dispense:  30 tablet    Refill:  5  . alendronate (FOSAMAX) 70 MG tablet    Sig: take 1 tablet by mouth every week WITH FULL GLASS OF WATER ON AN EMPTY STOMACH    Dispense:  4 tablet    Refill:  11  . mirtazapine (REMERON) 7.5 MG tablet    Sig: Take 1 tablet (7.5 mg total) by mouth at bedtime.    Dispense:  90 tablet    Refill:  1    Return in about 6 months (around 09/28/2018) for follow-up chronic medical conditions [place].   Patricia Campbell Elayne Guerin, M.D. Primary Care at [facility] [address] [place], [place]  [address] [phone] phone [phone] fax

## 2018-03-28 NOTE — Patient Instructions (Addendum)
IF you received an x-ray today, you will receive an invoice from [facility]. Please contact [facility] at [phone] with questions or concerns regarding your invoice.   IF you received labwork today, you will receive an invoice from [facility]. Please contact [facility] at [phone] with questions or concerns regarding your invoice.   Our billing staff will not be able to assist you with questions regarding bills from these companies.  You will be contacted with the lab results as soon as they are available. The fastest way to get your results is to activate your My Chart account. Instructions are located on the last page of this paperwork. If you have not heard from [place] regarding the results in 2 weeks, please contact this office.      End-of-Life Care What is end-of-life care? End-of-life care is the physical, emotional, mental, and spiritual care you receive during the days, weeks, or months while you are dying. Your end-of-life care team may include:  Health care providers.  A Education officer, museum.  A spiritual adviser.  The goal of end-of-life care is to give you the highest quality of life possible at the end of your life. What are the different types of end-of-life care? There are several different kinds of care options. Palliative care This type of care does not treat or cure a disease. The goal is to manage your symptoms. These may include:  Pain.  Constipation.  Nausea.  Palliative care teams often also include support for family members and loved ones. You might need palliative care for months or years. Hospice care This is a kind of palliative care ordered and provided by your health care providers. A hospice care team can also help and support your loved ones. Comfort care This type of care is for meeting your basic needs and maintaining your overall comfort at the end of your life. This includes caring for  your:  Skin.  Breathing.  Nutrition.  Rest.  Temperature.  A plan for comfort care can also address the mental, emotional, and spiritual issues that may come up at the end of your life. Where does end-of-life care take place? End-of-life care can take place wherever you are living, as long as you get the care you need. End-of-life care can happen:  At your home.  In a nursing home.  In a hospital.  In a critical care unit.  You and your loved ones might be able to decide where end-of-life care takes place. This decision depends on:  Your comfort.  Your wishes.  The medical equipment you need.  How do I know when it is time for end-of-life care? Your health care provider might tell you that there are no more treatments left to try or that treatment can no longer control your illness. Or, you may decide that you do not want to undergo the treatments that are available. Talk to your health care provider and your loved ones about your end-of-life care options. If possible, have this conversation before you need this type of care. Discuss:  How much medical treatment you want during end-of-life care.  Where you would like to live while you are dying.  What kinds of treatments you would accept to keep you comfortable.  Which treatments you would refuse.  Your faith or spiritual needs at the end of your life.  Who will handle practical details, such as wills and finances.  You can create legal documents (advance directives) to let your loved ones  know your wishes for end-of-life care. Talk to your health care provider or a lawyer about making a living will that explains your medical wishes. You can also have a medical power of attorney. This designates a person to make health decisions for you if you cannot make them yourself. This information is not intended to replace advice given to you by your health care provider. Make sure you discuss any questions you have with your  health care provider. Document Released: 05/23/2014 Document Revised: 10/09/2016 Document Reviewed: 01/19/2014 Elsevier Interactive Patient Education  2017 [place] Directive Advance directives are legal documents that let you make choices ahead of time about your health care and medical treatment in case you become unable to communicate for yourself. Advance directives are a way for you to communicate your wishes to family, friends, and health care providers. This can help convey your decisions about end-of-life care if you become unable to communicate. Discussing and writing advance directives should happen over time rather than all at once. Advance directives can be changed depending on your situation and what you want, even after you have signed the advance directives. If you do not have an advance directive, some states assign family decision makers to act on your behalf based on how closely you are related to them. Each state has its own laws regarding advance directives. You may want to check with your health care provider, attorney, or state representative about the laws in your state. There are different types of advance directives, such as:  Medical power of attorney.  Living will.  Do not resuscitate (DNR) or do not attempt resuscitation (DNAR) order.  Health care proxy and medical power of attorney A health care proxy, also called a health care agent, is a person who is appointed to make medical decisions for you in cases in which you are unable to make the decisions yourself. Generally, people choose someone they know well and trust to represent their preferences. Make sure to ask this person for an agreement to act as your proxy. A proxy may have to exercise judgment in the event of a medical decision for which your wishes are not known. A medical power of attorney is a legal document that names your health care proxy. Depending on the laws in your state, after the  document is written, it may also need to be:  Signed.  Notarized.  Dated.  Copied.  Witnessed.  Incorporated into your medical record.  You may also want to appoint someone to manage your financial affairs in a situation in which you are unable to do so. This is called a durable power of attorney for finances. It is a separate legal document from the durable power of attorney for health care. You may choose the same person or someone different from your health care proxy to act as your agent in financial matters. If you do not appoint a proxy, or if there is a concern that the proxy is not acting in your best interests, a court-appointed guardian may be designated to act on your behalf. Living will A living will is a set of instructions documenting your wishes about medical care when you cannot express them yourself. Health care providers should keep a copy of your living will in your medical record. You may want to give a copy to family members or friends. To alert caregivers in case of an emergency, you can place a card in your wallet to let them know that you  have a living will and where they can find it. A living will is used if you become:  Terminally ill.  Incapacitated.  Unable to communicate or make decisions.  Items to consider in your living will include:  The use or non-use of life-sustaining equipment, such as dialysis machines and breathing machines (ventilators).  A DNR or DNAR order, which is the instruction not to use cardiopulmonary resuscitation (CPR) if breathing or heartbeat stops.  The use or non-use of tube feeding.  Withholding of food and fluids.  Comfort (palliative) care when the goal becomes comfort rather than a cure.  Organ and tissue donation.  A living will does not give instructions for distributing your money and property if you should pass away. It is recommended that you seek the advice of a lawyer when writing a will. Decisions about taxes,  beneficiaries, and asset distribution will be legally binding. This process can relieve your family and friends of any concerns surrounding disputes or questions that may come up about the distribution of your assets. DNR or DNAR A DNR or DNAR order is a request not to have CPR in the event that your heart stops beating or you stop breathing. If a DNR or DNAR order has not been made and shared, a health care provider will try to help any patient whose heart has stopped or who has stopped breathing. If you plan to have surgery, talk with your health care provider about how your DNR or DNAR order will be followed if problems occur. Summary  Advance directives are the legal documents that allow you to make choices ahead of time about your health care and medical treatment in case you become unable to communicate for yourself.  The process of discussing and writing advance directives should happen over time. You can change the advance directives, even after you have signed them.  Advance directives include DNR or DNAR orders, living wills, and designating an agent as your medical power of attorney. This information is not intended to replace advice given to you by your health care provider. Make sure you discuss any questions you have with your health care provider. Document Released: 02/02/2008 Document Revised: 09/14/2016 Document Reviewed: 09/14/2016 Elsevier Interactive Patient Education  2017 [employer].

## 2018-03-29 ENCOUNTER — Other Ambulatory Visit: Payer: Self-pay | Admitting: Family Medicine

## 2018-03-29 ENCOUNTER — Encounter: Payer: Self-pay | Admitting: Family Medicine

## 2018-03-29 LAB — COMPREHENSIVE METABOLIC PANEL
A/G RATIO: 2.1 (ref 1.2–2.2)
ALT: 15 IU/L (ref 0–32)
AST: 22 IU/L (ref 0–40)
Albumin: 4 g/dL (ref 3.5–4.7)
Alkaline Phosphatase: 58 IU/L (ref 39–117)
BILIRUBIN TOTAL: 0.2 mg/dL (ref 0.0–1.2)
BUN/Creatinine Ratio: 35 — ABNORMAL HIGH (ref 12–28)
BUN: 27 mg/dL (ref 8–27)
CALCIUM: 9 mg/dL (ref 8.7–10.3)
CO2: 25 mmol/L (ref 20–29)
Chloride: 104 mmol/L (ref 96–106)
Creatinine, Ser: 0.77 mg/dL (ref 0.57–1.00)
GFR, EST AFRICAN AMERICAN: 79 mL/min/{1.73_m2} (ref >59)
GFR, EST NON AFRICAN AMERICAN: 69 mL/min/{1.73_m2} (ref >59)
GLOBULIN, TOTAL: 1.9 g/dL (ref 1.5–4.5)
Glucose: 118 mg/dL — ABNORMAL HIGH (ref 65–99)
POTASSIUM: 4.5 mmol/L (ref 3.5–5.2)
SODIUM: 144 mmol/L (ref 134–144)
TOTAL PROTEIN: 5.9 g/dL — AB (ref 6.0–8.5)

## 2018-03-29 LAB — CBC WITH DIFFERENTIAL/PLATELET
BASOS: 1 %
Basophils Absolute: 0 10*3/uL (ref 0.0–0.2)
EOS (ABSOLUTE): 0.1 10*3/uL (ref 0.0–0.4)
EOS: 2 %
HEMATOCRIT: 36.2 % (ref 34.0–46.6)
Hemoglobin: 11.8 g/dL (ref 11.1–15.9)
IMMATURE GRANULOCYTES: 0 %
Immature Grans (Abs): 0 10*3/uL (ref 0.0–0.1)
Lymphocytes Absolute: 1.9 10*3/uL (ref 0.7–3.1)
Lymphs: 31 %
MCH: 29.4 pg (ref 26.6–33.0)
MCHC: 32.6 g/dL (ref 31.5–35.7)
MCV: 90 fL (ref 79–97)
MONOS ABS: 0.4 10*3/uL (ref 0.1–0.9)
Monocytes: 7 %
NEUTROS ABS: 3.7 10*3/uL (ref 1.4–7.0)
NEUTROS PCT: 59 %
Platelets: 177 10*3/uL (ref 150–450)
RBC: 4.02 x10E6/uL (ref 3.77–5.28)
RDW: 15.5 % — ABNORMAL HIGH (ref 12.3–15.4)
WBC: 6.2 10*3/uL (ref 3.4–10.8)

## 2018-03-29 LAB — LIPID PANEL
CHOL/HDL RATIO: 2.7 ratio (ref 0.0–4.4)
Cholesterol, Total: 183 mg/dL (ref 100–199)
HDL: 69 mg/dL (ref >39)
LDL Calculated: 100 mg/dL — ABNORMAL HIGH (ref 0–99)
Triglycerides: 72 mg/dL (ref 0–149)
VLDL Cholesterol Cal: 14 mg/dL (ref 5–40)

## 2018-03-29 LAB — TSH: TSH: 0.296 u[IU]/mL — AB (ref 0.450–4.500)

## 2018-03-29 LAB — VITAMIN B12

## 2018-03-29 LAB — T4, FREE: Free T4: 2.4 ng/dL — ABNORMAL HIGH (ref 0.82–1.77)

## 2018-03-29 MED ORDER — LEVOTHYROXINE SODIUM 50 MCG PO TABS: ORAL_TABLET | ORAL | 1 refills | Status: DC

## 2018-04-06 ENCOUNTER — Telehealth: Payer: Self-pay | Admitting: Family Medicine

## 2018-04-06 ENCOUNTER — Encounter: Payer: Self-pay | Admitting: Family Medicine

## 2018-04-06 NOTE — Telephone Encounter (Signed)
Pt sister [name] called to check on the dose change. She said that it was cut in half and wants to make sure that is a correct decrease. She said it usually doesn't change much, if it changes at all. Please call [phone]. Ok to leave detailed msg if she does not answer.  Copied from [place] [phone]. Topic: Quick Communication - Lab Results >> Apr 01, 2018  7:36 AM Rodolph Bong, RN wrote: Called patient to inform them of  lab results. When patient returns call, triage nurse may disclose results.  Call patient's sister: No evidence of anemia with a hemoglobin of 11.8. Liver and kidney functions are normal. Cholesterol is normal. Thyroid function or TSH is slightly overcorrected. I recommend decreasing your thyroid medication again. I will send in a new dose to your pharmacy. Vitamin B12 level is normal.

## 2018-04-07 NOTE — Telephone Encounter (Signed)
Pt sister advised of change in dose.

## 2018-04-15 ENCOUNTER — Ambulatory Visit (INDEPENDENT_AMBULATORY_CARE_PROVIDER_SITE_OTHER): Payer: Medicare HMO | Admitting: Orthopaedic Surgery

## 2018-04-15 ENCOUNTER — Encounter (INDEPENDENT_AMBULATORY_CARE_PROVIDER_SITE_OTHER): Payer: Self-pay | Admitting: Orthopaedic Surgery

## 2018-04-15 VITALS — BP 145/65 | HR 77 | Ht 59.0 in | Wt 93.0 lb

## 2018-04-15 DIAGNOSIS — M25562 Pain in left knee: Secondary | ICD-10-CM

## 2018-04-15 DIAGNOSIS — G8929 Other chronic pain: Secondary | ICD-10-CM | POA: Diagnosis not present

## 2018-04-15 MED ORDER — METHYLPREDNISOLONE ACETATE 40 MG/ML IJ SUSP
80.0000 mg | INTRAMUSCULAR | Status: AC | PRN
  Administered 2018-04-15: 80 mg

## 2018-04-15 MED ORDER — LIDOCAINE HCL 1 % IJ SOLN
2.0000 mL | INTRAMUSCULAR | Status: AC | PRN
  Administered 2018-04-15: 2 mL

## 2018-04-15 MED ORDER — BUPIVACAINE HCL 0.5 % IJ SOLN
2.0000 mL | INTRAMUSCULAR | Status: AC | PRN
  Administered 2018-04-15: 2 mL via INTRA_ARTICULAR

## 2018-04-15 NOTE — Progress Notes (Signed)
Office Visit Note   Patient: Patricia Campbell           Date of Birth: 08/29/1927           MRN: [id number] Visit Date: 04/15/2018              Requested by: Wardell Honour, MD [address] [place], [place] [address] PCP: Wardell Honour, MD   Assessment & Plan: Visit Diagnoses:  1. Chronic pain of left knee     Plan: Osteoarthritis left knee.  Cortisone injection.  Long discussion with Patricia Campbell and her sister.  Might want to try a pullover knee support.  May continue with meloxicam per her primary care physician .will see back as needed  Follow-Up Instructions: Return if symptoms worsen or fail to improve.   Orders:  Orders Placed This Encounter  Procedures  . Large Joint Inj: L knee   No orders of the defined types were placed in this encounter.     Procedures: Large Joint Inj: L knee on 04/15/2018 12:30 PM Indications: pain and diagnostic evaluation Details: 25 G 1.5 in needle, anteromedial approach  Arthrogram: No  Medications: 2 mL lidocaine 1 %; 2 mL bupivacaine 0.5 %; 80 mg methylPREDNISolone acetate 40 MG/ML Procedure, treatment alternatives, risks and benefits explained, specific risks discussed. Consent was given by the patient. Patient was prepped and draped in the usual sterile fashion.       Clinical Data: No additional findings.   Subjective: Chief Complaint  Patient presents with  . Left Knee - Pain  . New Patient (Initial Visit)    L KNEE PAIN FOR A YR GETTING WORSE, NO INJURY, NO SURGERY, HAD INJECTION 3 YRS AGO DR HOPPER  84 year old female accompanied by her sister and here for evaluation of left knee pain.  Has had some problem off and on for many years.  At one time had a cortisone injection with relief.  Had recent exacerbation without injury.  No particular swelling.  Pain mostly along the medial lateral compartments.  Has been using meloxicam with some relief. I did review the films of her left knee on the PACS system from her primary care  physician's office.  There are degenerative changes.  Also evidence of diffuse osteopenia.  HPI  Review of Systems  Constitutional: Positive for fatigue. Negative for fever.  HENT: Negative for ear pain.   Eyes: Positive for pain.  Respiratory: Negative for cough and shortness of breath.   Cardiovascular: Positive for leg swelling.  Gastrointestinal: Positive for constipation and diarrhea.  Genitourinary: Negative for difficulty urinating.  Musculoskeletal: Positive for back pain. Negative for neck pain.  Skin: Negative for rash.  Allergic/Immunologic: Negative for food allergies.  Neurological: Positive for weakness and numbness.  Hematological: Does not bruise/bleed easily.  Psychiatric/Behavioral: Positive for sleep disturbance.     Objective: Vital Signs: BP (!) 145/65 (BP Location: Left Arm, Patient Position: Sitting, Cuff Size: Normal)   Pulse 77   Ht 4\' 11"  (1.499 m)   Wt 93 lb (42.2 kg)   BMI 18.78 kg/m   Physical Exam  Constitutional: She is oriented to person, place, and time. She appears well-developed and well-nourished.  HENT:  Mouth/Throat: Oropharynx is clear and moist.  Eyes: Pupils are equal, round, and reactive to light. EOM are normal.  Pulmonary/Chest: Effort normal.  Neurological: She is alert and oriented to person, place, and time.  Skin: Skin is warm and dry.  Psychiatric: She has a normal mood and affect.  Her behavior is normal.    Ortho Exam awake alert and oriented x3.  Hard of hearing.  Examination the left knee without effusion.  Slight increased valgus with weightbearing.  No instability.  No calf pain or popliteal discomfort.  Mild medial and lateral joint pain.  No patellar crepitation or pain with motion.  Straight leg raise negative.  Painless range of motion of left hip Specialty Comments:  No specialty comments available.  Imaging: No results found.   PMFS History: Patient Active Problem List   Diagnosis Date Noted  . Memory loss  08/29/2016  . Insomnia 07/23/2015  . Degenerative disc disease, lumbar 07/23/2015  . History of thyroid cancer 07/23/2015  . Lumbago 06/27/2015  . Idiopathic scoliosis 03/03/2013  . Schatzki's ring 12/12/2012  . Hypothyroidism 09/16/2012  . Osteoporosis 09/16/2012   Past Medical History:  Diagnosis Date  . Cancer [facility])    Thyroid cancer >> Thyroidectomy.  . Degenerative disc disease, lumbar   . Degenerative disc disease, thoracic   . Osteoporosis   . Scoliosis   . Thyroid disease     History reviewed. No pertinent family history.  Past Surgical History:  Procedure Laterality Date  . TONSILLECTOMY    . TOTAL THYROIDECTOMY     Pt reports hx of thyroid cancer.   Social History   Occupational History  . Occupation: retired  Tobacco Use  . Smoking status: Never Smoker  . Smokeless tobacco: Never Used  Substance and Sexual Activity  . Alcohol use: No  . Drug use: No  . Sexual activity: Never      TR

## 2018-04-20 ENCOUNTER — Other Ambulatory Visit: Payer: Self-pay | Admitting: Family Medicine

## 2018-10-07 ENCOUNTER — Other Ambulatory Visit: Payer: Self-pay

## 2018-10-07 ENCOUNTER — Ambulatory Visit: Payer: Medicare HMO | Admitting: Emergency Medicine

## 2018-10-07 ENCOUNTER — Other Ambulatory Visit: Payer: Self-pay | Admitting: Family Medicine

## 2018-10-07 ENCOUNTER — Ambulatory Visit: Payer: Medicare HMO | Admitting: Family Medicine

## 2018-10-07 MED ORDER — LEVOTHYROXINE SODIUM 50 MCG PO TABS: ORAL_TABLET | ORAL | 0 refills | Status: DC

## 2018-10-07 NOTE — Telephone Encounter (Signed)
Pt requesting a 90 day refill. 30 day refill sent on 10/07/18.

## 2018-10-18 ENCOUNTER — Ambulatory Visit: Payer: Medicare HMO | Admitting: Emergency Medicine

## 2018-11-11 ENCOUNTER — Other Ambulatory Visit: Payer: Self-pay | Admitting: Family Medicine

## 2018-11-11 ENCOUNTER — Ambulatory Visit (INDEPENDENT_AMBULATORY_CARE_PROVIDER_SITE_OTHER): Payer: Medicare HMO | Admitting: Family Medicine

## 2018-11-11 ENCOUNTER — Other Ambulatory Visit: Payer: Self-pay

## 2018-11-11 ENCOUNTER — Encounter: Payer: Self-pay | Admitting: Family Medicine

## 2018-11-11 VITALS — BP 132/67 | HR 94 | Temp 97.5°F | Ht <= 58 in | Wt 94.0 lb

## 2018-11-11 DIAGNOSIS — M25562 Pain in left knee: Secondary | ICD-10-CM

## 2018-11-11 DIAGNOSIS — E039 Hypothyroidism, unspecified: Secondary | ICD-10-CM | POA: Diagnosis not present

## 2018-11-11 DIAGNOSIS — M810 Age-related osteoporosis without current pathological fracture: Secondary | ICD-10-CM

## 2018-11-11 DIAGNOSIS — Z23 Encounter for immunization: Secondary | ICD-10-CM

## 2018-11-11 DIAGNOSIS — E7800 Pure hypercholesterolemia, unspecified: Secondary | ICD-10-CM

## 2018-11-11 DIAGNOSIS — G8929 Other chronic pain: Secondary | ICD-10-CM

## 2018-11-11 MED ORDER — LEVOTHYROXINE SODIUM 50 MCG PO TABS: ORAL_TABLET | ORAL | 0 refills | Status: DC

## 2018-11-11 MED ORDER — MIRTAZAPINE 7.5 MG PO TABS: 7.5000 mg | ORAL_TABLET | Freq: Every day | ORAL | 1 refills | Status: DC

## 2018-11-11 MED ORDER — MELOXICAM 7.5 MG PO TABS: ORAL_TABLET | ORAL | 2 refills | Status: DC

## 2018-11-11 NOTE — Patient Instructions (Signed)
Take mobic for when knee pain is severe, for mild to moderate ok to take tylenol 650mg 

## 2018-11-11 NOTE — Telephone Encounter (Signed)
Pt has office visit today at 1:40 with Dr. Pamella Pert. Review for refill of levothyroxine 50 mcg.

## 2018-11-11 NOTE — Progress Notes (Signed)
1/3/20202:13 PM  Patricia Campbell 1927-03-13, 85 y.o. female [id number]  Chief Complaint  Patient presents with  . Follow-up    medication refill synthroid, mobic, fosamax. Sister says she is not eating as she should    HPI:   Patient is a 85 y.o. female with past medical history significant for post operative hypothryoidism, osteoporosis, OA who presents today for routine followup  Previous patient of Dr Tamala Julian Last OV may 2019 Here with her sister, who provides most of her history  Hypothyroidism 2/2 thyroidectomy for thyroid cancer Does not see endo anymore Lab Results  Component Value Date   TSH 0.296 (L) 03/28/2018   Osteoporosis, but denies any h/o fracture Last dexa in 2013 Per notes in dec 2013 she was already on fosamax She does take calcium and vitamin D No recent falls  OA of left knee, mobic helps  Patient lives alone, manages her own meds, does her cooking and light cleaning, niece helps with heavy cleaning, despite having some memory problems Sister manages finances and transportation  Fall Risk  03/28/2018 03/28/2018 09/27/2017 03/30/2017 07/28/2016  Falls in the past year? No No No No No  Number falls in past yr: - - - - -  Injury with Fall? - - - - -  Risk for fall due to : - - - - -     Depression screen [facility] 2/9 03/28/2018 03/28/2018 09/27/2017  Decreased Interest 0 0 0  Down, Depressed, Hopeless 0 0 0  PHQ - 2 Score 0 0 0    Allergies  Allergen Reactions  . Codeine     Prior to Admission medications   Medication Sig Start Date End Date Taking? Authorizing Provider  alendronate (FOSAMAX) 70 MG tablet take 1 tablet by mouth every week WITH FULL GLASS OF WATER ON AN EMPTY STOMACH 03/28/18  Yes Wardell Honour, MD  levothyroxine (SYNTHROID, LEVOTHROID) 50 MCG tablet take 1 tablet by mouth every morning ON AN EMPTY STOMACH 10/07/18  Yes Stallings, Zoe A, MD  meloxicam (MOBIC) 7.5 MG tablet Take one daily after breakfast for pain and inflammation of  knee. 03/28/18  Yes Wardell Honour, MD  mirtazapine (REMERON) 7.5 MG tablet Take 1 tablet (7.5 mg total) by mouth at bedtime. 03/28/18  Yes Wardell Honour, MD  Multiple Vitamin (MULTIVITAMIN) tablet Take 1 tablet by mouth daily.   Yes [provider]  OVER THE COUNTER MEDICATION Reported on 02/04/2016   Yes [provider]  OVER THE COUNTER MEDICATION Focus Factor taking one daily   Yes [provider]  OVER THE COUNTER MEDICATION Vitamin E 400 iu daily   Yes [provider]  OVER THE COUNTER MEDICATION Reported on 02/04/2016   Yes [provider]  OVER THE COUNTER MEDICATION Alfalfa 500 mg taking  2 a day   Yes [provider]  OVER THE COUNTER MEDICATION Co Q 10 100 mg taking one daily   Yes [provider]  OVER THE COUNTER MEDICATION D 3 2000 iu taking daily   Yes [provider]  OVER THE COUNTER MEDICATION Vitamin C 500 mg prn   Yes [provider]  OVER THE COUNTER MEDICATION Reported on 02/04/2016   Yes [provider]  [place]    Yes [provider]    Past Medical History:  Diagnosis Date  . Cancer [facility])    Thyroid cancer >> Thyroidectomy.  . Degenerative disc disease, lumbar   . Degenerative disc  disease, thoracic   . Osteoporosis   . Scoliosis   . Thyroid disease     Past Surgical History:  Procedure Laterality Date  . TONSILLECTOMY    . TOTAL THYROIDECTOMY     Pt reports hx of thyroid cancer.    Social History   Tobacco Use  . Smoking status: Never Smoker  . Smokeless tobacco: Never Used  Substance Use Topics  . Alcohol use: No    History reviewed. No pertinent family history.  Review of Systems  Constitutional: Negative for chills and fever.  Respiratory: Negative for cough and shortness of breath.   Cardiovascular: Negative for chest pain, palpitations and leg swelling.  Gastrointestinal: Negative for abdominal pain, nausea and vomiting.    Psychiatric/Behavioral: The patient has insomnia (has not had remeron of recent).      OBJECTIVE:  Blood pressure 132/67, pulse 94, temperature (!) 97.5 F (36.4 C), temperature source Oral, height '4\' 10"'  (1.473 m), weight 94 lb (42.6 kg), SpO2 94 %. Body mass index is 19.65 kg/m.   Physical Exam Vitals signs and nursing note reviewed.  Constitutional:      Appearance: She is well-developed.  HENT:     Head: Normocephalic and atraumatic.     Mouth/Throat:     Pharynx: No oropharyngeal exudate.  Eyes:     General: No scleral icterus.    Conjunctiva/sclera: Conjunctivae normal.     Pupils: Pupils are equal, round, and reactive to light.  Neck:     Musculoskeletal: Neck supple.  Cardiovascular:     Rate and Rhythm: Normal rate and regular rhythm.     Heart sounds: Normal heart sounds. No murmur. No friction rub. No gallop.   Pulmonary:     Effort: Pulmonary effort is normal.     Breath sounds: Normal breath sounds. No wheezing or rales.  Skin:    General: Skin is warm and dry.  Neurological:     Mental Status: She is alert and oriented to person, place, and time.     ASSESSMENT and PLAN  1. Hypothyroidism, unspecified type Checking labs today, medications will be adjusted as needed.  - TSH  2. Age-related osteoporosis without current pathological fracture D/C alendronate > 5 years of treatment Cont with ca/vit D Fall precautions  3. Pure hypercholesterolemia - Lipid panel - CMP14+EGFR  4. Chronic pain of left knee Discussed risk of daily meds, use only for severe pain, can use tylenol prn for mild to mod pain - meloxicam (MOBIC) 7.5 MG tablet; Take one daily after breakfast for pain and inflammation of knee.  Other orders - levothyroxine (SYNTHROID, LEVOTHROID) 50 MCG tablet; take 1 tablet by mouth every morning ON AN EMPTY STOMACH - mirtazapine (REMERON) 7.5 MG tablet; Take 1 tablet (7.5 mg total) by mouth at bedtime. - Flu vaccine HIGH DOSE PF (Fluzone  High dose) - Pneumococcal conjugate vaccine 13-valent IM    Return in about 6 months (around 05/12/2019) for medicare wellness.    Rutherford Guys, MD [facility] [place], [place] [address] Ph.  [phone] Fax [phone]

## 2018-11-12 LAB — TSH: TSH: 56.71 u[IU]/mL — ABNORMAL HIGH (ref 0.450–4.500)

## 2018-11-12 LAB — CMP14+EGFR
ALT: 13 IU/L (ref 0–32)
AST: 23 IU/L (ref 0–40)
Albumin/Globulin Ratio: 1.8 (ref 1.2–2.2)
Albumin: 3.9 g/dL (ref 3.5–4.7)
Alkaline Phosphatase: 60 IU/L (ref 39–117)
BUN/Creatinine Ratio: 21 (ref 12–28)
BUN: 18 mg/dL (ref 8–27)
Bilirubin Total: 0.4 mg/dL (ref 0.0–1.2)
CO2: 24 mmol/L (ref 20–29)
Calcium: 8.7 mg/dL (ref 8.7–10.3)
Chloride: 101 mmol/L (ref 96–106)
Creatinine, Ser: 0.86 mg/dL (ref 0.57–1.00)
GFR calc Af Amer: 69 mL/min/{1.73_m2} (ref >59)
GFR calc non Af Amer: 60 mL/min/{1.73_m2} (ref >59)
Globulin, Total: 2.2 g/dL (ref 1.5–4.5)
Glucose: 126 mg/dL — ABNORMAL HIGH (ref 65–99)
Potassium: 4.1 mmol/L (ref 3.5–5.2)
Sodium: 139 mmol/L (ref 134–144)
Total Protein: 6.1 g/dL (ref 6.0–8.5)

## 2018-11-12 LAB — LIPID PANEL
Chol/HDL Ratio: 2.6 ratio (ref 0.0–4.4)
Cholesterol, Total: 188 mg/dL (ref 100–199)
HDL: 73 mg/dL (ref >39)
LDL Calculated: 104 mg/dL — ABNORMAL HIGH (ref 0–99)
Triglycerides: 56 mg/dL (ref 0–149)
VLDL Cholesterol Cal: 11 mg/dL (ref 5–40)

## 2018-11-16 ENCOUNTER — Telehealth: Payer: Self-pay | Admitting: Family Medicine

## 2018-11-16 DIAGNOSIS — E039 Hypothyroidism, unspecified: Secondary | ICD-10-CM

## 2018-11-16 NOTE — Telephone Encounter (Signed)
Copied from [place] [id number]. Topic: General - Other >> Nov 16, 2018 10:50 AM Carolyn Stare wrote:  Pt sister returned Dr Pamella Pert called and said she may leave any questions or concerns on answering machine

## 2018-11-17 NOTE — Telephone Encounter (Signed)
Please call patient's pharmacy and investigate if levothyroxine/synthroid has been being filled on time. thanks

## 2018-11-17 NOTE — Telephone Encounter (Signed)
Per pharmacy pt rx for Levothyroxine/Synthroid 50 mg #30 was just picked up.

## 2018-11-17 NOTE — Telephone Encounter (Signed)
Per pharmacy looks like pt is more on time with medication than not.

## 2018-11-18 MED ORDER — LEVOTHYROXINE SODIUM 75 MCG PO TABS: ORAL_TABLET | ORAL | 1 refills | Status: DC

## 2018-11-18 NOTE — Addendum Note (Signed)
Addended by: Rutherford Guys on: 11/18/2018 02:36 PM   Modules accepted: Orders

## 2018-11-18 NOTE — Telephone Encounter (Signed)
Please call patient's sister, ok to leave VM, and let her know that I am increasing the dose of thyroid of medication. Lets recheck tsh in 6-8 weeks. thanks

## 2018-11-22 NOTE — Telephone Encounter (Signed)
Informed pt sister of medication change, she verbalized understanding.

## 2018-12-14 ENCOUNTER — Other Ambulatory Visit: Payer: Self-pay | Admitting: Family Medicine

## 2019-02-12 ENCOUNTER — Other Ambulatory Visit: Payer: Self-pay | Admitting: Family Medicine

## 2019-02-12 DIAGNOSIS — M25562 Pain in left knee: Principal | ICD-10-CM

## 2019-02-12 DIAGNOSIS — G8929 Other chronic pain: Secondary | ICD-10-CM

## 2019-04-13 ENCOUNTER — Other Ambulatory Visit: Payer: Self-pay | Admitting: Family Medicine

## 2019-04-28 ENCOUNTER — Encounter (HOSPITAL_COMMUNITY): Payer: Self-pay | Admitting: Emergency Medicine

## 2019-04-28 ENCOUNTER — Other Ambulatory Visit: Payer: Self-pay

## 2019-04-28 ENCOUNTER — Emergency Department (HOSPITAL_COMMUNITY): Payer: Medicare HMO

## 2019-04-28 ENCOUNTER — Inpatient Hospital Stay (HOSPITAL_COMMUNITY)
Admission: EM | Admit: 2019-04-28 | Discharge: 2019-05-11 | DRG: 689 | Disposition: A | Discharge status: Skilled Nursing Facility | Payer: Medicare HMO | Attending: Internal Medicine | Admitting: Internal Medicine

## 2019-04-28 ENCOUNTER — Observation Stay (HOSPITAL_COMMUNITY): Payer: Medicare HMO

## 2019-04-28 DIAGNOSIS — B9620 Unspecified Escherichia coli [E. coli] as the cause of diseases classified elsewhere: Secondary | ICD-10-CM | POA: Diagnosis present

## 2019-04-28 DIAGNOSIS — Z885 Allergy status to narcotic agent status: Secondary | ICD-10-CM

## 2019-04-28 DIAGNOSIS — R404 Transient alteration of awareness: Secondary | ICD-10-CM | POA: Diagnosis not present

## 2019-04-28 DIAGNOSIS — R109 Unspecified abdominal pain: Secondary | ICD-10-CM | POA: Diagnosis not present

## 2019-04-28 DIAGNOSIS — Z20828 Contact with and (suspected) exposure to other viral communicable diseases: Secondary | ICD-10-CM | POA: Diagnosis present

## 2019-04-28 DIAGNOSIS — R1030 Lower abdominal pain, unspecified: Secondary | ICD-10-CM | POA: Diagnosis not present

## 2019-04-28 DIAGNOSIS — Z03818 Encounter for observation for suspected exposure to other biological agents ruled out: Secondary | ICD-10-CM | POA: Diagnosis not present

## 2019-04-28 DIAGNOSIS — R55 Syncope and collapse: Secondary | ICD-10-CM

## 2019-04-28 DIAGNOSIS — R945 Abnormal results of liver function studies: Secondary | ICD-10-CM | POA: Diagnosis not present

## 2019-04-28 DIAGNOSIS — M25462 Effusion, left knee: Secondary | ICD-10-CM | POA: Diagnosis not present

## 2019-04-28 DIAGNOSIS — Z79890 Hormone replacement therapy (postmenopausal): Secondary | ICD-10-CM

## 2019-04-28 DIAGNOSIS — N399 Disorder of urinary system, unspecified: Secondary | ICD-10-CM

## 2019-04-28 DIAGNOSIS — E890 Postprocedural hypothyroidism: Secondary | ICD-10-CM | POA: Diagnosis present

## 2019-04-28 DIAGNOSIS — R319 Hematuria, unspecified: Secondary | ICD-10-CM | POA: Diagnosis not present

## 2019-04-28 DIAGNOSIS — I248 Other forms of acute ischemic heart disease: Secondary | ICD-10-CM | POA: Diagnosis present

## 2019-04-28 DIAGNOSIS — R7989 Other specified abnormal findings of blood chemistry: Secondary | ICD-10-CM | POA: Diagnosis present

## 2019-04-28 DIAGNOSIS — G9341 Metabolic encephalopathy: Secondary | ICD-10-CM | POA: Diagnosis present

## 2019-04-28 DIAGNOSIS — S0990XA Unspecified injury of head, initial encounter: Secondary | ICD-10-CM | POA: Diagnosis not present

## 2019-04-28 DIAGNOSIS — Z79899 Other long term (current) drug therapy: Secondary | ICD-10-CM

## 2019-04-28 DIAGNOSIS — Z1159 Encounter for screening for other viral diseases: Secondary | ICD-10-CM

## 2019-04-28 DIAGNOSIS — F0390 Unspecified dementia without behavioral disturbance: Secondary | ICD-10-CM | POA: Diagnosis present

## 2019-04-28 DIAGNOSIS — N390 Urinary tract infection, site not specified: Secondary | ICD-10-CM | POA: Diagnosis not present

## 2019-04-28 DIAGNOSIS — L89151 Pressure ulcer of sacral region, stage 1: Secondary | ICD-10-CM | POA: Diagnosis present

## 2019-04-28 DIAGNOSIS — R52 Pain, unspecified: Secondary | ICD-10-CM | POA: Diagnosis not present

## 2019-04-28 DIAGNOSIS — I160 Hypertensive urgency: Secondary | ICD-10-CM | POA: Diagnosis present

## 2019-04-28 DIAGNOSIS — M810 Age-related osteoporosis without current pathological fracture: Secondary | ICD-10-CM | POA: Diagnosis present

## 2019-04-28 DIAGNOSIS — Z66 Do not resuscitate: Secondary | ICD-10-CM | POA: Diagnosis present

## 2019-04-28 DIAGNOSIS — S299XXA Unspecified injury of thorax, initial encounter: Secondary | ICD-10-CM | POA: Diagnosis not present

## 2019-04-28 DIAGNOSIS — Z681 Body mass index (BMI) 19 or less, adult: Secondary | ICD-10-CM

## 2019-04-28 DIAGNOSIS — R0902 Hypoxemia: Secondary | ICD-10-CM | POA: Diagnosis not present

## 2019-04-28 DIAGNOSIS — G9340 Encephalopathy, unspecified: Secondary | ICD-10-CM

## 2019-04-28 DIAGNOSIS — Z85850 Personal history of malignant neoplasm of thyroid: Secondary | ICD-10-CM

## 2019-04-28 DIAGNOSIS — W19XXXA Unspecified fall, initial encounter: Secondary | ICD-10-CM | POA: Diagnosis present

## 2019-04-28 DIAGNOSIS — S199XXA Unspecified injury of neck, initial encounter: Secondary | ICD-10-CM | POA: Diagnosis not present

## 2019-04-28 DIAGNOSIS — R740 Nonspecific elevation of levels of transaminase and lactic acid dehydrogenase [LDH]: Secondary | ICD-10-CM | POA: Diagnosis present

## 2019-04-28 DIAGNOSIS — E43 Unspecified severe protein-calorie malnutrition: Secondary | ICD-10-CM | POA: Insufficient documentation

## 2019-04-28 DIAGNOSIS — F05 Delirium due to known physiological condition: Secondary | ICD-10-CM | POA: Diagnosis not present

## 2019-04-28 DIAGNOSIS — L8990 Pressure ulcer of unspecified site, unspecified stage: Secondary | ICD-10-CM | POA: Insufficient documentation

## 2019-04-28 DIAGNOSIS — R4020 Unspecified coma: Secondary | ICD-10-CM | POA: Diagnosis not present

## 2019-04-28 LAB — CBC WITH DIFFERENTIAL/PLATELET
Abs Immature Granulocytes: 0.04 10*3/uL (ref 0.00–0.07)
Basophils Absolute: 0 10*3/uL (ref 0.0–0.1)
Basophils Relative: 0 %
Eosinophils Absolute: 0 10*3/uL (ref 0.0–0.5)
Eosinophils Relative: 0 %
HCT: 41 % (ref 36.0–46.0)
Hemoglobin: 12.7 g/dL (ref 12.0–15.0)
Immature Granulocytes: 0 %
Lymphocytes Relative: 13 %
Lymphs Abs: 1.3 10*3/uL (ref 0.7–4.0)
MCH: 29.1 pg (ref 26.0–34.0)
MCHC: 31 g/dL (ref 30.0–36.0)
MCV: 94 fL (ref 80.0–100.0)
Monocytes Absolute: 0.7 10*3/uL (ref 0.1–1.0)
Monocytes Relative: 7 %
Neutro Abs: 8.1 10*3/uL — ABNORMAL HIGH (ref 1.7–7.7)
Neutrophils Relative %: 80 %
Platelets: 188 10*3/uL (ref 150–400)
RBC: 4.36 MIL/uL (ref 3.87–5.11)
RDW: 14.1 % (ref 11.5–15.5)
WBC: 10.1 10*3/uL (ref 4.0–10.5)
nRBC: 0 % (ref 0.0–0.2)

## 2019-04-28 LAB — BASIC METABOLIC PANEL
Anion gap: 14 (ref 5–15)
BUN: 18 mg/dL (ref 8–23)
CO2: 21 mmol/L — ABNORMAL LOW (ref 22–32)
Calcium: 8 mg/dL — ABNORMAL LOW (ref 8.9–10.3)
Chloride: 105 mmol/L (ref 98–111)
Creatinine, Ser: 1.03 mg/dL — ABNORMAL HIGH (ref 0.44–1.00)
GFR calc Af Amer: 55 mL/min — ABNORMAL LOW (ref >60)
GFR calc non Af Amer: 48 mL/min — ABNORMAL LOW (ref >60)
Glucose, Bld: 89 mg/dL (ref 70–99)
Potassium: 3.7 mmol/L (ref 3.5–5.1)
Sodium: 140 mmol/L (ref 135–145)

## 2019-04-28 LAB — MAGNESIUM: Magnesium: 2.1 mg/dL (ref 1.7–2.4)

## 2019-04-28 LAB — HEPATIC FUNCTION PANEL
ALT: 24 U/L (ref 0–44)
AST: 55 U/L — ABNORMAL HIGH (ref 15–41)
Albumin: 3.5 g/dL (ref 3.5–5.0)
Alkaline Phosphatase: 58 U/L (ref 38–126)
Bilirubin, Direct: 0.2 mg/dL (ref 0.0–0.2)
Indirect Bilirubin: 1.1 mg/dL — ABNORMAL HIGH (ref 0.3–0.9)
Total Bilirubin: 1.3 mg/dL — ABNORMAL HIGH (ref 0.3–1.2)
Total Protein: 6.1 g/dL — ABNORMAL LOW (ref 6.5–8.1)

## 2019-04-28 LAB — URINALYSIS, ROUTINE W REFLEX MICROSCOPIC
Bilirubin Urine: NEGATIVE
Glucose, UA: NEGATIVE mg/dL
Ketones, ur: 20 mg/dL — AB
Nitrite: NEGATIVE
Protein, ur: 30 mg/dL — AB
Specific Gravity, Urine: 1.011 (ref 1.005–1.030)
WBC, UA: >50 WBC/hpf — ABNORMAL HIGH (ref 0–5)
pH: 6 (ref 5.0–8.0)

## 2019-04-28 LAB — TROPONIN I
Troponin I: 0.05 ng/mL (ref <0.03)
Troponin I: 0.29 ng/mL (ref <0.03)

## 2019-04-28 LAB — AMMONIA: Ammonia: 12 umol/L (ref 9–35)

## 2019-04-28 LAB — CBG MONITORING, ED: Glucose-Capillary: 82 mg/dL (ref 70–99)

## 2019-04-28 LAB — SALICYLATE LEVEL: Salicylate Lvl: <7 mg/dL (ref 2.8–30.0)

## 2019-04-28 LAB — ACETAMINOPHEN LEVEL: Acetaminophen (Tylenol), Serum: <10 ug/mL — ABNORMAL LOW (ref 10–30)

## 2019-04-28 LAB — LIPASE, BLOOD: Lipase: 20 U/L (ref 11–51)

## 2019-04-28 LAB — LACTIC ACID, PLASMA: Lactic Acid, Venous: 2.1 mmol/L (ref 0.5–1.9)

## 2019-04-28 LAB — T4, FREE: Free T4: 1.37 ng/dL — ABNORMAL HIGH (ref 0.61–1.12)

## 2019-04-28 MED ORDER — ENOXAPARIN SODIUM 30 MG/0.3ML ~~LOC~~ SOLN
30.0000 mg | SUBCUTANEOUS | Status: DC
  Administered 2019-04-28 – 2019-05-10 (×13): 30 mg via SUBCUTANEOUS
  Filled 2019-04-28 (×13): qty 0.3

## 2019-04-28 MED ORDER — SODIUM CHLORIDE 0.9 % IV SOLN
1.0000 g | Freq: Once | INTRAVENOUS | Status: AC
  Administered 2019-04-28: 1 g via INTRAVENOUS
  Filled 2019-04-28: qty 10

## 2019-04-28 MED ORDER — SODIUM CHLORIDE 0.9 % IV SOLN
1.0000 g | INTRAVENOUS | Status: DC
  Administered 2019-04-29 – 2019-04-30 (×2): 1 g via INTRAVENOUS
  Filled 2019-04-28 (×2): qty 10

## 2019-04-28 MED ORDER — LEVOTHYROXINE SODIUM 75 MCG PO TABS
75.0000 ug | ORAL_TABLET | Freq: Every day | ORAL | Status: DC
  Administered 2019-04-29 – 2019-05-11 (×13): 75 ug via ORAL
  Filled 2019-04-28 (×13): qty 1

## 2019-04-28 MED ORDER — HYDRALAZINE HCL 20 MG/ML IJ SOLN
5.0000 mg | INTRAMUSCULAR | Status: DC | PRN
  Filled 2019-04-28: qty 1

## 2019-04-28 MED ORDER — ACETAMINOPHEN 650 MG RE SUPP: 650.0000 mg | Freq: Four times a day (QID) | RECTAL | Status: DC | PRN

## 2019-04-28 MED ORDER — ACETAMINOPHEN 325 MG PO TABS
650.0000 mg | ORAL_TABLET | Freq: Four times a day (QID) | ORAL | Status: DC | PRN
  Administered 2019-05-09 – 2019-05-11 (×2): 650 mg via ORAL
  Filled 2019-04-28 (×2): qty 2

## 2019-04-28 NOTE — H&P (Signed)
History and Physical    Patricia Campbell YHC:[id number] DOB: 10/29/1927 DOA: 04/28/2019  PCP: Rutherford Guys, MD Patient coming from: Home  Chief Complaint: Altered mental status, syncope  HPI: Patricia Campbell is a 85 y.o. female with medical history significant of thyroid cancer status post thyroidectomy, degenerative disc disease, osteoporosis presenting to the hospital via EMS for evaluation of altered mental status and syncope.  Per his EMS report, patient lives alone at home and had a fall witnessed by family.  No loss of consciousness.  Upon arrival of the fire department, patient had a syncopal episode.  Hypotensive per EMS and a 500 cc bolus was given. Patient is confused and oriented to self only.  No history could be obtained from her.  No family available at this time.  ED Course: Afebrile.  Tachycardic.  Blood pressure elevated with systolic ranging from 517O to 210s.  White count 10.1.  Lactic acid 2.1.  Hemoglobin 12.7.  BUN 18.  Creatinine 1.0, was 0.8 in January 2020.  AST mildly elevated at 55, T bili mildly elevated at 1.3.  ALT and alk phos normal.  Lipase normal.  Ammonia level normal.  Acetaminophen and salicylate levels negative.  Troponin 0.05.  UA with large amount of leukocytes, greater than 50 WBCs, and many bacteria.  Urine culture pending.  Free T4 mildly elevated at 1.37, improved compared to previous labs from a year ago.  TSH pending.  COVID-19 test pending.  Head CT negative for acute finding.  CT C-spine negative for acute finding.  Chest x-ray showing no active disease, stable scarring of the apices.  X-ray of left knee showing arthritis of the knee without acute osseous abnormality, small knee effusion.  Review of Systems:  All systems reviewed and apart from history of presenting illness, are negative.  Past Medical History:  Diagnosis Date   Cancer [facility])    Thyroid cancer >> Thyroidectomy.   Degenerative disc disease, lumbar    Degenerative disc disease,  thoracic    Osteoporosis    Scoliosis    Thyroid disease     Past Surgical History:  Procedure Laterality Date   TONSILLECTOMY     TOTAL THYROIDECTOMY     Pt reports hx of thyroid cancer.     reports that she has never smoked. She has never used smokeless tobacco. She reports that she does not drink alcohol or use drugs.  Allergies  Allergen Reactions   Codeine Other (See Comments)    History reviewed. No pertinent family history.  Prior to Admission medications   Medication Sig Start Date End Date Taking? Authorizing Provider  Alfalfa 500 MG TABS Take 1,000 mg by mouth daily.   Yes [provider]  CALCIUM PO Take 1 tablet by mouth daily.   Yes [provider]  Cholecalciferol (VITAMIN D3) 50 MCG (2000 UT) TABS Take 2,000 Units by mouth daily.   Yes [provider]  Coenzyme Q10 (CO Q-10) 100 MG CAPS Take 100 mg by mouth daily.   Yes [provider]  levothyroxine (SYNTHROID) 75 MCG tablet TAKE 1 TABLET BY MOUTH EVERY MORNING ON AN EMPTY STOMACH Patient taking differently: Take 75 mcg by mouth daily before breakfast. TAKE ON AN EMPTY STOMACH 04/13/19  Yes Rutherford Guys, MD  MAGNESIUM PO Take 1 tablet by mouth daily.   Yes [provider]  meloxicam (MOBIC) 7.5 MG tablet TAKE 1 TABLET BY MOUTH KNEE DAILY AFTER BREAKFAST FOR PAIN AND INFLAMMATION Patient taking differently: Take  7.5 mg by mouth daily as needed (for knee pain and/or inflammation- take after breakfast).  02/13/19  Yes Rutherford Guys, MD  Multiple Vitamins-Minerals (OCUVITE PO) Take 1-2 tablets by mouth daily.    Yes [provider]  Multiple Vitamins-Minerals (ONE-A-DAY PROACTIVE 65+) TABS Take 1 tablet by mouth daily.   Yes [provider]  OVER THE COUNTER MEDICATION Take 1-2 capsules by mouth See admin instructions. Focus Factor capsules: Take 1-2 capsules by mouth daily   Yes [provider]  vitamin C (ASCORBIC ACID) 500 MG tablet  Take 500 mg by mouth daily.   Yes [provider]  vitamin E 400 UNIT capsule Take 400 Units by mouth daily.   Yes [provider]  mirtazapine (REMERON) 7.5 MG tablet Take 1 tablet (7.5 mg total) by mouth at bedtime. Patient not taking: Reported on 04/28/2019 11/11/18   Rutherford Guys, MD    Physical Exam: Vitals:   04/28/19 2125 04/28/19 2130 04/28/19 2158 04/29/19 0543  BP: (!) 145/61 138/67 (!) 160/74 (!) 113/52  Pulse: 99 100 100 (!) 101  Resp: '19 18 18 18  ' Temp:    97.7 F (36.5 C)  TempSrc:      SpO2: 96% 97% 97% 99%  Weight:      Height:        Physical Exam  Constitutional: No distress.  Frail elderly female  HENT:  Head: Normocephalic.  Dry mucous membranes  Eyes: Right eye exhibits no discharge. Left eye exhibits no discharge.  Neck: Neck supple.  Cardiovascular: Normal rate, regular rhythm and intact distal pulses.  Pulmonary/Chest: She has no wheezes.  Equal air entry upon auscultation of anterior lung fields Examination limited due to lack of patient cooperation  Abdominal: Soft. Bowel sounds are normal. She exhibits no distension. There is abdominal tenderness. There is no rebound and no guarding.  Generalized tenderness to palpation  Musculoskeletal:        General: No edema.  Neurological:  Awake and alert Oriented to self only Following commands Strength 5 out of 5 in bilateral upper and lower extremities.  Skin: Skin is warm and dry. She is not diaphoretic.     Labs on Admission: I have personally reviewed following labs and imaging studies  CBC: Recent Labs  Lab 04/28/19 1632  WBC 10.1  NEUTROABS 8.1*  HGB 12.7  HCT 41.0  MCV 94.0  PLT 621   Basic Metabolic Panel: Recent Labs  Lab 04/28/19 1632  NA 140  K 3.7  CL 105  CO2 21*  GLUCOSE 89  BUN 18  CREATININE 1.03*  CALCIUM 8.0*  MG 2.1   GFR: Estimated Creatinine Clearance: 23.4 mL/min (A) (by C-G formula based on SCr of 1.03 mg/dL (H)). Liver Function  Tests: Recent Labs  Lab 04/28/19 1632  AST 55*  ALT 24  ALKPHOS 58  BILITOT 1.3*  PROT 6.1*  ALBUMIN 3.5   Recent Labs  Lab 04/28/19 1632  LIPASE 20   Recent Labs  Lab 04/28/19 1633  AMMONIA 12   Coagulation Profile: No results for input(s): INR, PROTIME in the last 168 hours. Cardiac Enzymes: Recent Labs  Lab 04/28/19 1632 04/28/19 2223 04/29/19 0247  TROPONINI 0.05* 0.29* 0.21*   BNP (last 3 results) No results for input(s): PROBNP in the last 8760 hours. HbA1C: No results for input(s): HGBA1C in the last 72 hours. CBG: Recent Labs  Lab 04/28/19 1623  GLUCAP 82   Lipid Profile: No results for input(s): CHOL, HDL,  LDLCALC, TRIG, CHOLHDL, LDLDIRECT in the last 72 hours. Thyroid Function Tests: Recent Labs    04/28/19 1632  FREET4 1.37*   Anemia Panel: No results for input(s): VITAMINB12, FOLATE, FERRITIN, TIBC, IRON, RETICCTPCT in the last 72 hours. Urine analysis:    Component Value Date/Time   COLORURINE YELLOW 04/28/2019 1632   APPEARANCEUR CLOUDY (A) 04/28/2019 1632   LABSPEC 1.011 04/28/2019 1632   PHURINE 6.0 04/28/2019 1632   GLUCOSEU NEGATIVE 04/28/2019 1632   HGBUR SMALL (A) 04/28/2019 [place] 04/28/2019 1632   BILIRUBINUR negative 03/30/2017 1641   BILIRUBINUR NEG 07/22/2015 1403   KETONESUR 20 (A) 04/28/2019 1632   PROTEINUR 30 (A) 04/28/2019 1632   UROBILINOGEN 0.2 03/30/2017 1641   NITRITE NEGATIVE 04/28/2019 1632   LEUKOCYTESUR LARGE (A) 04/28/2019 1632    Radiological Exams on Admission: Dg Knee 2 Views Left  Result Date: 04/28/2019 CLINICAL DATA:  Fall EXAM: LEFT KNEE - 1-2 VIEW COMPARISON:  03/30/2017 FINDINGS: No fracture or dislocation. Small knee effusion. Mild patellofemoral and medial joint space degenerative change. Moderate severe degenerative change of the lateral joint space with subarticular sclerosis and prominent spurring. IMPRESSION: 1. Arthritis of the knee without acute osseous abnormality 2.  Small knee effusion Electronically Signed   By: Donavan Foil M.D.   On: 04/28/2019 19:07   Dg Abd 1 View  Result Date: 04/28/2019 CLINICAL DATA:  Abdominal pain EXAM: ABDOMEN - 1 VIEW COMPARISON:  09/21/2013 FINDINGS: Severe scoliosis of the spine. Nonobstructed bowel-gas pattern. No radiopaque calculi. IMPRESSION: Nonobstructed bowel-gas pattern. Electronically Signed   By: Donavan Foil M.D.   On: 04/28/2019 21:27   Ct Head Wo Contrast  Result Date: 04/28/2019 CLINICAL DATA:  Status post fall. Syncopal episode. EXAM: CT HEAD WITHOUT CONTRAST CT CERVICAL SPINE WITHOUT CONTRAST TECHNIQUE: Multidetector CT imaging of the head and cervical spine was performed following the standard protocol without intravenous contrast. Multiplanar CT image reconstructions of the cervical spine were also generated. COMPARISON:  None. FINDINGS: CT HEAD FINDINGS Brain: No evidence of acute infarction, hemorrhage, hydrocephalus, extra-axial collection or mass lesion/mass effect. Moderate brain parenchymal volume loss and deep white matter microangiopathy. Vascular: Calcific atherosclerotic disease at the skull base. Skull: Normal. Negative for fracture or focal lesion. Sinuses/Orbits: Partial opacification of the right mastoid air cells. Mucosal thickening of the bilateral ethmoid sinuses. Other: None. CT CERVICAL SPINE FINDINGS Alignment: C4-C5 2 mm anterolisthesis, likely degenerative. Skull base and vertebrae: No acute fracture. No primary bone lesion or focal pathologic process. Soft tissues and spinal canal: No prevertebral fluid or swelling. No visible canal hematoma. Disc levels:  Multilevel osteoarthritic changes. Upper chest: Biapical scarring. Other: None. IMPRESSION: 1. No acute intracranial abnormality. 2. Atrophy, chronic microvascular disease. 3. No evidence of acute traumatic injury to cervical spine. 4. Multilevel osteoarthritic changes of the cervical spine. 5. Partial opacification of the right mastoid air  cells. Ethmoid sinusitis. Electronically Signed   By: Fidela Salisbury M.D.   On: 04/28/2019 18:16   Ct Cervical Spine Wo Contrast  Result Date: 04/28/2019 CLINICAL DATA:  Status post fall. Syncopal episode. EXAM: CT HEAD WITHOUT CONTRAST CT CERVICAL SPINE WITHOUT CONTRAST TECHNIQUE: Multidetector CT imaging of the head and cervical spine was performed following the standard protocol without intravenous contrast. Multiplanar CT image reconstructions of the cervical spine were also generated. COMPARISON:  None. FINDINGS: CT HEAD FINDINGS Brain: No evidence of acute infarction, hemorrhage, hydrocephalus, extra-axial collection or mass lesion/mass effect. Moderate brain parenchymal volume loss and deep white matter microangiopathy.  Vascular: Calcific atherosclerotic disease at the skull base. Skull: Normal. Negative for fracture or focal lesion. Sinuses/Orbits: Partial opacification of the right mastoid air cells. Mucosal thickening of the bilateral ethmoid sinuses. Other: None. CT CERVICAL SPINE FINDINGS Alignment: C4-C5 2 mm anterolisthesis, likely degenerative. Skull base and vertebrae: No acute fracture. No primary bone lesion or focal pathologic process. Soft tissues and spinal canal: No prevertebral fluid or swelling. No visible canal hematoma. Disc levels:  Multilevel osteoarthritic changes. Upper chest: Biapical scarring. Other: None. IMPRESSION: 1. No acute intracranial abnormality. 2. Atrophy, chronic microvascular disease. 3. No evidence of acute traumatic injury to cervical spine. 4. Multilevel osteoarthritic changes of the cervical spine. 5. Partial opacification of the right mastoid air cells. Ethmoid sinusitis. Electronically Signed   By: Fidela Salisbury M.D.   On: 04/28/2019 18:16   Dg Chest Portable 1 View  Result Date: 04/28/2019 CLINICAL DATA:  Altered mental status, fall EXAM: PORTABLE CHEST 1 VIEW COMPARISON:  06/10/2006 FINDINGS: Pleural and parenchymal scarring at the apices. No  acute airspace disease or effusion. Stable cardiomediastinal silhouette with aortic atherosclerosis. No pneumothorax. IMPRESSION: No active disease.  Stable scarring at the apices. Electronically Signed   By: Donavan Foil M.D.   On: 04/28/2019 19:06    EKG: Independently reviewed.  Sinus rhythm (heart rate 99), baseline tremor.  Assessment/Plan Principal Problem:   UTI (urinary tract infection) Active Problems:   Pressure injury of skin   Acute encephalopathy   Syncope   Hypertensive urgency   UTI Afebrile and no significant leukocytosis.  No significant lactic acidosis.  UA with evidence of UTI. -Ceftriaxone -Urine culture pending -IV fluid held given significantly elevated blood pressure.  Acute encephalopathy Likely secondary to UTI.  Head CT negative for acute finding.  Neuro exam nonfocal.  Ammonia level normal. -Management of UTI as mentioned above -Continue to monitor for mental status changes  Syncope, fall ? Orthostatic hypotension.  Patient was hypotensive on EMS arrival and received a 500 cc fluid bolus.  However, in the ED she was hypertensive with systolic ranging from 458K to 210s.  Troponin elevated but EKG showing sinus rhythm and not suggestive of ACS.  Patient denies any chest pain and appears comfortable on exam.  No seizure-like activity noted by EMS.  Head CT negative for acute finding. -Cardiac monitoring -Check orthostatics -No prior echocardiogram results in the chart.  Echo has been ordered. -PT evaluation  Hypertensive urgency, resolved Blood pressure elevated in the ED, now improved. -Hydralazine PRN -Continue to monitor blood pressure closely  Elevated troponin Patient denies any chest pain and appears comfortable on exam.  Troponin 0.05 >0.29 >0.21.  EKG not suggestive of ACS.  Suspect troponin elevation is related to significant blood pressure elevation upon initial arrival to the hospital. -Cardiac monitoring -Trend  troponin -Echocardiogram  Elevated LFTs AST mildly elevated at 55, T bili mildly elevated at 1.3.  ALT and alk phos normal.  T bili was 0.4 in January 2020. -Right upper quadrant ultrasound  Abdominal pain Patient appeared comfortable at rest but had generalized abdominal tenderness and guarding on palpation.  X-ray showing nonobstructive bowel gas pattern. -Continue to monitor  History of thyroidectomy on thyroid hormone replacement therapy Free T4 mildly elevated at 1.37, improved compared to previous labs from a year ago.  -TSH pending -Continue home Synthroid  DVT prophylaxis: Lovenox Code Status: Full code at this time.  Please discuss with family in the morning. Family Communication: No family available at this time. Disposition Plan: Anticipate discharge  after clinical improvement. Consults called: None Admission status: It is my clinical opinion that referral for OBSERVATION is reasonable and necessary in this patient based on the above information provided. The aforementioned taken together are felt to place the patient at high risk for further clinical deterioration. However it is anticipated that the patient may be medically stable for discharge from the hospital within 24 to 48 hours.  The medical decision making on this patient was of high complexity and the patient is at high risk for clinical deterioration, therefore this is a level 3 visit.  Shela Leff MD [facility] Pager [phone]  If 7PM-7AM, please contact night-coverage [email or web address] Password [email or web address]  04/29/2019, 5:59 AM

## 2019-04-28 NOTE — ED Notes (Signed)
Patient transported to X-ray 

## 2019-04-28 NOTE — ED Notes (Signed)
Pt contact (daughter) Rowe Pavy [phone]

## 2019-04-28 NOTE — ED Notes (Signed)
Sister, brenda- [phone]

## 2019-04-28 NOTE — ED Notes (Signed)
Upon arrival pt incontinent of urine.

## 2019-04-28 NOTE — ED Triage Notes (Signed)
Per EMS: pt from home alone with c/o a witnessed fall (by family). No loc. Upon arrival of fire department, pt had a syncopal episode.  Initial BP palpated by EMS 50. EMS administered 546mL of NS.  EMS vitals:  BP 160/88 CBG 113 Pulse 94 Temp 98.6

## 2019-04-28 NOTE — ED Notes (Addendum)
5[address] Floor request to manage BP before taking up to floor. Dr. Marlowe Sax informed.

## 2019-04-28 NOTE — ED Notes (Addendum)
ED TO INPATIENT HANDOFF REPORT  ED Nurse Name and Phone #:  Dalphine Handing 5  S Name/Age/Gender Patricia Campbell 85 y.o. female Room/Bed: 039C/039C  Code Status   Code Status: Not on file  Home/SNF/Other Home Patient oriented to: self Is this baseline? NOT baseline  Triage Complete: Triage complete  Chief Complaint Syncope; AMS  Triage Note Per EMS: pt from home alone with c/o a witnessed fall (by family). No loc. Upon arrival of fire department, pt had a syncopal episode.  Initial BP palpated by EMS 50. EMS administered 577mL of NS.  EMS vitals:  BP 160/88 CBG 113 Pulse 94 Temp 98.6    Allergies Allergies  Allergen Reactions  . Codeine Other (See Comments)    Level of Care/Admitting Diagnosis ED Disposition    ED Disposition Condition Comment   Admit  Hospital Area: [place] [100100]  Level of Care: Telemetry Medical [104]  I expect the patient will be discharged within 24 hours: No (not a candidate for 5C-Observation unit)  Covid Evaluation: Screening Protocol (No Symptoms)  Diagnosis: UTI (urinary tract infection) [456256]  Admitting Physician: Shela Leff [[id number]]  Attending Physician: Shela Leff [[id number]]  PT Class (Do Not Modify): Observation [104]  PT Acc Code (Do Not Modify): Observation [10022]       B Medical/Surgery History Past Medical History:  Diagnosis Date  . Cancer [facility])    Thyroid cancer >> Thyroidectomy.  . Degenerative disc disease, lumbar   . Degenerative disc disease, thoracic   . Osteoporosis   . Scoliosis   . Thyroid disease    Past Surgical History:  Procedure Laterality Date  . TONSILLECTOMY    . TOTAL THYROIDECTOMY     Pt reports hx of thyroid cancer.     A IV Location/Drains/Wounds Patient Lines/Drains/Airways Status   Active Line/Drains/Airways    Name:   Placement date:   Placement time:   Site:   Days:   Peripheral IV 04/28/19 Left Forearm   04/28/19    1801    Forearm   less  than 1   Peripheral IV 04/28/19 Left Antecubital   04/28/19    1802    Antecubital   less than 1          Intake/Output Last 24 hours  Intake/Output Summary (Last 24 hours) at 04/28/2019 2007 Last data filed at 04/28/2019 1824 Gross per 24 hour  Intake 100 ml  Output -  Net 100 ml    Labs/Imaging Results for orders placed or performed during the hospital encounter of 04/28/19 (from the past 48 hour(s))  CBG monitoring, ED     Status: None   Collection Time: 04/28/19  4:23 PM  Result Value Ref Range   Glucose-Capillary 82 70 - 99 mg/dL  CBC with Differential     Status: Abnormal   Collection Time: 04/28/19  4:32 PM  Result Value Ref Range   WBC 10.1 4.0 - 10.5 K/uL   RBC 4.36 3.87 - 5.11 MIL/uL   Hemoglobin 12.7 12.0 - 15.0 g/dL   HCT 41.0 36.0 - 46.0 %   MCV 94.0 80.0 - 100.0 fL   MCH 29.1 26.0 - 34.0 pg   MCHC 31.0 30.0 - 36.0 g/dL   RDW 14.1 11.5 - 15.5 %   Platelets 188 150 - 400 K/uL   nRBC 0.0 0.0 - 0.2 %   Neutrophils Relative % 80 %   Neutro Abs 8.1 (H) 1.7 - 7.7 K/uL   Lymphocytes Relative 13 %  Lymphs Abs 1.3 0.7 - 4.0 K/uL   Monocytes Relative 7 %   Monocytes Absolute 0.7 0.1 - 1.0 K/uL   Eosinophils Relative 0 %   Eosinophils Absolute 0.0 0.0 - 0.5 K/uL   Basophils Relative 0 %   Basophils Absolute 0.0 0.0 - 0.1 K/uL   Immature Granulocytes 0 %   Abs Immature Granulocytes 0.04 0.00 - 0.07 K/uL    Comment: Performed at [facility], [place] [address] [place], [place] [address]  Basic metabolic panel     Status: Abnormal   Collection Time: 04/28/19  4:32 PM  Result Value Ref Range   Sodium 140 135 - 145 mmol/L   Potassium 3.7 3.5 - 5.1 mmol/L   Chloride 105 98 - 111 mmol/L   CO2 21 (L) 22 - 32 mmol/L   Glucose, Bld 89 70 - 99 mg/dL   BUN 18 8 - 23 mg/dL   Creatinine, Ser 1.03 (H) 0.44 - 1.00 mg/dL   Calcium 8.0 (L) 8.9 - 10.3 mg/dL   GFR calc non Af Amer 48 (L) >60 mL/min   GFR calc Af Amer 55 (L) >60 mL/min   Anion gap 14 5 - 15    Comment:  Performed at [facility], [place] [address], [place], [place] [address]  Hepatic function panel     Status: Abnormal   Collection Time: 04/28/19  4:32 PM  Result Value Ref Range   Total Protein 6.1 (L) 6.5 - 8.1 g/dL   Albumin 3.5 3.5 - 5.0 g/dL   AST 55 (H) 15 - 41 U/L   ALT 24 0 - 44 U/L   Alkaline Phosphatase 58 38 - 126 U/L   Total Bilirubin 1.3 (H) 0.3 - 1.2 mg/dL   Bilirubin, Direct 0.2 0.0 - 0.2 mg/dL   Indirect Bilirubin 1.1 (H) 0.3 - 0.9 mg/dL    Comment: Performed at [place] [address] [place], [place] [address]  Magnesium     Status: None   Collection Time: 04/28/19  4:32 PM  Result Value Ref Range   Magnesium 2.1 1.7 - 2.4 mg/dL    Comment: Performed at [facility], [place] [address], [place], [place] [address]  Troponin I - ONCE - STAT     Status: Abnormal   Collection Time: 04/28/19  4:32 PM  Result Value Ref Range   Troponin I 0.05 (HH) <0.03 ng/mL    Comment: CRITICAL RESULT CALLED TO, READ BACK BY AND VERIFIED WITH: C MARSHALLS,RN 1812 04/28/2019 WBOND Performed at [facility], [place] [address] [place], [place] [address]   Urinalysis, Routine w reflex microscopic     Status: Abnormal   Collection Time: 04/28/19  4:32 PM  Result Value Ref Range   Color, Urine YELLOW YELLOW   APPearance CLOUDY (A) CLEAR   Specific Gravity, Urine 1.011 1.005 - 1.030   pH 6.0 5.0 - 8.0   Glucose, UA NEGATIVE NEGATIVE mg/dL   Hgb urine dipstick SMALL (A) NEGATIVE   Bilirubin Urine NEGATIVE NEGATIVE   Ketones, ur 20 (A) NEGATIVE mg/dL   Protein, ur 30 (A) NEGATIVE mg/dL   Nitrite NEGATIVE NEGATIVE   Leukocytes,Ua LARGE (A) NEGATIVE   RBC / HPF 11-20 0 - 5 RBC/hpf   WBC, UA >50 (H) 0 - 5 WBC/hpf   Bacteria, UA MANY (A) NONE SEEN   Squamous Epithelial / LPF 0-5 0 - 5   Budding Yeast PRESENT     Comment: Performed  at [facility], [place] [address], [place], [place] [address]  T4, free     Status: Abnormal   Collection Time: 04/28/19  4:32 PM   Result Value Ref Range   Free T4 1.37 (H) 0.61 - 1.12 ng/dL    Comment: (NOTE) Biotin ingestion may interfere with free T4 tests. If the results are inconsistent with the TSH level, previous test results, or the clinical presentation, then consider biotin interference. If needed, order repeat testing after stopping biotin. Performed at [facility], [place] [address], [place], [place] [address]   Lipase, blood     Status: None   Collection Time: 04/28/19  4:32 PM  Result Value Ref Range   Lipase 20 11 - 51 U/L    Comment: Performed at [facility], [place] [address], [place], [place] [address]  Ammonia     Status: None   Collection Time: 04/28/19  4:33 PM  Result Value Ref Range   Ammonia 12 9 - 35 umol/L    Comment: Performed at [facility], [place] [address], [place], [place] [address]  Acetaminophen level     Status: Abnormal   Collection Time: 04/28/19  4:33 PM  Result Value Ref Range   Acetaminophen (Tylenol), Serum <10 (L) 10 - 30 ug/mL    Comment: (NOTE) Therapeutic concentrations vary significantly. A range of 10-30 ug/mL  may be an effective concentration for many patients. However, some  are best treated at concentrations outside of this range. Acetaminophen concentrations >150 ug/mL at 4 hours after ingestion  and >50 ug/mL at 12 hours after ingestion are often associated with  toxic reactions. Performed at [facility], [place] [address], [place], [place] [address]   Salicylate level     Status: None   Collection Time: 04/28/19  4:33 PM  Result Value Ref Range   Salicylate Lvl <4.8 2.8 - 30.0 mg/dL    Comment: Performed at [place] [address] [place], [place] [address]  Lactic acid, plasma     Status: Abnormal   Collection Time: 04/28/19  4:33 PM  Result Value Ref Range   Lactic Acid, Venous 2.1 (HH) 0.5 - 1.9 mmol/L    Comment: CRITICAL RESULT CALLED TO, READ BACK BY AND VERIFIED WITH: L CHILDRESS,RN 1731 04/28/2019  WBOND Performed at [facility], [place] [address], [place], [place] [address]    Dg Knee 2 Views Left  Result Date: 04/28/2019 CLINICAL DATA:  Fall EXAM: LEFT KNEE - 1-2 VIEW COMPARISON:  03/30/2017 FINDINGS: No fracture or dislocation. Small knee effusion. Mild patellofemoral and medial joint space degenerative change. Moderate severe degenerative change of the lateral joint space with subarticular sclerosis and prominent spurring. IMPRESSION: 1. Arthritis of the knee without acute osseous abnormality 2. Small knee effusion Electronically Signed   By: Donavan Foil M.D.   On: 04/28/2019 19:07   Ct Head Wo Contrast  Result Date: 04/28/2019 CLINICAL DATA:  Status post fall. Syncopal episode. EXAM: CT HEAD WITHOUT CONTRAST CT CERVICAL SPINE WITHOUT CONTRAST TECHNIQUE: Multidetector CT imaging of the head and cervical spine was performed following the standard protocol without intravenous contrast. Multiplanar CT image reconstructions of the cervical spine were also generated. COMPARISON:  None. FINDINGS: CT HEAD FINDINGS Brain: No evidence of acute infarction, hemorrhage, hydrocephalus, extra-axial collection or mass lesion/mass effect. Moderate brain parenchymal volume loss and deep white matter microangiopathy. Vascular: Calcific atherosclerotic disease at the skull base. Skull: Normal. Negative for  fracture or focal lesion. Sinuses/Orbits: Partial opacification of the right mastoid air cells. Mucosal thickening of the bilateral ethmoid sinuses. Other: None. CT CERVICAL SPINE FINDINGS Alignment: C4-C5 2 mm anterolisthesis, likely degenerative. Skull base and vertebrae: No acute fracture. No primary bone lesion or focal pathologic process. Soft tissues and spinal canal: No prevertebral fluid or swelling. No visible canal hematoma. Disc levels:  Multilevel osteoarthritic changes. Upper chest: Biapical scarring. Other: None. IMPRESSION: 1. No acute intracranial abnormality. 2. Atrophy, chronic  microvascular disease. 3. No evidence of acute traumatic injury to cervical spine. 4. Multilevel osteoarthritic changes of the cervical spine. 5. Partial opacification of the right mastoid air cells. Ethmoid sinusitis. Electronically Signed   By: Fidela Salisbury M.D.   On: 04/28/2019 18:16   Ct Cervical Spine Wo Contrast  Result Date: 04/28/2019 CLINICAL DATA:  Status post fall. Syncopal episode. EXAM: CT HEAD WITHOUT CONTRAST CT CERVICAL SPINE WITHOUT CONTRAST TECHNIQUE: Multidetector CT imaging of the head and cervical spine was performed following the standard protocol without intravenous contrast. Multiplanar CT image reconstructions of the cervical spine were also generated. COMPARISON:  None. FINDINGS: CT HEAD FINDINGS Brain: No evidence of acute infarction, hemorrhage, hydrocephalus, extra-axial collection or mass lesion/mass effect. Moderate brain parenchymal volume loss and deep white matter microangiopathy. Vascular: Calcific atherosclerotic disease at the skull base. Skull: Normal. Negative for fracture or focal lesion. Sinuses/Orbits: Partial opacification of the right mastoid air cells. Mucosal thickening of the bilateral ethmoid sinuses. Other: None. CT CERVICAL SPINE FINDINGS Alignment: C4-C5 2 mm anterolisthesis, likely degenerative. Skull base and vertebrae: No acute fracture. No primary bone lesion or focal pathologic process. Soft tissues and spinal canal: No prevertebral fluid or swelling. No visible canal hematoma. Disc levels:  Multilevel osteoarthritic changes. Upper chest: Biapical scarring. Other: None. IMPRESSION: 1. No acute intracranial abnormality. 2. Atrophy, chronic microvascular disease. 3. No evidence of acute traumatic injury to cervical spine. 4. Multilevel osteoarthritic changes of the cervical spine. 5. Partial opacification of the right mastoid air cells. Ethmoid sinusitis. Electronically Signed   By: Fidela Salisbury M.D.   On: 04/28/2019 18:16   Dg Chest Portable  1 View  Result Date: 04/28/2019 CLINICAL DATA:  Altered mental status, fall EXAM: PORTABLE CHEST 1 VIEW COMPARISON:  06/10/2006 FINDINGS: Pleural and parenchymal scarring at the apices. No acute airspace disease or effusion. Stable cardiomediastinal silhouette with aortic atherosclerosis. No pneumothorax. IMPRESSION: No active disease.  Stable scarring at the apices. Electronically Signed   By: Donavan Foil M.D.   On: 04/28/2019 19:06    Pending Labs Unresulted Labs (From admission, onward)    Start     Ordered   04/28/19 1810  Novel Coronavirus,NAA,(SEND-OUT TO REF LAB - TAT 24-48 hrs); Hosp Order  (Asymptomatic Patients Labs)  Once,   STAT    Question:  Rule Out  Answer:  Yes   04/28/19 1809   04/28/19 1727  Urine culture  ONCE - STAT,   STAT     04/28/19 1727   04/28/19 1602  TSH  ONCE - STAT,   STAT     04/28/19 1602          Vitals/Pain Today's Vitals   04/28/19 1845 04/28/19 1900 04/28/19 1930 04/28/19 1935  BP: (!) 187/92 (!) 197/85 (!) 184/82   Pulse: (!) 106 98 (!) 104   Resp: 15 16 17    Temp:      TempSrc:      SpO2: 98% 99% 99%   Weight:  Height:      PainSc:    0-No pain    Isolation Precautions No active isolations  Medications Medications  cefTRIAXone (ROCEPHIN) 1 g in sodium chloride 0.9 % 100 mL IVPB (0 g Intravenous Stopped 04/28/19 1824)    Mobility walks with device High fall risk   Focused Assessments -   R Recommendations: See Admitting Provider Note  Report given to:   Additional Notes:

## 2019-04-28 NOTE — ED Notes (Signed)
Nurse Navigator communication: This RN spoke with the patients Sister [name]. She had previously gotten a call from a provider she believes. A brief update was given. She was appreciative of the call.

## 2019-04-28 NOTE — ED Provider Notes (Signed)
[facility] EMERGENCY DEPARTMENT Provider Note   CSN: [id number] Arrival date & time:       History   Chief Complaint No chief complaint on file.   HPI Patricia Campbell is a 85 y.o. female.     The history is provided by the patient, the EMS personnel and medical records.  Altered Mental Status Presenting symptoms: confusion and disorientation   Presenting symptoms: no lethargy, no partial responsiveness and no unresponsiveness   Severity:  Moderate Most recent episode:  Today Episode history:  Continuous Duration: unable to specify  Timing:  Constant Progression:  Unable to specify Chronicity:  New Context: not taking medications as prescribed   Context: not dementia, not nursing home resident, not recent change in medication, not recent illness and not recent infection   Associated symptoms: abdominal pain and bladder incontinence   Associated symptoms: normal movement, no difficulty breathing, no fever, no nausea, no rash, no seizures and no weakness     Past Medical History:  Diagnosis Date  . Cancer [facility])    Thyroid cancer >> Thyroidectomy.  . Degenerative disc disease, lumbar   . Degenerative disc disease, thoracic   . Osteoporosis   . Scoliosis   . Thyroid disease     Patient Active Problem List   Diagnosis Date Noted  . Memory loss 08/29/2016  . Insomnia 07/23/2015  . Degenerative disc disease, lumbar 07/23/2015  . History of thyroid cancer 07/23/2015  . Lumbago 06/27/2015  . Idiopathic scoliosis 03/03/2013  . Schatzki's ring 12/12/2012  . Hypothyroidism 09/16/2012  . Osteoporosis 09/16/2012    Past Surgical History:  Procedure Laterality Date  . TONSILLECTOMY    . TOTAL THYROIDECTOMY     Pt reports hx of thyroid cancer.     OB History   No obstetric history on file.      Home Medications    Prior to Admission medications   Medication Sig Start Date End Date Taking? Authorizing Provider  levothyroxine (SYNTHROID) 75  MCG tablet TAKE 1 TABLET BY MOUTH EVERY MORNING ON AN EMPTY STOMACH Patient taking differently: Take 75 mcg by mouth daily before breakfast. TAKE ON AN EMPTY STOMACH 04/13/19  Yes Rutherford Guys, MD  Multiple Vitamins-Minerals (OCUVITE PO) Take by mouth.   Yes [provider]  OVER THE COUNTER MEDICATION Alfalfa 500 mg taking  2 a day   Yes [provider]  vitamin C (ASCORBIC ACID) 500 MG tablet Take 500 mg by mouth daily.   Yes [provider]  meloxicam (MOBIC) 7.5 MG tablet TAKE 1 TABLET BY MOUTH KNEE DAILY AFTER BREAKFAST FOR PAIN AND INFLAMMATION Patient taking differently: Take 7.5 mg by mouth daily as needed. TAKE 1 TABLET BY MOUTH KNEE DAILY AFTER BREAKFAST FOR PAIN AND INFLAMMATION 02/13/19   Rutherford Guys, MD  mirtazapine (REMERON) 7.5 MG tablet Take 1 tablet (7.5 mg total) by mouth at bedtime. Patient not taking: Reported on 04/28/2019 11/11/18   Rutherford Guys, MD  Multiple Vitamin (MULTIVITAMIN) tablet Take 1 tablet by mouth daily.    [provider]  OVER THE COUNTER MEDICATION Focus Factor taking one daily    [provider]  OVER THE COUNTER MEDICATION Vitamin E 400 iu daily    [provider]  OVER THE COUNTER MEDICATION Co Q 10 100 mg taking one daily    [provider]  OVER THE COUNTER MEDICATION D 3 2000 iu taking daily    [provider]  Family History History reviewed. No pertinent family history.  Social History Social History   Tobacco Use  . Smoking status: Never Smoker  . Smokeless tobacco: Never Used  Substance Use Topics  . Alcohol use: No  . Drug use: No     Allergies   Codeine   Review of Systems Review of Systems  Constitutional: Negative for fever.  Gastrointestinal: Positive for abdominal pain. Negative for nausea.  Genitourinary: Positive for bladder incontinence.  Skin: Negative for rash.  Neurological: Negative for seizures and weakness.  Psychiatric/Behavioral:  Positive for confusion.  All other systems reviewed and are negative.    Physical Exam Updated Vital Signs BP (!) 177/76   Pulse 100   Temp (!) 96.9 F (36.1 C) (Rectal)   Resp 19   Ht 5\' 1"  (1.549 m)   Wt 40.8 kg   SpO2 97%   BMI 17.01 kg/m   Physical Exam Vitals signs and nursing note reviewed.  Constitutional:      Appearance: She is well-developed.  HENT:     Head: Normocephalic and atraumatic.     Comments: No obvious palpable depressions for skull fracture appreciated    Mouth/Throat:     Comments: Uvula midline No obvious asymmetric palatine elevation No obvious swellings posterior oropharynx No obvious swellings appreciated on neck exam Patient able to move neck in all directions without difficulty, tolerating secretions appropriately  Eyes:     Conjunctiva/sclera: Conjunctivae normal.     Pupils: Pupils are equal, round, and reactive to light.     Comments: Patient variably cooperative with extraocular movement testing, able to track across midline to the right consistently, will not track or cross midline to the left consistently    Neck:     Musculoskeletal: Neck supple.     Comments: No midline tenderness cervical spine with palpation No obvious step-offs or deformities cervical spine C-collar in place No midline thoracic or lumbar spine tenderness with palpation No obvious step-offs or deformities thoracic or lumbar spine  Cardiovascular:     Rate and Rhythm: Normal rate.  Pulmonary:     Effort: Pulmonary effort is normal. No respiratory distress.     Breath sounds: No stridor. No wheezing or rhonchi.  Abdominal:     Palpations: Abdomen is soft.     Tenderness: There is abdominal tenderness. There is no guarding or rebound.     Comments: Suprapubic tenderness with palpation  Musculoskeletal:     Right lower leg: No edema.     Left lower leg: No edema.     Comments: Tenderness with palpation left knee   Moves all extremities well, grossly  normal motor strength bilateral upper extremities, bilateral lower extremities, grossly normal sensation bilateral upper extremities, bilateral lower extremities, 2+ pulses throughout  Skin:    General: Skin is warm and dry.  Neurological:     Mental Status: She is alert.     Comments: Patient alert and conversant, intermittently confused, oriented to self, place, knows what city and states she lives in, does not know what year it is      ED Treatments / Results  Labs (all labs ordered are listed, but only abnormal results are displayed) Labs Reviewed  CBC WITH DIFFERENTIAL/PLATELET - Abnormal; Notable for the following components:      Result Value   Neutro Abs 8.1 (*)    All other components within normal limits  BASIC METABOLIC PANEL - Abnormal; Notable for the following components:   CO2 21 (*)  Creatinine, Ser 1.03 (*)    Calcium 8.0 (*)    GFR calc non Af Amer 48 (*)    GFR calc Af Amer 55 (*)    All other components within normal limits  HEPATIC FUNCTION PANEL - Abnormal; Notable for the following components:   Total Protein 6.1 (*)    AST 55 (*)    Total Bilirubin 1.3 (*)    Indirect Bilirubin 1.1 (*)    All other components within normal limits  TROPONIN I - Abnormal; Notable for the following components:   Troponin I 0.05 (*)    All other components within normal limits  URINALYSIS, ROUTINE W REFLEX MICROSCOPIC - Abnormal; Notable for the following components:   APPearance CLOUDY (*)    Hgb urine dipstick SMALL (*)    Ketones, ur 20 (*)    Protein, ur 30 (*)    Leukocytes,Ua LARGE (*)    WBC, UA >50 (*)    Bacteria, UA MANY (*)    All other components within normal limits  ACETAMINOPHEN LEVEL - Abnormal; Notable for the following components:   Acetaminophen (Tylenol), Serum <10 (*)    All other components within normal limits  LACTIC ACID, PLASMA - Abnormal; Notable for the following components:   Lactic Acid, Venous 2.1 (*)    All other components within  normal limits  URINE CULTURE  NOVEL CORONAVIRUS, NAA (HOSPITAL ORDER, SEND-OUT TO REF LAB)  MAGNESIUM  AMMONIA  SALICYLATE LEVEL  LIPASE, BLOOD  TSH  T4, FREE  CBG MONITORING, ED    EKG EKG Interpretation  Date/Time:  Friday April 28 2019 16:05:22 EDT Ventricular Rate:  99 PR Interval:    QRS Duration: 92 QT Interval:  367 QTC Calculation: 471 R Axis:   72 Text Interpretation:  Normal sinus rhythm baseline tremor Confirmed by Isla Pence [phone]) on 04/28/2019 5:34:14 PM   Radiology No results found.  Procedures Procedures (including critical care time)  Medications Ordered in ED Medications  cefTRIAXone (ROCEPHIN) 1 g in sodium chloride 0.9 % 100 mL IVPB (1 g Intravenous New Bag/Given 04/28/19 1804)     Initial Impression / Assessment and Plan / ED Course  I have reviewed the triage vital signs and the nursing notes.  Pertinent labs & imaging results that were available during my care of the patient were reviewed by me and considered in my medical decision making (see chart for details).        Medical Decision Making: MARNISHA STAMPLEY is a 85 y.o. female who presented to the ED today with altered mental status, hypotension.  Past medical history significant for thyroid disease, osteoporosis, degenerative disc disease Reviewed and confirmed nursing documentation for past medical history, family history, social history.  On my initial exam, the pt was calm, intermittently confused, cooperative, follows commands appropriately, GCS 15, not tachycardic, initial blood pressure per EMS hypotensive, given IV fluids in route, afebrile, temperature 96.9, no increased work of breathing or respiratory distress, no signs of impending respiratory failure.   Multiple etiologies of altered mental status considered.  Trauma considered will obtain CT head and C-spine for further evaluation and care Infection seems seems possible given bladder incontinence, no reported fevers, will  obtain chest x-ray, urine studies to further assess Seizure seems less likely given history, no shaking or movements concerning for seizure, no history of epilepsy Stroke or intracranial abnormality seem less likely given history, will obtain CT head for further assessment Acute psychosis seems less likely as patient does not  appear to be responding to internal stimuli Ingestion/overdose versus withdrawal versus intoxication seem less likely given history, no physical exam findings concerning for ingestion or withdrawal Polypharmacy could be a potential etiology as patient is on multiple medications, no recent dosage changes, medication adjustments or new medications Electrolyte abnormality versus encephalopathy versus uremia versus acidosis seem possible but less likely given history, will obtain ammonia level, electrolyte panel, will calculate anion gap, assess bicarb All radiology and laboratory studies reviewed independently and with my attending physician, agree with reading provided by radiologist unless otherwise noted.   Lactic acid 2.1, Tylenol and salicylate levels negative, ammonia 12, white blood cells 10.1, hemoglobin 12.7, sodium 140, potassium 3.7, creatinine 1.0, CO2 21, urinalysis shows gross evidence of infection, antibiotics administered  CT imaging showed no gross emergent abnormalities  Upon reassessing patient, patient was calm, resting comfortably, given the patient lives alone, still intermittently confused in the emergency department, will admit patient for further evaluation and care Based on the above findings, I believe patient requires admission.  Patient admitted. The above care was discussed with and agreed upon by my attending physician. Emergency Department Medication Summary:  Medications  cefTRIAXone (ROCEPHIN) 1 g in sodium chloride 0.9 % 100 mL IVPB (1 g Intravenous New Bag/Given 04/28/19 1804)    Final Clinical Impressions(s) / ED Diagnoses   Final  diagnoses:  None    ED Discharge Orders    None       Lonzo Candy, MD 04/28/19 Hoy Register    Isla Pence, MD 04/28/19 2258

## 2019-04-28 NOTE — ED Notes (Signed)
Dr. Rathore at bedside 

## 2019-04-29 ENCOUNTER — Observation Stay (HOSPITAL_COMMUNITY): Payer: Medicare HMO

## 2019-04-29 DIAGNOSIS — Z66 Do not resuscitate: Secondary | ICD-10-CM | POA: Diagnosis present

## 2019-04-29 DIAGNOSIS — N399 Disorder of urinary system, unspecified: Secondary | ICD-10-CM | POA: Diagnosis present

## 2019-04-29 DIAGNOSIS — R7989 Other specified abnormal findings of blood chemistry: Secondary | ICD-10-CM | POA: Diagnosis not present

## 2019-04-29 DIAGNOSIS — E43 Unspecified severe protein-calorie malnutrition: Secondary | ICD-10-CM | POA: Diagnosis present

## 2019-04-29 DIAGNOSIS — M810 Age-related osteoporosis without current pathological fracture: Secondary | ICD-10-CM | POA: Diagnosis present

## 2019-04-29 DIAGNOSIS — L89151 Pressure ulcer of sacral region, stage 1: Secondary | ICD-10-CM | POA: Diagnosis present

## 2019-04-29 DIAGNOSIS — N390 Urinary tract infection, site not specified: Secondary | ICD-10-CM | POA: Diagnosis not present

## 2019-04-29 DIAGNOSIS — R69 Illness, unspecified: Secondary | ICD-10-CM | POA: Diagnosis not present

## 2019-04-29 DIAGNOSIS — R4182 Altered mental status, unspecified: Secondary | ICD-10-CM | POA: Diagnosis not present

## 2019-04-29 DIAGNOSIS — R55 Syncope and collapse: Secondary | ICD-10-CM

## 2019-04-29 DIAGNOSIS — R1010 Upper abdominal pain, unspecified: Secondary | ICD-10-CM

## 2019-04-29 DIAGNOSIS — Z1159 Encounter for screening for other viral diseases: Secondary | ICD-10-CM | POA: Diagnosis not present

## 2019-04-29 DIAGNOSIS — Z681 Body mass index (BMI) 19 or less, adult: Secondary | ICD-10-CM | POA: Diagnosis not present

## 2019-04-29 DIAGNOSIS — E890 Postprocedural hypothyroidism: Secondary | ICD-10-CM | POA: Diagnosis present

## 2019-04-29 DIAGNOSIS — I160 Hypertensive urgency: Secondary | ICD-10-CM | POA: Diagnosis not present

## 2019-04-29 DIAGNOSIS — Z7401 Bed confinement status: Secondary | ICD-10-CM | POA: Diagnosis not present

## 2019-04-29 DIAGNOSIS — R945 Abnormal results of liver function studies: Secondary | ICD-10-CM | POA: Diagnosis not present

## 2019-04-29 DIAGNOSIS — Z79890 Hormone replacement therapy (postmenopausal): Secondary | ICD-10-CM | POA: Diagnosis not present

## 2019-04-29 DIAGNOSIS — G9340 Encephalopathy, unspecified: Secondary | ICD-10-CM | POA: Diagnosis not present

## 2019-04-29 DIAGNOSIS — F05 Delirium due to known physiological condition: Secondary | ICD-10-CM | POA: Diagnosis not present

## 2019-04-29 DIAGNOSIS — M2550 Pain in unspecified joint: Secondary | ICD-10-CM | POA: Diagnosis not present

## 2019-04-29 DIAGNOSIS — Z79899 Other long term (current) drug therapy: Secondary | ICD-10-CM | POA: Diagnosis not present

## 2019-04-29 DIAGNOSIS — Z741 Need for assistance with personal care: Secondary | ICD-10-CM | POA: Diagnosis not present

## 2019-04-29 DIAGNOSIS — I959 Hypotension, unspecified: Secondary | ICD-10-CM | POA: Diagnosis not present

## 2019-04-29 DIAGNOSIS — Z20828 Contact with and (suspected) exposure to other viral communicable diseases: Secondary | ICD-10-CM | POA: Diagnosis present

## 2019-04-29 DIAGNOSIS — R740 Nonspecific elevation of levels of transaminase and lactic acid dehydrogenase [LDH]: Secondary | ICD-10-CM | POA: Diagnosis present

## 2019-04-29 DIAGNOSIS — M6281 Muscle weakness (generalized): Secondary | ICD-10-CM | POA: Diagnosis not present

## 2019-04-29 DIAGNOSIS — G9341 Metabolic encephalopathy: Secondary | ICD-10-CM | POA: Diagnosis present

## 2019-04-29 DIAGNOSIS — Z85850 Personal history of malignant neoplasm of thyroid: Secondary | ICD-10-CM | POA: Diagnosis not present

## 2019-04-29 DIAGNOSIS — Z23 Encounter for immunization: Secondary | ICD-10-CM | POA: Diagnosis not present

## 2019-04-29 DIAGNOSIS — R1319 Other dysphagia: Secondary | ICD-10-CM | POA: Diagnosis not present

## 2019-04-29 DIAGNOSIS — Z885 Allergy status to narcotic agent status: Secondary | ICD-10-CM | POA: Diagnosis not present

## 2019-04-29 DIAGNOSIS — W19XXXA Unspecified fall, initial encounter: Secondary | ICD-10-CM | POA: Diagnosis present

## 2019-04-29 DIAGNOSIS — R2681 Unsteadiness on feet: Secondary | ICD-10-CM | POA: Diagnosis not present

## 2019-04-29 DIAGNOSIS — B9620 Unspecified Escherichia coli [E. coli] as the cause of diseases classified elsewhere: Secondary | ICD-10-CM | POA: Diagnosis present

## 2019-04-29 DIAGNOSIS — I248 Other forms of acute ischemic heart disease: Secondary | ICD-10-CM | POA: Diagnosis present

## 2019-04-29 DIAGNOSIS — F0390 Unspecified dementia without behavioral disturbance: Secondary | ICD-10-CM | POA: Diagnosis present

## 2019-04-29 DIAGNOSIS — R410 Disorientation, unspecified: Secondary | ICD-10-CM | POA: Diagnosis not present

## 2019-04-29 LAB — HEPATIC FUNCTION PANEL
ALT: 24 U/L (ref 0–44)
AST: 56 U/L — ABNORMAL HIGH (ref 15–41)
Albumin: 3.3 g/dL — ABNORMAL LOW (ref 3.5–5.0)
Alkaline Phosphatase: 53 U/L (ref 38–126)
Bilirubin, Direct: 0.1 mg/dL (ref 0.0–0.2)
Indirect Bilirubin: 1.1 mg/dL — ABNORMAL HIGH (ref 0.3–0.9)
Total Bilirubin: 1.2 mg/dL (ref 0.3–1.2)
Total Protein: 6.1 g/dL — ABNORMAL LOW (ref 6.5–8.1)

## 2019-04-29 LAB — TROPONIN I
Troponin I: 0.21 ng/mL (ref <0.03)
Troponin I: 0.14 ng/mL (ref <0.03)

## 2019-04-29 LAB — GLUCOSE, CAPILLARY: Glucose-Capillary: 57 mg/dL — ABNORMAL LOW (ref 70–99)

## 2019-04-29 LAB — TSH: TSH: 16.056 u[IU]/mL — ABNORMAL HIGH (ref 0.350–4.500)

## 2019-04-29 MED ORDER — ATENOLOL 50 MG PO TABS
25.0000 mg | ORAL_TABLET | Freq: Every day | ORAL | Status: DC
  Administered 2019-04-29 – 2019-05-11 (×13): 25 mg via ORAL
  Filled 2019-04-29 (×13): qty 1

## 2019-04-29 MED ORDER — MIRTAZAPINE 15 MG PO TABS
7.5000 mg | ORAL_TABLET | Freq: Every day | ORAL | Status: DC
  Administered 2019-04-29 – 2019-05-10 (×12): 7.5 mg via ORAL
  Filled 2019-04-29 (×12): qty 1

## 2019-04-29 MED ORDER — ASPIRIN EC 81 MG PO TBEC
81.0000 mg | DELAYED_RELEASE_TABLET | Freq: Every day | ORAL | Status: DC
  Administered 2019-04-29 – 2019-05-11 (×13): 81 mg via ORAL
  Filled 2019-04-29 (×13): qty 1

## 2019-04-29 MED ORDER — AMLODIPINE BESYLATE 5 MG PO TABS: 5.0000 mg | ORAL_TABLET | Freq: Every day | ORAL | Status: DC

## 2019-04-29 NOTE — Evaluation (Signed)
Physical Therapy Evaluation Patient Details Name: Patricia Campbell MRN: [id number] DOB: [birth year] Today's Date: 04/29/2019   History of Present Illness  Patient is a 85 year old women. She presented to the hospital after aa witnessed fall by her family. Per note she has been having an altered mental status.  PMH: Thyroid disease, scoliosis, DDD of the lumbar and thhoriacic spine;  Clinical Impression  Patient required significant assist for all transfers. She was orthostatic and reported moderate syncope in standing. See below for blood pressures. She would benefit from rehab at a SNF unles 24 hour can be provided and 24 hour assist with her family. Acute therapy will continue to work with the patient. She was confused today so it was difficult to assess her home equipment.       Follow Up Recommendations SNF 24 hour supervision     Equipment Recommendations  (unknown )    Recommendations for Other Services Rehab consult     Precautions / Restrictions Precautions Precautions: Fall Restrictions Weight Bearing Restrictions: No      Mobility  Bed Mobility Overal bed mobility: Needs Assistance Bed Mobility: Supine to Sit     Supine to sit: Mod assist     General bed mobility comments: mod a to scoot up and slide to the edge of bed. Supine B/P 122/44, Sitting edge of the bed 108/45 with mild reported syncope   Transfers Overall transfer level: Needs assistance Equipment used: Rolling walker (2 wheeled) Transfers: Sit to/from Stand Sit to Stand: Mod assist         General transfer comment: mod a to stand. Once stance mod a to remain standing therapy tried to have paitnet loosen her grip on the walker to take her B/P but the patient was unable to B/P standing at 81/51 with moderate syncope noted. Patient put back to bed.   Ambulation/Gait                Stairs            Wheelchair Mobility    Modified Rankin (Stroke Patients Only)       Balance Overall  balance assessment: Needs assistance Sitting-balance support: Single extremity supported;Feet supported Sitting balance-Leahy Scale: Fair Sitting balance - Comments: gaurding required eob    Standing balance support: Bilateral upper extremity supported Standing balance-Leahy Scale: Poor                               Pertinent Vitals/Pain Pain Assessment: No/denies pain    Home Living Family/patient expects to be discharged to:: Skilled nursing facility Living Arrangements: Alone Available Help at Discharge: Family Type of Home: House           Additional Comments: unclear whether she has steps or what equipment she has     Prior Function Level of Independence: Independent with assistive device(s)         Comments: questionable past mobility. Patient confused. She states "well I walk just like you do"      Hand Dominance        Extremity/Trunk Assessment   Upper Extremity Assessment Upper Extremity Assessment: Generalized weakness    Lower Extremity Assessment Lower Extremity Assessment: Generalized weakness    Cervical / Trunk Assessment Cervical / Trunk Assessment: Normal  Communication   Communication: No difficulties  Cognition Arousal/Alertness: Lethargic  General Comments: Patient frequently fell back to sleep. She asked therapist " how did you get in here" upon entrance. She was Aox0. She did not recall being at the hospital.       General Comments General comments (skin integrity, edema, etc.): nursing given orthostatic vitals     Exercises     Assessment/Plan    PT Assessment Patient needs continued PT services  PT Problem List Decreased strength;Decreased range of motion;Decreased activity tolerance;Decreased mobility;Decreased coordination;Decreased knowledge of use of DME;Decreased safety awareness;Decreased knowledge of precautions       PT Treatment Interventions DME  instruction;Gait training;Functional mobility training;Therapeutic activities;Therapeutic exercise;Neuromuscular re-education;Patient/family education    PT Goals (Current goals can be found in the Care Plan section)  Acute Rehab PT Goals Patient Stated Goal: patient unable to participate in goals  PT Goal Formulation: With patient Time For Goal Achievement: 05/06/19 Potential to Achieve Goals: Fair    Frequency Min 3X/week   Barriers to discharge Decreased caregiver support lives alone at home     Co-evaluation               AM-PAC PT "6 Clicks" Mobility  Outcome Measure Help needed turning from your back to your side while in a flat bed without using bedrails?: A Little Help needed moving from lying on your back to sitting on the side of a flat bed without using bedrails?: A Lot Help needed moving to and from a bed to a chair (including a wheelchair)?: A Lot Help needed standing up from a chair using your arms (e.g., wheelchair or bedside chair)?: Total Help needed to walk in hospital room?: Total   6 Click Score: 9    End of Session Equipment Utilized During Treatment: Gait belt Activity Tolerance: Patient limited by fatigue Patient left: in bed;with call bell/phone within reach;with bed alarm set Nurse Communication: Mobility status PT Visit Diagnosis: Unsteadiness on feet (R26.81);Other abnormalities of gait and mobility (R26.89);Muscle weakness (generalized) (M62.81)    Time: 0321-2248 PT Time Calculation (min) (ACUTE ONLY): 23 min   Charges:   PT Evaluation $PT Eval Moderate Complexity: 1 Mod            Carney Living PT DPT  04/29/2019, 4:21 PM

## 2019-04-29 NOTE — Progress Notes (Signed)
[facility]                                                                              Patient Demographics  Patricia Campbell, is a 85 y.o. female, DOB - 26-Jun-1927, VOJ:[id number]  Admit date - 04/28/2019   Admitting Physician Shela Leff, MD  Outpatient Primary MD for the patient is Rutherford Guys, MD  Outpatient specialists:   LOS - 0  days   Medical records reviewed and are as summarized below:    No chief complaint on file.      Brief summary   Patient is a 85 year old female with history of thyroid CA status post thyroidectomy, degenerative disc disease, osteoporosis presented to ED with altered mental status, syncope.  Per EMS, patient lives alone at home and had a fall witnessed by family.  No loss of consciousness.  Per EMS, patient was hypotensive and 500 cc bolus was given.  In ED, BP elevated systolic ranging from 993Z to 210, lactic acid 2.1, afebrile, tachycardia, ammonia level normal.  UA with positive UTI.  Chest x-ray showed no active disease.  Patient was admitted for further work-up. COVID-19 test in process  Assessment & Plan    Principal Problem: Urinary tract infection -Continue IV Rocephin, follow urine culture and sensitivities  Active problems Acute metabolic encephalopathy -Head CT with no acute findings, showed atrophy with chronic microvascular disease, likely has some underlying dementia.  No focal neurological deficits. -Mental status likely worse due to UTI. At baseline, per sister, she has been lot more confused these days, has not been taking her medications, forgets to eat.  Lives alone but her niece checks on her.  Patient's sister had been thinking about skilled nursing facility/nursing home but had been apprehensive because of COVID-19 pandemic. -May benefit from Remeron at night, will try low-dose  Fall, syncope -Elevated BP in ED. -Troponins positive, trending down, no chest pain or shortness of breath.  No seizure  activity noted by EMS -Obtain 2D echocardiogram, orthostatic vitals. -PT evaluation  Elevated troponins -Likely due to hypertensive urgency, demand ischemia, no chest pain or shortness of breath on exam -trop trending down, follow 2D echo,   Hypertensive urgency BP still elevated, placed on atenolol 25 mg daily  Elevated LFTs -Mildly elevated AST, right upper quadrant ultrasound showed normal gallbladder with no cholelithiasis, no intrahepatic biliary ductal dilatation  History of thyroidectomy, on thyroid hormone replacement therapy -Continue home Synthroid -Free T4 mildly elevated 1.3, TSH 16.0, outpatient follow-up with PCP   Code Status: DNR DNI, discussed with patient's sister in detail.  Ms. Kimberlee Nearing states that she had  discussed the advance care directives with the patient in the past and she always wanted DNR/DNI. DVT Prophylaxis:  Lovenox  Family Communication: Discussed in detail with the patient, all imaging results, lab results explained to the patient's sister   Disposition Plan: Patient may need skilled nursing facility/nursing home or 24/7 supervision at home  Time Spent in minutes 35 minutes  Procedures:  None  Consultants:   None  Antimicrobials:   Anti-infectives (From admission, onward)   Start     Dose/Rate Route Frequency Ordered  Stop   04/29/19 1830  cefTRIAXone (ROCEPHIN) 1 g in sodium chloride 0.9 % 100 mL IVPB     1 g 200 mL/hr over 30 Minutes Intravenous Every 24 hours 04/28/19 2105     04/28/19 1730  cefTRIAXone (ROCEPHIN) 1 g in sodium chloride 0.9 % 100 mL IVPB     1 g 200 mL/hr over 30 Minutes Intravenous  Once 04/28/19 1726 04/28/19 1824          Medications  Scheduled Meds:  enoxaparin (LOVENOX) injection  30 mg Subcutaneous Q24H   levothyroxine  75 mcg Oral QAC breakfast   Continuous Infusions:  cefTRIAXone (ROCEPHIN)  IV     PRN Meds:.acetaminophen **OR** acetaminophen, hydrALAZINE      Subjective:   Mollyann  Vester was seen and examined today.  Confused, oriented to self.  States that "she does not know where she is but all these doctors come and go".  Difficult to obtain review of system from the patient.  She denies any pain anywhere.  BP still elevated.   Objective:   Vitals:   04/28/19 2130 04/28/19 2158 04/29/19 0543 04/29/19 1207  BP: 138/67 (!) 160/74 (!) 113/52 (!) 131/49  Pulse: 100 100 (!) 101 (!) 105  Resp: 18 18 18 20   Temp:   97.7 F (36.5 C) 98.1 F (36.7 C)  TempSrc:    Oral  SpO2: 97% 97% 99% 96%  Weight:      Height:        Intake/Output Summary (Last 24 hours) at 04/29/2019 1315 Last data filed at 04/28/2019 1824 Gross per 24 hour  Intake 100 ml  Output --  Net 100 ml     Wt Readings from Last 3 Encounters:  04/28/19 40.8 kg  11/11/18 42.6 kg  04/15/18 42.2 kg     Exam  General: Alert and oriented x self, confused  Eyes: PERRLA, EOMI, Anicteric Sclera,  HEENT:  Atraumatic, normocephalic, normal oropharynx  Cardiovascular: S1 S2 auscultated, Regular rate and rhythm.  Respiratory: Clear to auscultation bilaterally, no wheezing, rales or rhonchi  Gastrointestinal: Soft, nontender, nondistended, + bowel sounds  Ext: no pedal edema bilaterally  Neuro: Strength 5/5 in upper and lower extremities bilaterally  Musculoskeletal: No digital cyanosis, clubbing  Skin: No rashes  Psych: Confused    Data Reviewed:  I have personally reviewed following labs and imaging studies  Micro Results No results found for this or any previous visit (from the past 240 hour(s)).  Radiology Reports Dg Knee 2 Views Left  Result Date: 04/28/2019 CLINICAL DATA:  Fall EXAM: LEFT KNEE - 1-2 VIEW COMPARISON:  03/30/2017 FINDINGS: No fracture or dislocation. Small knee effusion. Mild patellofemoral and medial joint space degenerative change. Moderate severe degenerative change of the lateral joint space with subarticular sclerosis and prominent spurring. IMPRESSION: 1.  Arthritis of the knee without acute osseous abnormality 2. Small knee effusion Electronically Signed   By: Donavan Foil M.D.   On: 04/28/2019 19:07   Dg Abd 1 View  Result Date: 04/28/2019 CLINICAL DATA:  Abdominal pain EXAM: ABDOMEN - 1 VIEW COMPARISON:  09/21/2013 FINDINGS: Severe scoliosis of the spine. Nonobstructed bowel-gas pattern. No radiopaque calculi. IMPRESSION: Nonobstructed bowel-gas pattern. Electronically Signed   By: Donavan Foil M.D.   On: 04/28/2019 21:27   Ct Head Wo Contrast  Result Date: 04/28/2019 CLINICAL DATA:  Status post fall. Syncopal episode. EXAM: CT HEAD WITHOUT CONTRAST CT CERVICAL SPINE WITHOUT CONTRAST TECHNIQUE: Multidetector CT imaging of the head and cervical spine was  performed following the standard protocol without intravenous contrast. Multiplanar CT image reconstructions of the cervical spine were also generated. COMPARISON:  None. FINDINGS: CT HEAD FINDINGS Brain: No evidence of acute infarction, hemorrhage, hydrocephalus, extra-axial collection or mass lesion/mass effect. Moderate brain parenchymal volume loss and deep white matter microangiopathy. Vascular: Calcific atherosclerotic disease at the skull base. Skull: Normal. Negative for fracture or focal lesion. Sinuses/Orbits: Partial opacification of the right mastoid air cells. Mucosal thickening of the bilateral ethmoid sinuses. Other: None. CT CERVICAL SPINE FINDINGS Alignment: C4-C5 2 mm anterolisthesis, likely degenerative. Skull base and vertebrae: No acute fracture. No primary bone lesion or focal pathologic process. Soft tissues and spinal canal: No prevertebral fluid or swelling. No visible canal hematoma. Disc levels:  Multilevel osteoarthritic changes. Upper chest: Biapical scarring. Other: None. IMPRESSION: 1. No acute intracranial abnormality. 2. Atrophy, chronic microvascular disease. 3. No evidence of acute traumatic injury to cervical spine. 4. Multilevel osteoarthritic changes of the cervical  spine. 5. Partial opacification of the right mastoid air cells. Ethmoid sinusitis. Electronically Signed   By: Fidela Salisbury M.D.   On: 04/28/2019 18:16   Ct Cervical Spine Wo Contrast  Result Date: 04/28/2019 CLINICAL DATA:  Status post fall. Syncopal episode. EXAM: CT HEAD WITHOUT CONTRAST CT CERVICAL SPINE WITHOUT CONTRAST TECHNIQUE: Multidetector CT imaging of the head and cervical spine was performed following the standard protocol without intravenous contrast. Multiplanar CT image reconstructions of the cervical spine were also generated. COMPARISON:  None. FINDINGS: CT HEAD FINDINGS Brain: No evidence of acute infarction, hemorrhage, hydrocephalus, extra-axial collection or mass lesion/mass effect. Moderate brain parenchymal volume loss and deep white matter microangiopathy. Vascular: Calcific atherosclerotic disease at the skull base. Skull: Normal. Negative for fracture or focal lesion. Sinuses/Orbits: Partial opacification of the right mastoid air cells. Mucosal thickening of the bilateral ethmoid sinuses. Other: None. CT CERVICAL SPINE FINDINGS Alignment: C4-C5 2 mm anterolisthesis, likely degenerative. Skull base and vertebrae: No acute fracture. No primary bone lesion or focal pathologic process. Soft tissues and spinal canal: No prevertebral fluid or swelling. No visible canal hematoma. Disc levels:  Multilevel osteoarthritic changes. Upper chest: Biapical scarring. Other: None. IMPRESSION: 1. No acute intracranial abnormality. 2. Atrophy, chronic microvascular disease. 3. No evidence of acute traumatic injury to cervical spine. 4. Multilevel osteoarthritic changes of the cervical spine. 5. Partial opacification of the right mastoid air cells. Ethmoid sinusitis. Electronically Signed   By: Fidela Salisbury M.D.   On: 04/28/2019 18:16   Dg Chest Portable 1 View  Result Date: 04/28/2019 CLINICAL DATA:  Altered mental status, fall EXAM: PORTABLE CHEST 1 VIEW COMPARISON:  06/10/2006  FINDINGS: Pleural and parenchymal scarring at the apices. No acute airspace disease or effusion. Stable cardiomediastinal silhouette with aortic atherosclerosis. No pneumothorax. IMPRESSION: No active disease.  Stable scarring at the apices. Electronically Signed   By: Donavan Foil M.D.   On: 04/28/2019 19:06   US Abdomen Limited Ruq  Result Date: 04/29/2019 CLINICAL DATA:  Elevated liver function tests.  Inpatient. EXAM: ULTRASOUND ABDOMEN LIMITED RIGHT UPPER QUADRANT COMPARISON:  None. FINDINGS: Gallbladder: No gallstones or wall thickening visualized. No sonographic Murphy sign noted by sonographer. Common bile duct: Not visualized. Examination limited by patient body habitus. No intrahepatic biliary ductal dilatation appreciated. Liver: No focal lesion identified. Within normal limits in parenchymal echogenicity. Portal vein is patent on color Doppler imaging with normal direction of blood flow towards the liver. IMPRESSION: 1. Normal gallbladder with no cholelithiasis. 2. Common bile duct unable to be visualized. No intrahepatic  biliary ductal dilatation appreciated. If there is clinical concern for biliary obstruction, CT abdomen/pelvis with oral and IV contrast could be obtained for further evaluation. 3. Normal liver. Electronically Signed   By: Ilona Sorrel M.D.   On: 04/29/2019 07:18    Lab Data:  CBC: Recent Labs  Lab 04/28/19 1632  WBC 10.1  NEUTROABS 8.1*  HGB 12.7  HCT 41.0  MCV 94.0  PLT 774   Basic Metabolic Panel: Recent Labs  Lab 04/28/19 1632  NA 140  K 3.7  CL 105  CO2 21*  GLUCOSE 89  BUN 18  CREATININE 1.03*  CALCIUM 8.0*  MG 2.1   GFR: Estimated Creatinine Clearance: 23.4 mL/min (A) (by C-G formula based on SCr of 1.03 mg/dL (H)). Liver Function Tests: Recent Labs  Lab 04/28/19 1632 04/29/19 1006  AST 55* 56*  ALT 24 24  ALKPHOS 58 53  BILITOT 1.3* 1.2  PROT 6.1* 6.1*  ALBUMIN 3.5 3.3*   Recent Labs  Lab 04/28/19 1632  LIPASE 20   Recent  Labs  Lab 04/28/19 1633  AMMONIA 12   Coagulation Profile: No results for input(s): INR, PROTIME in the last 168 hours. Cardiac Enzymes: Recent Labs  Lab 04/28/19 1632 04/28/19 2223 04/29/19 0247 04/29/19 1006  TROPONINI 0.05* 0.29* 0.21* 0.14*   BNP (last 3 results) No results for input(s): PROBNP in the last 8760 hours. HbA1C: No results for input(s): HGBA1C in the last 72 hours. CBG: Recent Labs  Lab 04/28/19 1623 04/29/19 0749  GLUCAP 82 57*   Lipid Profile: No results for input(s): CHOL, HDL, LDLCALC, TRIG, CHOLHDL, LDLDIRECT in the last 72 hours. Thyroid Function Tests: Recent Labs    04/28/19 1632  TSH 16.056*  FREET4 1.37*   Anemia Panel: No results for input(s): VITAMINB12, FOLATE, FERRITIN, TIBC, IRON, RETICCTPCT in the last 72 hours. Urine analysis:    Component Value Date/Time   COLORURINE YELLOW 04/28/2019 1632   APPEARANCEUR CLOUDY (A) 04/28/2019 1632   LABSPEC 1.011 04/28/2019 1632   PHURINE 6.0 04/28/2019 1632   GLUCOSEU NEGATIVE 04/28/2019 1632   HGBUR SMALL (A) 04/28/2019 1632   BILIRUBINUR NEGATIVE 04/28/2019 1632   BILIRUBINUR negative 03/30/2017 1641   BILIRUBINUR NEG 07/22/2015 1403   KETONESUR 20 (A) 04/28/2019 1632   PROTEINUR 30 (A) 04/28/2019 1632   UROBILINOGEN 0.2 03/30/2017 1641   NITRITE NEGATIVE 04/28/2019 1632   LEUKOCYTESUR LARGE (A) 04/28/2019 1632     Katlyne Nishida M.D. [facility] 04/29/2019, 1:15 PM  Pager: [phone] Between 7am to 7pm - call Pager - [phone]  After 7pm go to [email or web address] - password [email or web address]  Call night coverage person covering after 7pm

## 2019-04-29 NOTE — Progress Notes (Signed)
Patient has troponin of 0.21.  Notified Jeannette Corpus, NP.  Will continue to monitor the patient and notify as needed

## 2019-04-30 LAB — URINE CULTURE: Culture: >=100000 — AB

## 2019-04-30 LAB — BASIC METABOLIC PANEL
Anion gap: 8 (ref 5–15)
BUN: 31 mg/dL — ABNORMAL HIGH (ref 8–23)
CO2: 24 mmol/L (ref 22–32)
Calcium: 8.1 mg/dL — ABNORMAL LOW (ref 8.9–10.3)
Chloride: 106 mmol/L (ref 98–111)
Creatinine, Ser: 0.95 mg/dL (ref 0.44–1.00)
GFR calc Af Amer: >60 mL/min (ref >60)
GFR calc non Af Amer: 53 mL/min — ABNORMAL LOW (ref >60)
Glucose, Bld: 88 mg/dL (ref 70–99)
Potassium: 3.6 mmol/L (ref 3.5–5.1)
Sodium: 138 mmol/L (ref 135–145)

## 2019-04-30 LAB — NOVEL CORONAVIRUS, NAA (HOSP ORDER, SEND-OUT TO REF LAB; TAT 18-24 HRS): SARS-CoV-2, NAA: NOT DETECTED

## 2019-04-30 NOTE — Progress Notes (Signed)
[facility]                                                                              Patient Demographics  Patricia Campbell, is a 85 y.o. female, DOB - [birth year], ION:[id number]  Admit date - 04/28/2019   Admitting Physician Patricia Leff, MD  Outpatient Primary MD for the patient is Patricia Guys, MD  Outpatient specialists:   LOS - 1  days   Medical records reviewed and are as summarized below:    No chief complaint on file.      Brief summary   Patient is a 85 year old female with history of thyroid CA status post thyroidectomy, degenerative disc disease, osteoporosis presented to ED with altered mental status, syncope.  Per EMS, patient lives alone at home and had a fall witnessed by family.  No loss of consciousness.  Per EMS, patient was hypotensive and 500 cc bolus was given.  In ED, BP elevated systolic ranging from 244W to 210, lactic acid 2.1, afebrile, tachycardia, ammonia level normal.  UA with positive UTI.  Chest x-ray showed no active disease.  Patient was admitted for further work-up. COVID-19 test in process  Assessment & Plan    Principal Problem: E. coli urinary tract infection -Urine culture showed more than 100,000 colonies of E. Coli -Continue IV Rocephin  Active problems Acute metabolic encephalopathy superimposed on dementia -Head CT with no acute findings, showed atrophy with chronic microvascular disease, likely has some underlying dementia.  No focal neurological deficits. -Mental status improving, she is much more alert and oriented today.  However at baseline she has been lot more confused and has underlying dementia, confirmed with her sister on 6/20.  -Continue low-dose Remeron.  Fall, syncope  -Patient presented with elevated BP, no seizure activity.  No chest pain or shortness of breath.  Troponins positive -Awaiting 2D echo, PT evaluation  Elevated troponins -Likely due to hypertensive urgency, demand ischemia, no  chest pain or shortness of breath on exam -Follow 2D echo  Hypertensive urgency BP improving, continue atenolol.  Elevated LFTs -Mildly elevated AST, right upper quadrant ultrasound showed normal gallbladder with no cholelithiasis, no intrahepatic biliary ductal dilatation  History of thyroidectomy, on thyroid hormone replacement therapy -Continue home Synthroid -Free T4 mildly elevated 1.3, TSH 16.0, outpatient follow-up with PCP   Code Status: DNR DNI, discussed with patient's sister on 6/20  DVT Prophylaxis:  Lovenox  Family Communication: Discussed in detail with the patient, all imaging results, lab results explained to the patient's sister on 6/20   Disposition Plan: Lives alone, per patient sister, not safe at home with memory issues.  Requested skilled nursing facility.   Time Spent in minutes 25 minutes  Procedures:  None  Consultants:   None  Antimicrobials:   Anti-infectives (From admission, onward)   Start     Dose/Rate Route Frequency Ordered Stop   04/29/19 1830  cefTRIAXone (ROCEPHIN) 1 g in sodium chloride 0.9 % 100 mL IVPB     1 g 200 mL/hr over 30 Minutes Intravenous Every 24 hours 04/28/19 2105     04/28/19 1730  cefTRIAXone (ROCEPHIN) 1 g in sodium  chloride 0.9 % 100 mL IVPB     1 g 200 mL/hr over 30 Minutes Intravenous  Once 04/28/19 1726 04/28/19 1824         Medications  Scheduled Meds:  aspirin EC  81 mg Oral Daily   atenolol  25 mg Oral Daily   enoxaparin (LOVENOX) injection  30 mg Subcutaneous Q24H   levothyroxine  75 mcg Oral QAC breakfast   mirtazapine  7.5 mg Oral QHS   Continuous Infusions:  cefTRIAXone (ROCEPHIN)  IV Stopped (04/29/19 2049)   PRN Meds:.acetaminophen **OR** acetaminophen, hydrALAZINE      Subjective:   Patricia Campbell was seen and examined today.  Much more alert and awake, knows she is in the hospital, oriented to self.  BP improving.  No nausea vomiting, abdominal pain, chest pain or shortness of  breath.  No fevers.  Objective:   Vitals:   04/29/19 1207 04/29/19 2124 04/30/19 0553 04/30/19 1314  BP: (!) 131/49 (!) 113/51 (!) 151/78 (!) 121/52  Pulse: (!) 105 72 74 66  Resp: 20   20  Temp: 98.1 F (36.7 C) 98.4 F (36.9 C) 97.6 F (36.4 C) 98.3 F (36.8 C)  TempSrc: Oral Oral Oral   SpO2: 96% 97% 100% 95%  Weight:      Height:        Intake/Output Summary (Last 24 hours) at 04/30/2019 1521 Last data filed at 04/30/2019 0304 Gross per 24 hour  Intake 100 ml  Output 350 ml  Net -250 ml     Wt Readings from Last 3 Encounters:  04/28/19 40.8 kg  11/11/18 42.6 kg  04/15/18 42.2 kg   Physical Exam  General: Much more alert and oriented today to self and place, conversing  Eyes: P  HEENT:  Atraumatic, normocephalic  Cardiovascular: S1 S2 clear, RRR. No pedal edema b/l  Respiratory: CTAB, no wheezing, rales or rhonchi  Gastrointestinal: Soft, nontender, nondistended, NBS  Ext: no pedal edema bilaterally  Neuro: no new deficits  Musculoskeletal: No cyanosis, clubbing  Skin: No rashes  Psych: Still somewhat confused although much better from yesterday      Data Reviewed:  I have personally reviewed following labs and imaging studies  Micro Results Recent Results (from the past 240 hour(s))  Urine culture     Status: Abnormal   Collection Time: 04/28/19  4:33 PM   Specimen: Urine, Random  Result Value Ref Range Status   Specimen Description URINE, RANDOM  Final   Special Requests   Final    NONE Performed at [facility], [address], [place], [place] [address]    Culture >=100,000 COLONIES/mL ESCHERICHIA COLI (A)  Final   Report Status 04/30/2019 FINAL  Final   Organism ID, Bacteria ESCHERICHIA COLI (A)  Final      Susceptibility   Escherichia coli - MIC*    AMPICILLIN >=32 RESISTANT Resistant     CEFAZOLIN <=4 SENSITIVE Sensitive     CEFTRIAXONE <=1 SENSITIVE Sensitive     CIPROFLOXACIN <=0.25 SENSITIVE Sensitive     GENTAMICIN  >=16 RESISTANT Resistant     IMIPENEM 0.5 SENSITIVE Sensitive     NITROFURANTOIN <=16 SENSITIVE Sensitive     TRIMETH/SULFA >=320 RESISTANT Resistant     AMPICILLIN/SULBACTAM 8 SENSITIVE Sensitive     PIP/TAZO <=4 SENSITIVE Sensitive     Extended ESBL NEGATIVE Sensitive     * >=100,000 COLONIES/mL ESCHERICHIA COLI  Novel Coronavirus,NAA,(SEND-OUT TO REF LAB - TAT 24-48 hrs); Hosp Order  Status: None   Collection Time: 04/28/19  6:50 PM   Specimen: Nasopharyngeal Swab; Respiratory  Result Value Ref Range Status   SARS-CoV-2, NAA NOT DETECTED NOT DETECTED Final    Comment: (NOTE) This test was developed and its performance characteristics determined by [employer]. This test has not been FDA cleared or approved. This test has been authorized by FDA under an Emergency Use Authorization (EUA). This test is only authorized for the duration of time the declaration that circumstances exist justifying the authorization of the emergency use of in vitro diagnostic tests for detection of SARS-CoV-2 virus and/or diagnosis of COVID-19 infection under section 564(b)(1) of the Act, 21 U.S.C. 237SEG-3(T)(5), unless the authorization is terminated or revoked sooner. When diagnostic testing is negative, the possibility of a false negative result should be considered in the context of a patient's recent exposures and the presence of clinical signs and symptoms consistent with COVID-19. An individual without symptoms of COVID-19 and who is not shedding SARS-CoV-2 virus would expect to have a negative (not detected) result in this assay. Performed  At: [facility] [address] [place], [place] [id number] Rush Farmer MD TG:[id number]    [place]  Final    Comment: Performed at [facility], [place] [address], [place], [place] [address]    Radiology Reports Dg Knee 2 Views Left  Result Date: 04/28/2019 CLINICAL DATA:  Fall EXAM: LEFT KNEE - 1-2  VIEW COMPARISON:  03/30/2017 FINDINGS: No fracture or dislocation. Small knee effusion. Mild patellofemoral and medial joint space degenerative change. Moderate severe degenerative change of the lateral joint space with subarticular sclerosis and prominent spurring. IMPRESSION: 1. Arthritis of the knee without acute osseous abnormality 2. Small knee effusion Electronically Signed   By: Donavan Foil M.D.   On: 04/28/2019 19:07   Dg Abd 1 View  Result Date: 04/28/2019 CLINICAL DATA:  Abdominal pain EXAM: ABDOMEN - 1 VIEW COMPARISON:  09/21/2013 FINDINGS: Severe scoliosis of the spine. Nonobstructed bowel-gas pattern. No radiopaque calculi. IMPRESSION: Nonobstructed bowel-gas pattern. Electronically Signed   By: Donavan Foil M.D.   On: 04/28/2019 21:27   Ct Head Wo Contrast  Result Date: 04/28/2019 CLINICAL DATA:  Status post fall. Syncopal episode. EXAM: CT HEAD WITHOUT CONTRAST CT CERVICAL SPINE WITHOUT CONTRAST TECHNIQUE: Multidetector CT imaging of the head and cervical spine was performed following the standard protocol without intravenous contrast. Multiplanar CT image reconstructions of the cervical spine were also generated. COMPARISON:  None. FINDINGS: CT HEAD FINDINGS Brain: No evidence of acute infarction, hemorrhage, hydrocephalus, extra-axial collection or mass lesion/mass effect. Moderate brain parenchymal volume loss and deep white matter microangiopathy. Vascular: Calcific atherosclerotic disease at the skull base. Skull: Normal. Negative for fracture or focal lesion. Sinuses/Orbits: Partial opacification of the right mastoid air cells. Mucosal thickening of the bilateral ethmoid sinuses. Other: None. CT CERVICAL SPINE FINDINGS Alignment: C4-C5 2 mm anterolisthesis, likely degenerative. Skull base and vertebrae: No acute fracture. No primary bone lesion or focal pathologic process. Soft tissues and spinal canal: No prevertebral fluid or swelling. No visible canal hematoma. Disc levels:   Multilevel osteoarthritic changes. Upper chest: Biapical scarring. Other: None. IMPRESSION: 1. No acute intracranial abnormality. 2. Atrophy, chronic microvascular disease. 3. No evidence of acute traumatic injury to cervical spine. 4. Multilevel osteoarthritic changes of the cervical spine. 5. Partial opacification of the right mastoid air cells. Ethmoid sinusitis. Electronically Signed   By: Fidela Salisbury M.D.   On: 04/28/2019 18:16   Ct Cervical Spine Wo  Contrast  Result Date: 04/28/2019 CLINICAL DATA:  Status post fall. Syncopal episode. EXAM: CT HEAD WITHOUT CONTRAST CT CERVICAL SPINE WITHOUT CONTRAST TECHNIQUE: Multidetector CT imaging of the head and cervical spine was performed following the standard protocol without intravenous contrast. Multiplanar CT image reconstructions of the cervical spine were also generated. COMPARISON:  None. FINDINGS: CT HEAD FINDINGS Brain: No evidence of acute infarction, hemorrhage, hydrocephalus, extra-axial collection or mass lesion/mass effect. Moderate brain parenchymal volume loss and deep white matter microangiopathy. Vascular: Calcific atherosclerotic disease at the skull base. Skull: Normal. Negative for fracture or focal lesion. Sinuses/Orbits: Partial opacification of the right mastoid air cells. Mucosal thickening of the bilateral ethmoid sinuses. Other: None. CT CERVICAL SPINE FINDINGS Alignment: C4-C5 2 mm anterolisthesis, likely degenerative. Skull base and vertebrae: No acute fracture. No primary bone lesion or focal pathologic process. Soft tissues and spinal canal: No prevertebral fluid or swelling. No visible canal hematoma. Disc levels:  Multilevel osteoarthritic changes. Upper chest: Biapical scarring. Other: None. IMPRESSION: 1. No acute intracranial abnormality. 2. Atrophy, chronic microvascular disease. 3. No evidence of acute traumatic injury to cervical spine. 4. Multilevel osteoarthritic changes of the cervical spine. 5. Partial opacification  of the right mastoid air cells. Ethmoid sinusitis. Electronically Signed   By: Fidela Salisbury M.D.   On: 04/28/2019 18:16   Dg Chest Portable 1 View  Result Date: 04/28/2019 CLINICAL DATA:  Altered mental status, fall EXAM: PORTABLE CHEST 1 VIEW COMPARISON:  06/10/2006 FINDINGS: Pleural and parenchymal scarring at the apices. No acute airspace disease or effusion. Stable cardiomediastinal silhouette with aortic atherosclerosis. No pneumothorax. IMPRESSION: No active disease.  Stable scarring at the apices. Electronically Signed   By: Donavan Foil M.D.   On: 04/28/2019 19:06   US Abdomen Limited Ruq  Result Date: 04/29/2019 CLINICAL DATA:  Elevated liver function tests.  Inpatient. EXAM: ULTRASOUND ABDOMEN LIMITED RIGHT UPPER QUADRANT COMPARISON:  None. FINDINGS: Gallbladder: No gallstones or wall thickening visualized. No sonographic Murphy sign noted by sonographer. Common bile duct: Not visualized. Examination limited by patient body habitus. No intrahepatic biliary ductal dilatation appreciated. Liver: No focal lesion identified. Within normal limits in parenchymal echogenicity. Portal vein is patent on color Doppler imaging with normal direction of blood flow towards the liver. IMPRESSION: 1. Normal gallbladder with no cholelithiasis. 2. Common bile duct unable to be visualized. No intrahepatic biliary ductal dilatation appreciated. If there is clinical concern for biliary obstruction, CT abdomen/pelvis with oral and IV contrast could be obtained for further evaluation. 3. Normal liver. Electronically Signed   By: Ilona Sorrel M.D.   On: 04/29/2019 07:18    Lab Data:  CBC: Recent Labs  Lab 04/28/19 1632  WBC 10.1  NEUTROABS 8.1*  HGB 12.7  HCT 41.0  MCV 94.0  PLT 030   Basic Metabolic Panel: Recent Labs  Lab 04/28/19 1632 04/30/19 0717  NA 140 138  K 3.7 3.6  CL 105 106  CO2 21* 24  GLUCOSE 89 88  BUN 18 31*  CREATININE 1.03* 0.95  CALCIUM 8.0* 8.1*  MG 2.1  --     GFR: Estimated Creatinine Clearance: 25.4 mL/min (by C-G formula based on SCr of 0.95 mg/dL). Liver Function Tests: Recent Labs  Lab 04/28/19 1632 04/29/19 1006  AST 55* 56*  ALT 24 24  ALKPHOS 58 53  BILITOT 1.3* 1.2  PROT 6.1* 6.1*  ALBUMIN 3.5 3.3*   Recent Labs  Lab 04/28/19 1632  LIPASE 20   Recent Labs  Lab 04/28/19 1633  AMMONIA 12   Coagulation Profile: No results for input(s): INR, PROTIME in the last 168 hours. Cardiac Enzymes: Recent Labs  Lab 04/28/19 1632 04/28/19 2223 04/29/19 0247 04/29/19 1006  TROPONINI 0.05* 0.29* 0.21* 0.14*   BNP (last 3 results) No results for input(s): PROBNP in the last 8760 hours. HbA1C: No results for input(s): HGBA1C in the last 72 hours. CBG: Recent Labs  Lab 04/28/19 1623 04/29/19 0749  GLUCAP 82 57*   Lipid Profile: No results for input(s): CHOL, HDL, LDLCALC, TRIG, CHOLHDL, LDLDIRECT in the last 72 hours. Thyroid Function Tests: Recent Labs    04/28/19 1632  TSH 16.056*  FREET4 1.37*   Anemia Panel: No results for input(s): VITAMINB12, FOLATE, FERRITIN, TIBC, IRON, RETICCTPCT in the last 72 hours. Urine analysis:    Component Value Date/Time   COLORURINE YELLOW 04/28/2019 1632   APPEARANCEUR CLOUDY (A) 04/28/2019 1632   LABSPEC 1.011 04/28/2019 1632   PHURINE 6.0 04/28/2019 1632   GLUCOSEU NEGATIVE 04/28/2019 1632   HGBUR SMALL (A) 04/28/2019 1632   BILIRUBINUR NEGATIVE 04/28/2019 1632   BILIRUBINUR negative 03/30/2017 1641   BILIRUBINUR NEG 07/22/2015 1403   KETONESUR 20 (A) 04/28/2019 1632   PROTEINUR 30 (A) 04/28/2019 1632   UROBILINOGEN 0.2 03/30/2017 1641   NITRITE NEGATIVE 04/28/2019 1632   LEUKOCYTESUR LARGE (A) 04/28/2019 1632     Lurlie Wigen M.D. [facility] 04/30/2019, 3:21 PM  Pager: [phone] Between 7am to 7pm - call Pager - [phone]  After 7pm go to [email or web address] - password [email or web address]  Call night coverage person covering after 7pm

## 2019-04-30 NOTE — Progress Notes (Signed)
Updated patient's sister, [name].

## 2019-05-01 ENCOUNTER — Other Ambulatory Visit (HOSPITAL_COMMUNITY): Payer: Medicare HMO

## 2019-05-01 ENCOUNTER — Inpatient Hospital Stay (HOSPITAL_COMMUNITY): Payer: Medicare HMO

## 2019-05-01 DIAGNOSIS — R55 Syncope and collapse: Secondary | ICD-10-CM

## 2019-05-01 LAB — BASIC METABOLIC PANEL
Anion gap: 8 (ref 5–15)
BUN: 36 mg/dL — ABNORMAL HIGH (ref 8–23)
CO2: 23 mmol/L (ref 22–32)
Calcium: 8.2 mg/dL — ABNORMAL LOW (ref 8.9–10.3)
Chloride: 108 mmol/L (ref 98–111)
Creatinine, Ser: 0.92 mg/dL (ref 0.44–1.00)
GFR calc Af Amer: >60 mL/min (ref >60)
GFR calc non Af Amer: 55 mL/min — ABNORMAL LOW (ref >60)
Glucose, Bld: 94 mg/dL (ref 70–99)
Potassium: 4 mmol/L (ref 3.5–5.1)
Sodium: 139 mmol/L (ref 135–145)

## 2019-05-01 LAB — ECHOCARDIOGRAM COMPLETE
Height: 61 in
Weight: 1440 oz

## 2019-05-01 MED ORDER — CEFAZOLIN SODIUM-DEXTROSE 1-4 GM/50ML-% IV SOLN
1.0000 g | Freq: Two times a day (BID) | INTRAVENOUS | Status: DC
  Filled 2019-05-01 (×2): qty 50

## 2019-05-01 MED ORDER — CEPHALEXIN 500 MG PO CAPS
500.0000 mg | ORAL_CAPSULE | Freq: Two times a day (BID) | ORAL | Status: DC
  Administered 2019-05-01 – 2019-05-10 (×20): 500 mg via ORAL
  Filled 2019-05-01 (×21): qty 1

## 2019-05-01 NOTE — Progress Notes (Signed)
Spoke to patient's sister.  Updated her on plan of care. Answered all questions to satisfaction. Will continue to update as needed.

## 2019-05-01 NOTE — Progress Notes (Addendum)
VAST consulted to place PIV in confused patient who has had 7 IV's in 3 days (reportedly keeps pulling them out).  Called pharmacy and inquired if Ancef can be given IM. Pharmacist stated it could.  Attempted to call unit to ask unit RN to have dosing changed to IM, however, no one answered phone. VAST consult to follow-up.  75 Spoke with unit RN who stated pt's medication had been changed to PO Keflex. Pt does not currently need IV access.

## 2019-05-01 NOTE — Progress Notes (Signed)
[facility]                                                                              Patient Demographics  Patricia Campbell, is a 85 y.o. female, DOB - 1927-07-02, WLN:[id number]  Admit date - 04/28/2019   Admitting Physician Patricia Leff, MD  Outpatient Primary MD for the patient is Patricia Guys, MD  Outpatient specialists:   LOS - 2  days   Medical records reviewed and are as summarized below:    No chief complaint on file.      Brief summary   Patient is a 85 year old female with history of thyroid CA status post thyroidectomy, degenerative disc disease, osteoporosis presented to ED with altered mental status, syncope.  Per EMS, patient lives alone at home and had a fall witnessed by family.  No loss of consciousness.  Per EMS, patient was hypotensive and 500 cc bolus was given.  In ED, BP elevated systolic ranging from 740C to 210, lactic acid 2.1, afebrile, tachycardia, ammonia level normal.  UA with positive UTI.  Chest x-ray showed no active disease.  Patient was admitted for further work-up. COVID-19 test in process  Assessment & Plan    Principal Problem: E. coli urinary tract infection -Urine culture showed more than 100,000 colonies of E. Coli -Per sensitivities, sensitive to cephalosporins.  Will change to oral Keflex as patient keeps pulling IVs out due to dementia  Active problems Acute metabolic encephalopathy superimposed on dementia -Head CT with no acute findings, showed atrophy with chronic microvascular disease, likely has some underlying dementia.  No focal neurological deficits. -She still has waxing and waning mental status with sundowning.  Per sister, she is confused most of the time due to dementia.   -Continue low-dose Remeron.  Fall, syncope  -Patient presented with elevated BP, no seizure activity.  No chest pain or shortness of breath.  Troponins positive -PT evaluation recommended skilled nursing facility -2D echo  done, results pending  Elevated troponins -Likely due to hypertensive urgency, demand ischemia, no chest pain or shortness of breath on exam -2D echo done today, results pending  Hypertensive urgency BP better, continue atenolol.  Some elevated readings due to agitation.  Elevated LFTs -Mildly elevated AST, right upper quadrant ultrasound showed normal gallbladder with no cholelithiasis, no intrahepatic biliary ductal dilatation  History of thyroidectomy, on thyroid hormone replacement therapy -Continue home Synthroid -Free T4 mildly elevated 1.3, TSH 16.0, outpatient follow-up with PCP   Code Status: DNR DNI, discussed with patient's sister on 6/20  DVT Prophylaxis:  Lovenox  Family Communication: Discussed in detail with the patient, all imaging results, lab results explained to the patient's sister on 6/20   Disposition Plan: Lives alone, per patient sister, not safe at home with memory issues.  Social work consulted for skilled nursing facility  Time Spent in minutes 25 minutes  Procedures:  None  Consultants:   None  Antimicrobials:   Anti-infectives (From admission, onward)   Start     Dose/Rate Route Frequency Ordered Stop   05/01/19 1100  ceFAZolin (ANCEF) IVPB 1 g/50 mL premix     1 g 100  mL/hr over 30 Minutes Intravenous Every 12 hours 05/01/19 1053     04/29/19 1830  cefTRIAXone (ROCEPHIN) 1 g in sodium chloride 0.9 % 100 mL IVPB  Status:  Discontinued     1 g 200 mL/hr over 30 Minutes Intravenous Every 24 hours 04/28/19 2105 05/01/19 1052   04/28/19 1730  cefTRIAXone (ROCEPHIN) 1 g in sodium chloride 0.9 % 100 mL IVPB     1 g 200 mL/hr over 30 Minutes Intravenous  Once 04/28/19 1726 04/28/19 1824         Medications  Scheduled Meds: . aspirin EC  81 mg Oral Daily  . atenolol  25 mg Oral Daily  . enoxaparin (LOVENOX) injection  30 mg Subcutaneous Q24H  . levothyroxine  75 mcg Oral QAC breakfast  . mirtazapine  7.5 mg Oral QHS   Continuous  Infusions: .  ceFAZolin (ANCEF) IV     PRN Meds:.acetaminophen **OR** acetaminophen, hydrALAZINE      Subjective:   Patricia Campbell was seen and examined today.  Alert and awake however still somewhat confused.  Oriented to self.  Pulling out IVs.  No nausea vomiting fever chills, abdominal pain.  No acute issues overnight.  No fevers.   Objective:   Vitals:   04/30/19 0553 04/30/19 1314 04/30/19 2300 05/01/19 0601  BP: (!) 151/78 (!) 121/52 (!) 128/50 (!) 144/69  Pulse: 74 66 74 65  Resp:  20    Temp: 97.6 F (36.4 C) 98.3 F (36.8 C) 98.2 F (36.8 C) 97.7 F (36.5 C)  TempSrc: Oral  Oral Oral  SpO2: 100% 95% 95% 96%  Weight:      Height:        Intake/Output Summary (Last 24 hours) at 05/01/2019 1235 Last data filed at 05/01/2019 0845 Gross per 24 hour  Intake 443.27 ml  Output 500 ml  Net -56.73 ml     Wt Readings from Last 3 Encounters:  04/28/19 40.8 kg  11/11/18 42.6 kg  04/15/18 42.2 kg   Physical Exam  General: Alert and oriented x1, oriented to self, NAD  Eyes:   HEENT:  Atraumatic, normocephalic  Cardiovascular: S1 S2 clear,  RRR. No pedal edema b/l  Respiratory: CTAB, no wheezing, rales or rhonchi  Gastrointestinal: Soft, nontender, nondistended, NBS  Ext: no pedal edema bilaterally  Neuro: no new deficits  Musculoskeletal: No cyanosis, clubbing  Skin: No rashes  Psych: Confused     Data Reviewed:  I have personally reviewed following labs and imaging studies  Micro Results Recent Results (from the past 240 hour(s))  Urine culture     Status: Abnormal   Collection Time: 04/28/19  4:33 PM   Specimen: Urine, Random  Result Value Ref Range Status   Specimen Description URINE, RANDOM  Final   Special Requests   Final    NONE Performed at [facility], [address], [place], [place] [address]    Culture >=100,000 COLONIES/mL ESCHERICHIA COLI (A)  Final   Report Status 04/30/2019 FINAL  Final   Organism ID, Bacteria  ESCHERICHIA COLI (A)  Final      Susceptibility   Escherichia coli - MIC*    AMPICILLIN >=32 RESISTANT Resistant     CEFAZOLIN <=4 SENSITIVE Sensitive     CEFTRIAXONE <=1 SENSITIVE Sensitive     CIPROFLOXACIN <=0.25 SENSITIVE Sensitive     GENTAMICIN >=16 RESISTANT Resistant     IMIPENEM 0.5 SENSITIVE Sensitive     NITROFURANTOIN <=16 SENSITIVE Sensitive  TRIMETH/SULFA >=320 RESISTANT Resistant     AMPICILLIN/SULBACTAM 8 SENSITIVE Sensitive     PIP/TAZO <=4 SENSITIVE Sensitive     Extended ESBL NEGATIVE Sensitive     * >=100,000 COLONIES/mL ESCHERICHIA COLI  Novel Coronavirus,NAA,(SEND-OUT TO REF LAB - TAT 24-48 hrs); Hosp Order     Status: None   Collection Time: 04/28/19  6:50 PM   Specimen: Nasopharyngeal Swab; Respiratory  Result Value Ref Range Status   SARS-CoV-2, NAA NOT DETECTED NOT DETECTED Final    Comment: (NOTE) This test was developed and its performance characteristics determined by [employer]. This test has not been FDA cleared or approved. This test has been authorized by FDA under an Emergency Use Authorization (EUA). This test is only authorized for the duration of time the declaration that circumstances exist justifying the authorization of the emergency use of in vitro diagnostic tests for detection of SARS-CoV-2 virus and/or diagnosis of COVID-19 infection under section 564(b)(1) of the Act, 21 U.S.C. 161WRU-0(A)(5), unless the authorization is terminated or revoked sooner. When diagnostic testing is negative, the possibility of a false negative result should be considered in the context of a patient's recent exposures and the presence of clinical signs and symptoms consistent with COVID-19. An individual without symptoms of COVID-19 and who is not shedding SARS-CoV-2 virus would expect to have a negative (not detected) result in this assay. Performed  At: [facility] [address] [place], [place] [id number] Rush Farmer MD  NW:[id number]    [place]  Final    Comment: Performed at [facility], [place] [address], [place], [place] [address]    Radiology Reports Dg Knee 2 Views Left  Result Date: 04/28/2019 CLINICAL DATA:  Fall EXAM: LEFT KNEE - 1-2 VIEW COMPARISON:  03/30/2017 FINDINGS: No fracture or dislocation. Small knee effusion. Mild patellofemoral and medial joint space degenerative change. Moderate severe degenerative change of the lateral joint space with subarticular sclerosis and prominent spurring. IMPRESSION: 1. Arthritis of the knee without acute osseous abnormality 2. Small knee effusion Electronically Signed   By: Donavan Foil M.D.   On: 04/28/2019 19:07   Dg Abd 1 View  Result Date: 04/28/2019 CLINICAL DATA:  Abdominal pain EXAM: ABDOMEN - 1 VIEW COMPARISON:  09/21/2013 FINDINGS: Severe scoliosis of the spine. Nonobstructed bowel-gas pattern. No radiopaque calculi. IMPRESSION: Nonobstructed bowel-gas pattern. Electronically Signed   By: Donavan Foil M.D.   On: 04/28/2019 21:27   Ct Head Wo Contrast  Result Date: 04/28/2019 CLINICAL DATA:  Status post fall. Syncopal episode. EXAM: CT HEAD WITHOUT CONTRAST CT CERVICAL SPINE WITHOUT CONTRAST TECHNIQUE: Multidetector CT imaging of the head and cervical spine was performed following the standard protocol without intravenous contrast. Multiplanar CT image reconstructions of the cervical spine were also generated. COMPARISON:  None. FINDINGS: CT HEAD FINDINGS Brain: No evidence of acute infarction, hemorrhage, hydrocephalus, extra-axial collection or mass lesion/mass effect. Moderate brain parenchymal volume loss and deep white matter microangiopathy. Vascular: Calcific atherosclerotic disease at the skull base. Skull: Normal. Negative for fracture or focal lesion. Sinuses/Orbits: Partial opacification of the right mastoid air cells. Mucosal thickening of the bilateral ethmoid sinuses. Other: None. CT CERVICAL SPINE FINDINGS  Alignment: C4-C5 2 mm anterolisthesis, likely degenerative. Skull base and vertebrae: No acute fracture. No primary bone lesion or focal pathologic process. Soft tissues and spinal canal: No prevertebral fluid or swelling. No visible canal hematoma. Disc levels:  Multilevel osteoarthritic changes. Upper chest: Biapical scarring. Other: None. IMPRESSION: 1. No acute intracranial abnormality. 2.  Atrophy, chronic microvascular disease. 3. No evidence of acute traumatic injury to cervical spine. 4. Multilevel osteoarthritic changes of the cervical spine. 5. Partial opacification of the right mastoid air cells. Ethmoid sinusitis. Electronically Signed   By: Fidela Salisbury M.D.   On: 04/28/2019 18:16   Ct Cervical Spine Wo Contrast  Result Date: 04/28/2019 CLINICAL DATA:  Status post fall. Syncopal episode. EXAM: CT HEAD WITHOUT CONTRAST CT CERVICAL SPINE WITHOUT CONTRAST TECHNIQUE: Multidetector CT imaging of the head and cervical spine was performed following the standard protocol without intravenous contrast. Multiplanar CT image reconstructions of the cervical spine were also generated. COMPARISON:  None. FINDINGS: CT HEAD FINDINGS Brain: No evidence of acute infarction, hemorrhage, hydrocephalus, extra-axial collection or mass lesion/mass effect. Moderate brain parenchymal volume loss and deep white matter microangiopathy. Vascular: Calcific atherosclerotic disease at the skull base. Skull: Normal. Negative for fracture or focal lesion. Sinuses/Orbits: Partial opacification of the right mastoid air cells. Mucosal thickening of the bilateral ethmoid sinuses. Other: None. CT CERVICAL SPINE FINDINGS Alignment: C4-C5 2 mm anterolisthesis, likely degenerative. Skull base and vertebrae: No acute fracture. No primary bone lesion or focal pathologic process. Soft tissues and spinal canal: No prevertebral fluid or swelling. No visible canal hematoma. Disc levels:  Multilevel osteoarthritic changes. Upper chest:  Biapical scarring. Other: None. IMPRESSION: 1. No acute intracranial abnormality. 2. Atrophy, chronic microvascular disease. 3. No evidence of acute traumatic injury to cervical spine. 4. Multilevel osteoarthritic changes of the cervical spine. 5. Partial opacification of the right mastoid air cells. Ethmoid sinusitis. Electronically Signed   By: Fidela Salisbury M.D.   On: 04/28/2019 18:16   Dg Chest Portable 1 View  Result Date: 04/28/2019 CLINICAL DATA:  Altered mental status, fall EXAM: PORTABLE CHEST 1 VIEW COMPARISON:  06/10/2006 FINDINGS: Pleural and parenchymal scarring at the apices. No acute airspace disease or effusion. Stable cardiomediastinal silhouette with aortic atherosclerosis. No pneumothorax. IMPRESSION: No active disease.  Stable scarring at the apices. Electronically Signed   By: Donavan Foil M.D.   On: 04/28/2019 19:06   US Abdomen Limited Ruq  Result Date: 04/29/2019 CLINICAL DATA:  Elevated liver function tests.  Inpatient. EXAM: ULTRASOUND ABDOMEN LIMITED RIGHT UPPER QUADRANT COMPARISON:  None. FINDINGS: Gallbladder: No gallstones or wall thickening visualized. No sonographic Murphy sign noted by sonographer. Common bile duct: Not visualized. Examination limited by patient body habitus. No intrahepatic biliary ductal dilatation appreciated. Liver: No focal lesion identified. Within normal limits in parenchymal echogenicity. Portal vein is patent on color Doppler imaging with normal direction of blood flow towards the liver. IMPRESSION: 1. Normal gallbladder with no cholelithiasis. 2. Common bile duct unable to be visualized. No intrahepatic biliary ductal dilatation appreciated. If there is clinical concern for biliary obstruction, CT abdomen/pelvis with oral and IV contrast could be obtained for further evaluation. 3. Normal liver. Electronically Signed   By: Ilona Sorrel M.D.   On: 04/29/2019 07:18    Lab Data:  CBC: Recent Labs  Lab 04/28/19 1632  WBC 10.1  NEUTROABS  8.1*  HGB 12.7  HCT 41.0  MCV 94.0  PLT 024   Basic Metabolic Panel: Recent Labs  Lab 04/28/19 1632 04/30/19 0717 05/01/19 0459  NA 140 138 139  K 3.7 3.6 4.0  CL 105 106 108  CO2 21* 24 23  GLUCOSE 89 88 94  BUN 18 31* 36*  CREATININE 1.03* 0.95 0.92  CALCIUM 8.0* 8.1* 8.2*  MG 2.1  --   --    GFR: Estimated Creatinine Clearance:  26.2 mL/min (by C-G formula based on SCr of 0.92 mg/dL). Liver Function Tests: Recent Labs  Lab 04/28/19 1632 04/29/19 1006  AST 55* 56*  ALT 24 24  ALKPHOS 58 53  BILITOT 1.3* 1.2  PROT 6.1* 6.1*  ALBUMIN 3.5 3.3*   Recent Labs  Lab 04/28/19 1632  LIPASE 20   Recent Labs  Lab 04/28/19 1633  AMMONIA 12   Coagulation Profile: No results for input(s): INR, PROTIME in the last 168 hours. Cardiac Enzymes: Recent Labs  Lab 04/28/19 1632 04/28/19 2223 04/29/19 0247 04/29/19 1006  TROPONINI 0.05* 0.29* 0.21* 0.14*   BNP (last 3 results) No results for input(s): PROBNP in the last 8760 hours. HbA1C: No results for input(s): HGBA1C in the last 72 hours. CBG: Recent Labs  Lab 04/28/19 1623 04/29/19 0749  GLUCAP 82 57*   Lipid Profile: No results for input(s): CHOL, HDL, LDLCALC, TRIG, CHOLHDL, LDLDIRECT in the last 72 hours. Thyroid Function Tests: Recent Labs    04/28/19 1632  TSH 16.056*  FREET4 1.37*   Anemia Panel: No results for input(s): VITAMINB12, FOLATE, FERRITIN, TIBC, IRON, RETICCTPCT in the last 72 hours. Urine analysis:    Component Value Date/Time   COLORURINE YELLOW 04/28/2019 1632   APPEARANCEUR CLOUDY (A) 04/28/2019 1632   LABSPEC 1.011 04/28/2019 1632   PHURINE 6.0 04/28/2019 1632   GLUCOSEU NEGATIVE 04/28/2019 1632   HGBUR SMALL (A) 04/28/2019 1632   BILIRUBINUR NEGATIVE 04/28/2019 1632   BILIRUBINUR negative 03/30/2017 1641   BILIRUBINUR NEG 07/22/2015 1403   KETONESUR 20 (A) 04/28/2019 1632   PROTEINUR 30 (A) 04/28/2019 1632   UROBILINOGEN 0.2 03/30/2017 1641   NITRITE NEGATIVE  04/28/2019 1632   LEUKOCYTESUR LARGE (A) 04/28/2019 1632       M.D. [facility] 05/01/2019, 12:35 PM  Pager: [phone] Between 7am to 7pm - call Pager - [phone]  After 7pm go to [email or web address] - password [email or web address]  Call night coverage person covering after 7pm

## 2019-05-01 NOTE — TOC Progression Note (Signed)
Transition of Care [facility]) - Progression Note    Patient Details  Name: Patricia Campbell MRN: [id number] Date of Birth: 1927-07-19  Transition of Care [facility]) CM/SW [place], LCSW Phone Number: 05/01/2019, 3:26 PM  Clinical Narrative:    CSW presented SNF bed offers to patient's sister. She has selected [place] since it is near her house. Facility is starting [employer], industrial/product. Patient will need updated PT note, CSW will reach out.   Expected Discharge Plan: Skilled Nursing Facility Barriers to Discharge: Insurance Authorization  Expected Discharge Plan and Services Expected Discharge Plan: [place] In-house Referral: Clinical Social Work   Post Acute Care Choice: [place] Living arrangements for the past 2 months: Single Family Home                           HH Arranged: NA           Social Determinants of Health (SDOH) Interventions    Readmission Risk Interventions No flowsheet data found.

## 2019-05-01 NOTE — Progress Notes (Signed)
Echocardiogram 2D Echocardiogram has been performed.  Patricia Campbell Patricia Campbell 05/01/2019, 10:21 AM

## 2019-05-01 NOTE — TOC Initial Note (Signed)
Transition of Care [facility]) - Initial/Assessment Note    Patient Details  Name: Patricia Campbell MRN: [id number] Date of Birth: [birth year]  Transition of Care [facility]) CM/SW Contact:    Benard Halsted, LCSW Phone Number: 05/01/2019, 1:33 PM  Clinical Narrative:                 CSW received consult for possible SNF placement at time of discharge. CSW spoke with patient's sister regarding PT recommendation of SNF placement at time of discharge. Patient's sister reported that patient lives alone and does not have family to care for patient given patient's current physical needs and fall risk and her daughter lives in another state. Patient's sister expressed understanding of PT recommendation and is agreeable to SNF placement at time of discharge. Patient's sister reports preference for [facility] but understands that options may be limited with [employer]. CSW discussed insurance authorization process and provided Medicare SNF ratings list. Patient's sister expressed being hopeful for rehab and to feel better soon. She is also aware of spend down and Medicaid process if patient needs long term care. No further questions reported at this time. CSW to continue to follow and assist with discharge planning needs.   Expected Discharge Plan: Skilled Nursing Facility Barriers to Discharge: Insurance Authorization   Patient Goals and CMS Choice Patient states their goals for this hospitalization and ongoing recovery are:: rehab CMS Medicare.gov Compare Post Acute Care list provided to:: Patient Represenative (must comment)(Sister) Choice offered to / list presented to : Sibling  Expected Discharge Plan and Services Expected Discharge Plan: [place] In-house Referral: Clinical Social Work   Post Acute Care Choice: [place] Living arrangements for the past 2 months: [place]: NA          Prior Living  Arrangements/Services Living arrangements for the past 2 months: Single Family Home Lives with:: Self Patient language and need for interpreter reviewed:: Yes Do you feel safe going back to the place where you live?: No   Lives alone  Need for Family Participation in Patient Care: Yes (Comment) Care giver support system in place?: Yes (comment) Current home services: DME Criminal Activity/Legal Involvement Pertinent to Current Situation/Hospitalization: No - Comment as needed  Activities of Daily Living      Permission Sought/Granted Permission sought to share information with : Facility Sport and exercise psychologist, Family Supports Permission granted to share information with : No  Share Information with NAME: [name]  Permission granted to share info w AGENCY: SNFs  Permission granted to share info w Relationship: Sister  Permission granted to share info w Contact Information: [phone]  Emotional Assessment Appearance:: Appears stated age Attitude/Demeanor/Rapport: Unable to Assess   Orientation: : Oriented to Self Alcohol / Substance Use: Not Applicable Psych Involvement: No (comment)  Admission diagnosis:  Urinary tract disease [N39.9] Abdominal pain [R10.9] Patient Active Problem List   Diagnosis Date Noted  . Acute encephalopathy 04/29/2019  . Syncope 04/29/2019  . Hypertensive urgency 04/29/2019  . UTI (urinary tract infection) 04/28/2019  . Pressure injury of skin 04/28/2019  . Memory loss 08/29/2016  . Insomnia 07/23/2015  . Degenerative disc disease, lumbar 07/23/2015  . History of thyroid cancer 07/23/2015  . Lumbago 06/27/2015  . Idiopathic scoliosis 03/03/2013  . Schatzki's ring 12/12/2012  . Hypothyroidism 09/16/2012  . Osteoporosis 09/16/2012  PCP:  Rutherford Guys, MD Pharmacy:   [facility] [phone] [address] [place] [address] Phone: [phone]  Fax: [phone]     Social Determinants of Health (SDOH) Interventions    Readmission Risk Interventions No flowsheet data found.

## 2019-05-01 NOTE — Progress Notes (Addendum)
Physical Therapy Treatment Patient Details Name: Patricia Campbell MRN: [id number] DOB: 1926/11/19 Today's Date: 05/01/2019    History of Present Illness Patient is a 85 year old women. She rpesented to the hospital after a witnessed fall by her family. Per note she has been having an altered mental status.  PMH: Thyroid disease, scoliosis, DDD of the lumbar and thhoriacic spine;    PT Comments    Patient able to ambulate to bathroom and presented without signs of hypotension this session.  Remains appropriate for SNF level rehab due to high fall risk with decreased safety awareness, decreased deficit awareness, cachexia, chronic postural changes all affecting balance.  PT to follow acutely.  Orthostatic VS for the past 24 hrs (Last 3 readings):  BP- Lying Pulse- Lying BP- Sitting Pulse- Sitting BP- Standing at 0 minutes Pulse- Standing at 0 minutes  05/01/19 1554 122/48 66 118/63 70 122/56 70     Follow Up Recommendations  SNF     Equipment Recommendations  Other (comment)(TBA at next venue)    Recommendations for Other Services       Precautions / Restrictions Precautions Precautions: Fall    Mobility  Bed Mobility Overal bed mobility: Needs Assistance Bed Mobility: Supine to Sit;Sit to Supine     Supine to sit: Min assist Sit to supine: Supervision   General bed mobility comments: pt puilling up on rail and with PT's hand, increased time to scoot to EOB; to supine able to lift feet into bed and cues to scoot up and able to scoot up some with time and effort  Transfers Overall transfer level: Needs assistance Equipment used: Rolling walker (2 wheeled) Transfers: Sit to/from Stand Sit to Stand: Mod assist         General transfer comment: lifting assist to stand  Ambulation/Gait Ambulation/Gait assistance: Mod assist Gait Distance (Feet): 12 Feet(x 2) Assistive device: Rolling walker (2 wheeled) Gait Pattern/deviations: Step-to pattern;Step-through  pattern;Shuffle;Trunk flexed     General Gait Details: significant kyphoscoliotic deformity and cachexia with imbalance needing mod A at times for safety even with walker   Stairs             Wheelchair Mobility    Modified Rankin (Stroke Patients Only)       Balance Overall balance assessment: Needs assistance   Sitting balance-Leahy Scale: Fair     Standing balance support: Bilateral upper extremity supported Standing balance-Leahy Scale: Poor Standing balance comment: UE support and at a minimum minguard for balance                            Cognition Arousal/Alertness: Awake/alert Behavior During Therapy: WFL for tasks assessed/performed Overall Cognitive Status: No family/caregiver present to determine baseline cognitive functioning                                 General Comments: awake and eager to get help to reposition in bed, understood after several cues plan for walking, moving with PT      Exercises      General Comments General comments (skin integrity, edema, etc.): orthostatics taken and WNL      Pertinent Vitals/Pain Pain Assessment: No/denies pain    Home Living                      Prior Function  PT Goals (current goals can now be found in the care plan section) Progress towards PT goals: Progressing toward goals    Frequency    Min 3X/week      PT Plan Current plan remains appropriate    Co-evaluation              AM-PAC PT "6 Clicks" Mobility   Outcome Measure  Help needed turning from your back to your side while in a flat bed without using bedrails?: A Little Help needed moving from lying on your back to sitting on the side of a flat bed without using bedrails?: A Little Help needed moving to and from a bed to a chair (including a wheelchair)?: A Lot Help needed standing up from a chair using your arms (e.g., wheelchair or bedside chair)?: A Lot Help needed to walk  in hospital room?: A Lot Help needed climbing 3-5 steps with a railing? : Total 6 Click Score: 13    End of Session   Activity Tolerance: Patient tolerated treatment well Patient left: in bed;with bed alarm set   PT Visit Diagnosis: Unsteadiness on feet (R26.81);Other abnormalities of gait and mobility (R26.89);Muscle weakness (generalized) (M62.81)     Time: 9093-1121 PT Time Calculation (min) (ACUTE ONLY): 24 min  Charges:  $Gait Training: 8-22 mins $Therapeutic Activity: 8-22 mins                     Magda Kiel, [place] [facility] [phone] 05/01/2019    [name] 05/01/2019, 5:16 PM

## 2019-05-01 NOTE — NC FL2 (Addendum)
[place] MEDICAID FL2 LEVEL OF CARE SCREENING TOOL     IDENTIFICATION  Patient Name: Patricia Campbell Birthdate: [birth year] Sex: female Admission Date (Current Location): 04/28/2019  [facility] and [place] Number:  Herbalist and Address:  [facility], [place] [address], [place], [place] [address]      Provider Number: [id number]  Attending Physician Name and Address:  Mendel Corning, MD  Relative Name and Phone Number:  [name] sister, [phone]    Current Level of Care: Hospital Recommended Level of Care: [place] Prior Approval Number:    Date Approved/Denied:   PASRR Number: [id number]  Discharge Plan: SNF    Current Diagnoses: Patient Active Problem List   Diagnosis Date Noted  . Acute encephalopathy 04/29/2019  . Syncope 04/29/2019  . Hypertensive urgency 04/29/2019  . UTI (urinary tract infection) 04/28/2019  . Pressure injury of skin 04/28/2019  . Memory loss 08/29/2016  . Insomnia 07/23/2015  . Degenerative disc disease, lumbar 07/23/2015  . History of thyroid cancer 07/23/2015  . Lumbago 06/27/2015  . Idiopathic scoliosis 03/03/2013  . Schatzki's ring 12/12/2012  . Hypothyroidism 09/16/2012  . Osteoporosis 09/16/2012    Orientation RESPIRATION BLADDER Height & Weight     Self  Normal Incontinent, External catheter Weight: 90 lb (40.8 kg) Height:  5\' 1"  (154.9 cm)  BEHAVIORAL SYMPTOMS/MOOD NEUROLOGICAL BOWEL NUTRITION STATUS      Continent Diet(Please see DC Summary)  AMBULATORY STATUS COMMUNICATION OF NEEDS Skin   Extensive Assist Verbally PU Stage and Appropriate Care(Stage I on sacrum)                       Personal Care Assistance Level of Assistance  Bathing, Dressing, Feeding Bathing Assistance: Maximum assistance Feeding assistance: Independent Dressing Assistance: Limited assistance     Functional Limitations Info  Sight, Hearing, Speech Sight Info: Adequate Hearing Info:  Adequate Speech Info: Adequate    SPECIAL CARE FACTORS FREQUENCY  PT (By licensed PT), OT (By licensed OT)     PT Frequency: 5x/week OT Frequency: 3x/week            Contractures Contractures Info: Not present    Additional Factors Info  Code Status, Allergies Code Status Info: DNR Allergies Info: Codeine           Current Medications (05/01/2019):  This is the current hospital active medication list Current Facility-Administered Medications  Medication Dose Route Frequency Provider Last Rate Last Dose  . acetaminophen (TYLENOL) tablet 650 mg  650 mg Oral Q6H PRN Shela Leff, MD       Or  . acetaminophen (TYLENOL) suppository 650 mg  650 mg Rectal Q6H PRN Shela Leff, MD      . aspirin EC tablet 81 mg  81 mg Oral Daily Rai, Ripudeep K, MD   81 mg at 05/01/19 0928  . atenolol (TENORMIN) tablet 25 mg  25 mg Oral Daily Rai, Ripudeep K, MD   25 mg at 05/01/19 0928  . cephALEXin (KEFLEX) capsule 500 mg  500 mg Oral Q12H Rai, Ripudeep K, MD      . enoxaparin (LOVENOX) injection 30 mg  30 mg Subcutaneous Q24H Shela Leff, MD   30 mg at 04/30/19 2121  . hydrALAZINE (APRESOLINE) injection 5 mg  5 mg Intravenous Q4H PRN Shela Leff, MD      . levothyroxine (SYNTHROID) tablet 75 mcg  75 mcg Oral QAC breakfast Shela Leff, MD   75 mcg at  05/01/19 5500  . mirtazapine (REMERON) tablet 7.5 mg  7.5 mg Oral QHS Rai, Ripudeep K, MD   7.5 mg at 04/30/19 2121     Discharge Medications: Please see discharge summary for a list of discharge medications.  Relevant Imaging Results:  Relevant Lab Results:   Additional Information SSN: [id number]  DLOPR negative on 6/19  Benard Halsted, LCSW

## 2019-05-02 LAB — BASIC METABOLIC PANEL
Anion gap: 7 (ref 5–15)
BUN: 26 mg/dL — ABNORMAL HIGH (ref 8–23)
CO2: 25 mmol/L (ref 22–32)
Calcium: 8.3 mg/dL — ABNORMAL LOW (ref 8.9–10.3)
Chloride: 108 mmol/L (ref 98–111)
Creatinine, Ser: 0.72 mg/dL (ref 0.44–1.00)
GFR calc Af Amer: >60 mL/min (ref >60)
GFR calc non Af Amer: >60 mL/min (ref >60)
Glucose, Bld: 93 mg/dL (ref 70–99)
Potassium: 4.1 mmol/L (ref 3.5–5.1)
Sodium: 140 mmol/L (ref 135–145)

## 2019-05-02 NOTE — Progress Notes (Signed)
[facility]                                                                              Patient Demographics  Patricia Campbell, is a 85 y.o. female, DOB - 1927/08/03, EXB:[id number]  Admit date - 04/28/2019   Admitting Physician Shela Leff, MD  Outpatient Primary MD for the patient is Rutherford Guys, MD  Outpatient specialists:   LOS - 3  days   Medical records reviewed and are as summarized below:    No chief complaint on file.      Brief summary   Patient is a 85 year old female with history of thyroid CA status post thyroidectomy, degenerative disc disease, osteoporosis presented to ED with altered mental status, syncope.  Per EMS, patient lives alone at home and had a fall witnessed by family.  No loss of consciousness.  Per EMS, patient was hypotensive and 500 cc bolus was given.  In ED, BP elevated systolic ranging from 102V to 210, lactic acid 2.1, afebrile, tachycardia, ammonia level normal.  UA with positive UTI.  Chest x-ray showed no active disease.  Patient was admitted for further work-up. COVID-19 test in process  Assessment & Plan    Principal Problem: E. coli urinary tract infection -Urine culture showed more than 100,000 colonies of E. Coli - cont keflex  Active problems Acute metabolic encephalopathy superimposed on dementia -Head CT with no acute findings, showed atrophy with chronic microvascular disease, likely has some underlying dementia.  No focal neurological deficits. -She still has waxing and waning mental status with sundowning.  Per sister, she is confused most of the time due to dementia.   -Continue low-dose Remeron.  Fall, syncope  -Patient presented with elevated BP, no seizure activity.  No chest pain or shortness of breath.  Troponins positive -PT evaluation recommended skilled nursing facility -2D echo showed EF of > 25%, mild diastolic dysfunction  Elevated troponins -Likely due to hypertensive urgency, demand  ischemia, no chest pain or shortness of breath on exam -2D echo showed preserved EF and mild diastolic dysfunction  Hypertensive urgency BP better controlled, continue atenolol.   Elevated LFTs -Mildly elevated AST, right upper quadrant ultrasound showed normal gallbladder with no cholelithiasis, no intrahepatic biliary ductal dilatation  History of thyroidectomy, on thyroid hormone replacement therapy -Continue home Synthroid -Free T4 mildly elevated 1.3, TSH 16.0, outpatient follow-up with PCP   Code Status: DNR DNI, discussed with patient's sister on 6/20  DVT Prophylaxis:  Lovenox  Family Communication: Discussed in detail with the patient, all imaging results, lab results explained to the patient's sister    Disposition Plan: Lives alone, per patient sister, not safe at home with memory issues.  Social work consulted for skilled nursing facility  Time Spent in minutes 25 minutes  Procedures:  None  Consultants:   None  Antimicrobials:   Anti-infectives (From admission, onward)   Start     Dose/Rate Route Frequency Ordered Stop   05/01/19 1245  cephALEXin (KEFLEX) capsule 500 mg     500 mg Oral Every 12 hours 05/01/19 1236     05/01/19 1100  ceFAZolin (ANCEF) IVPB 1 g/50  mL premix  Status:  Discontinued     1 g 100 mL/hr over 30 Minutes Intravenous Every 12 hours 05/01/19 1053 05/01/19 1235   04/29/19 1830  cefTRIAXone (ROCEPHIN) 1 g in sodium chloride 0.9 % 100 mL IVPB  Status:  Discontinued     1 g 200 mL/hr over 30 Minutes Intravenous Every 24 hours 04/28/19 2105 05/01/19 1052   04/28/19 1730  cefTRIAXone (ROCEPHIN) 1 g in sodium chloride 0.9 % 100 mL IVPB     1 g 200 mL/hr over 30 Minutes Intravenous  Once 04/28/19 1726 04/28/19 1824         Medications  Scheduled Meds: . aspirin EC  81 mg Oral Daily  . atenolol  25 mg Oral Daily  . cephALEXin  500 mg Oral Q12H  . enoxaparin (LOVENOX) injection  30 mg Subcutaneous Q24H  . levothyroxine  75 mcg Oral  QAC breakfast  . mirtazapine  7.5 mg Oral QHS   Continuous Infusions:  PRN Meds:.acetaminophen **OR** acetaminophen, hydrALAZINE      Subjective:   Patricia Campbell was seen and examined today.  Alert and awake, oriented x2.  No acute issues overnight.  No fevers or chills.  No nausea vomiting, abdominal pain.  Still somewhat confused, appears to be her baseline dementia  Objective:   Vitals:   05/01/19 1501 05/01/19 2051 05/02/19 0547 05/02/19 0555  BP: 128/61 (!) 144/67 133/65 (!) 136/49  Pulse: 66 76 66 62  Resp: 18     Temp: 97.7 F (36.5 C) 98.2 F (36.8 C) 97.6 F (36.4 C) 98 F (36.7 C)  TempSrc: Oral Oral Oral Oral  SpO2: 96% 95% 96% 100%  Weight:      Height:        Intake/Output Summary (Last 24 hours) at 05/02/2019 1425 Last data filed at 05/02/2019 0900 Gross per 24 hour  Intake 510 ml  Output 1250 ml  Net -740 ml     Wt Readings from Last 3 Encounters:  04/28/19 40.8 kg  11/11/18 42.6 kg  04/15/18 42.2 kg   Physical Exam  General: Alert and oriented x 2, states she is in "[place] system", pleasant and cooperative NAD  Eyes:  HEENT:  Atraumatic, normocephalic  Cardiovascular: S1 S2 clear,  RRR. No pedal edema b/l  Respiratory: CTAB, no wheezing, rales or rhonchi  Gastrointestinal: Soft, nontender, nondistended, NBS  Ext: no pedal edema bilaterally  Neuro: no new deficits  Musculoskeletal: No cyanosis, clubbing  Skin: No rashes  Psych: Pleasantly and slightly confused    Data Reviewed:  I have personally reviewed following labs and imaging studies  Micro Results Recent Results (from the past 240 hour(s))  Urine culture     Status: Abnormal   Collection Time: 04/28/19  4:33 PM   Specimen: Urine, Random  Result Value Ref Range Status   Specimen Description URINE, RANDOM  Final   Special Requests   Final    NONE Performed at [facility], [address], [place], [place] [address]    Culture >=100,000 COLONIES/mL  ESCHERICHIA COLI (A)  Final   Report Status 04/30/2019 FINAL  Final   Organism ID, Bacteria ESCHERICHIA COLI (A)  Final      Susceptibility   Escherichia coli - MIC*    AMPICILLIN >=32 RESISTANT Resistant     CEFAZOLIN <=4 SENSITIVE Sensitive     CEFTRIAXONE <=1 SENSITIVE Sensitive     CIPROFLOXACIN <=0.25 SENSITIVE Sensitive     GENTAMICIN >=16 RESISTANT Resistant  IMIPENEM 0.5 SENSITIVE Sensitive     NITROFURANTOIN <=16 SENSITIVE Sensitive     TRIMETH/SULFA >=320 RESISTANT Resistant     AMPICILLIN/SULBACTAM 8 SENSITIVE Sensitive     PIP/TAZO <=4 SENSITIVE Sensitive     Extended ESBL NEGATIVE Sensitive     * >=100,000 COLONIES/mL ESCHERICHIA COLI  Novel Coronavirus,NAA,(SEND-OUT TO REF LAB - TAT 24-48 hrs); Hosp Order     Status: None   Collection Time: 04/28/19  6:50 PM   Specimen: Nasopharyngeal Swab; Respiratory  Result Value Ref Range Status   SARS-CoV-2, NAA NOT DETECTED NOT DETECTED Final    Comment: (NOTE) This test was developed and its performance characteristics determined by [employer]. This test has not been FDA cleared or approved. This test has been authorized by FDA under an Emergency Use Authorization (EUA). This test is only authorized for the duration of time the declaration that circumstances exist justifying the authorization of the emergency use of in vitro diagnostic tests for detection of SARS-CoV-2 virus and/or diagnosis of COVID-19 infection under section 564(b)(1) of the Act, 21 U.S.C. 793JQZ-0(S)(9), unless the authorization is terminated or revoked sooner. When diagnostic testing is negative, the possibility of a false negative result should be considered in the context of a patient's recent exposures and the presence of clinical signs and symptoms consistent with COVID-19. An individual without symptoms of COVID-19 and who is not shedding SARS-CoV-2 virus would expect to have a negative (not detected) result in this assay. Performed   At: [facility] [address] [place], [place] [id number] Rush Farmer MD QJ:[id number]    Leslie  Final    Comment: Performed at [facility], [place] [address], [place], [place] [address]    Radiology Reports Dg Knee 2 Views Left  Result Date: 04/28/2019 CLINICAL DATA:  Fall EXAM: LEFT KNEE - 1-2 VIEW COMPARISON:  03/30/2017 FINDINGS: No fracture or dislocation. Small knee effusion. Mild patellofemoral and medial joint space degenerative change. Moderate severe degenerative change of the lateral joint space with subarticular sclerosis and prominent spurring. IMPRESSION: 1. Arthritis of the knee without acute osseous abnormality 2. Small knee effusion Electronically Signed   By: Donavan Foil M.D.   On: 04/28/2019 19:07   Dg Abd 1 View  Result Date: 04/28/2019 CLINICAL DATA:  Abdominal pain EXAM: ABDOMEN - 1 VIEW COMPARISON:  09/21/2013 FINDINGS: Severe scoliosis of the spine. Nonobstructed bowel-gas pattern. No radiopaque calculi. IMPRESSION: Nonobstructed bowel-gas pattern. Electronically Signed   By: Donavan Foil M.D.   On: 04/28/2019 21:27   Ct Head Wo Contrast  Result Date: 04/28/2019 CLINICAL DATA:  Status post fall. Syncopal episode. EXAM: CT HEAD WITHOUT CONTRAST CT CERVICAL SPINE WITHOUT CONTRAST TECHNIQUE: Multidetector CT imaging of the head and cervical spine was performed following the standard protocol without intravenous contrast. Multiplanar CT image reconstructions of the cervical spine were also generated. COMPARISON:  None. FINDINGS: CT HEAD FINDINGS Brain: No evidence of acute infarction, hemorrhage, hydrocephalus, extra-axial collection or mass lesion/mass effect. Moderate brain parenchymal volume loss and deep white matter microangiopathy. Vascular: Calcific atherosclerotic disease at the skull base. Skull: Normal. Negative for fracture or focal lesion. Sinuses/Orbits: Partial opacification of the right mastoid air cells. Mucosal  thickening of the bilateral ethmoid sinuses. Other: None. CT CERVICAL SPINE FINDINGS Alignment: C4-C5 2 mm anterolisthesis, likely degenerative. Skull base and vertebrae: No acute fracture. No primary bone lesion or focal pathologic process. Soft tissues and spinal canal: No prevertebral fluid or swelling. No visible canal hematoma. Disc levels:  Multilevel osteoarthritic changes. Upper chest: Biapical scarring. Other: None. IMPRESSION: 1. No acute intracranial abnormality. 2. Atrophy, chronic microvascular disease. 3. No evidence of acute traumatic injury to cervical spine. 4. Multilevel osteoarthritic changes of the cervical spine. 5. Partial opacification of the right mastoid air cells. Ethmoid sinusitis. Electronically Signed   By: Fidela Salisbury M.D.   On: 04/28/2019 18:16   Ct Cervical Spine Wo Contrast  Result Date: 04/28/2019 CLINICAL DATA:  Status post fall. Syncopal episode. EXAM: CT HEAD WITHOUT CONTRAST CT CERVICAL SPINE WITHOUT CONTRAST TECHNIQUE: Multidetector CT imaging of the head and cervical spine was performed following the standard protocol without intravenous contrast. Multiplanar CT image reconstructions of the cervical spine were also generated. COMPARISON:  None. FINDINGS: CT HEAD FINDINGS Brain: No evidence of acute infarction, hemorrhage, hydrocephalus, extra-axial collection or mass lesion/mass effect. Moderate brain parenchymal volume loss and deep white matter microangiopathy. Vascular: Calcific atherosclerotic disease at the skull base. Skull: Normal. Negative for fracture or focal lesion. Sinuses/Orbits: Partial opacification of the right mastoid air cells. Mucosal thickening of the bilateral ethmoid sinuses. Other: None. CT CERVICAL SPINE FINDINGS Alignment: C4-C5 2 mm anterolisthesis, likely degenerative. Skull base and vertebrae: No acute fracture. No primary bone lesion or focal pathologic process. Soft tissues and spinal canal: No prevertebral fluid or swelling. No  visible canal hematoma. Disc levels:  Multilevel osteoarthritic changes. Upper chest: Biapical scarring. Other: None. IMPRESSION: 1. No acute intracranial abnormality. 2. Atrophy, chronic microvascular disease. 3. No evidence of acute traumatic injury to cervical spine. 4. Multilevel osteoarthritic changes of the cervical spine. 5. Partial opacification of the right mastoid air cells. Ethmoid sinusitis. Electronically Signed   By: Fidela Salisbury M.D.   On: 04/28/2019 18:16   Dg Chest Portable 1 View  Result Date: 04/28/2019 CLINICAL DATA:  Altered mental status, fall EXAM: PORTABLE CHEST 1 VIEW COMPARISON:  06/10/2006 FINDINGS: Pleural and parenchymal scarring at the apices. No acute airspace disease or effusion. Stable cardiomediastinal silhouette with aortic atherosclerosis. No pneumothorax. IMPRESSION: No active disease.  Stable scarring at the apices. Electronically Signed   By: Donavan Foil M.D.   On: 04/28/2019 19:06   US Abdomen Limited Ruq  Result Date: 04/29/2019 CLINICAL DATA:  Elevated liver function tests.  Inpatient. EXAM: ULTRASOUND ABDOMEN LIMITED RIGHT UPPER QUADRANT COMPARISON:  None. FINDINGS: Gallbladder: No gallstones or wall thickening visualized. No sonographic Murphy sign noted by sonographer. Common bile duct: Not visualized. Examination limited by patient body habitus. No intrahepatic biliary ductal dilatation appreciated. Liver: No focal lesion identified. Within normal limits in parenchymal echogenicity. Portal vein is patent on color Doppler imaging with normal direction of blood flow towards the liver. IMPRESSION: 1. Normal gallbladder with no cholelithiasis. 2. Common bile duct unable to be visualized. No intrahepatic biliary ductal dilatation appreciated. If there is clinical concern for biliary obstruction, CT abdomen/pelvis with oral and IV contrast could be obtained for further evaluation. 3. Normal liver. Electronically Signed   By: Ilona Sorrel M.D.   On: 04/29/2019  07:18    Lab Data:  CBC: Recent Labs  Lab 04/28/19 1632  WBC 10.1  NEUTROABS 8.1*  HGB 12.7  HCT 41.0  MCV 94.0  PLT 932   Basic Metabolic Panel: Recent Labs  Lab 04/28/19 1632 04/30/19 0717 05/01/19 0459 05/02/19 0438  NA 140 138 139 140  K 3.7 3.6 4.0 4.1  CL 105 106 108 108  CO2 21* 24 23 25   GLUCOSE 89 88 94 93  BUN 18 31* 36* 26*  CREATININE 1.03* 0.95 0.92 0.72  CALCIUM 8.0* 8.1* 8.2* 8.3*  MG 2.1  --   --   --    GFR: Estimated Creatinine Clearance: 30.1 mL/min (by C-G formula based on SCr of 0.72 mg/dL). Liver Function Tests: Recent Labs  Lab 04/28/19 1632 04/29/19 1006  AST 55* 56*  ALT 24 24  ALKPHOS 58 53  BILITOT 1.3* 1.2  PROT 6.1* 6.1*  ALBUMIN 3.5 3.3*   Recent Labs  Lab 04/28/19 1632  LIPASE 20   Recent Labs  Lab 04/28/19 1633  AMMONIA 12   Coagulation Profile: No results for input(s): INR, PROTIME in the last 168 hours. Cardiac Enzymes: Recent Labs  Lab 04/28/19 1632 04/28/19 2223 04/29/19 0247 04/29/19 1006  TROPONINI 0.05* 0.29* 0.21* 0.14*   BNP (last 3 results) No results for input(s): PROBNP in the last 8760 hours. HbA1C: No results for input(s): HGBA1C in the last 72 hours. CBG: Recent Labs  Lab 04/28/19 1623 04/29/19 0749  GLUCAP 82 57*   Lipid Profile: No results for input(s): CHOL, HDL, LDLCALC, TRIG, CHOLHDL, LDLDIRECT in the last 72 hours. Thyroid Function Tests: No results for input(s): TSH, T4TOTAL, FREET4, T3FREE, THYROIDAB in the last 72 hours. Anemia Panel: No results for input(s): VITAMINB12, FOLATE, FERRITIN, TIBC, IRON, RETICCTPCT in the last 72 hours. Urine analysis:    Component Value Date/Time   COLORURINE YELLOW 04/28/2019 1632   APPEARANCEUR CLOUDY (A) 04/28/2019 1632   LABSPEC 1.011 04/28/2019 1632   PHURINE 6.0 04/28/2019 1632   GLUCOSEU NEGATIVE 04/28/2019 1632   HGBUR SMALL (A) 04/28/2019 1632   BILIRUBINUR NEGATIVE 04/28/2019 1632   BILIRUBINUR negative 03/30/2017 1641    BILIRUBINUR NEG 07/22/2015 1403   KETONESUR 20 (A) 04/28/2019 1632   PROTEINUR 30 (A) 04/28/2019 1632   UROBILINOGEN 0.2 03/30/2017 1641   NITRITE NEGATIVE 04/28/2019 1632   LEUKOCYTESUR LARGE (A) 04/28/2019 1632     Tattiana Fakhouri M.D. [facility] 05/02/2019, 2:25 PM  Pager: [phone] Between 7am to 7pm - call Pager - [phone]  After 7pm go to [email or web address] - password [email or web address]  Call night coverage person covering after 7pm

## 2019-05-02 NOTE — Progress Notes (Signed)
Pt did allow tele monitor to be placed back on her this morning.

## 2019-05-02 NOTE — Care Management Important Message (Signed)
Important Message  Patient Details  Name: Patricia Campbell MRN: [id number] Date of Birth: [birth year]   Medicare Important Message Given:  Yes  Due to illness patient was not able to sign.  Sign on patient behalf.   Soua Lenk 05/02/2019, 12:07 PM

## 2019-05-02 NOTE — Progress Notes (Signed)
Pt refuses to were her tele box she stated that she may let the RN put it back on her in the morning, but not tonight. Pt was educated. Central [employer] notified. Will continue to monitor.

## 2019-05-03 DIAGNOSIS — R55 Syncope and collapse: Secondary | ICD-10-CM

## 2019-05-03 LAB — NOVEL CORONAVIRUS, NAA (HOSP ORDER, SEND-OUT TO REF LAB; TAT 18-24 HRS): SARS-CoV-2, NAA: NOT DETECTED

## 2019-05-03 NOTE — Progress Notes (Signed)
Pt refused labs this morning.  

## 2019-05-03 NOTE — Progress Notes (Signed)
Physical Therapy Treatment Patient Details Name: Patricia Campbell MRN: [id number] DOB: 01-13-1927 Today's Date: 05/03/2019    History of Present Illness Patient is a 85 year old women. She rpesented to the hospital after a witnessed fall by her family. Per note she has been having an altered mental status.  PMH: Thyroid disease, scoliosis, DDD of the lumbar and thhoriacic spine;    PT Comments    Patient seen for mobility progression. Pt is making progress toward PT goals and tolerated gait distance of 30 ft with min A and RW. Pt is oriented to self only. Continue to progress as tolerated with anticipated d/c to SNF for further skilled PT services.      Follow Up Recommendations  SNF     Equipment Recommendations  Other (comment)(TBA at next venue)    Recommendations for Other Services       Precautions / Restrictions Precautions Precautions: Fall Restrictions Weight Bearing Restrictions: No    Mobility  Bed Mobility Overal bed mobility: Needs Assistance Bed Mobility: Supine to Sit     Supine to sit: Min assist Sit to supine: Supervision   General bed mobility comments: increased time and effort; cues for follow through of task; use of rails   Transfers Overall transfer level: Needs assistance Equipment used: Rolling walker (2 wheeled) Transfers: Sit to/from Stand Sit to Stand: Min assist         General transfer comment: cues for safe hand placement; assist to power up into standing  Ambulation/Gait Ambulation/Gait assistance: Min assist Gait Distance (Feet): 30 Feet Assistive device: Rolling walker (2 wheeled) Gait Pattern/deviations: Step-through pattern;Trunk flexed;Decreased stride length Gait velocity: decreased   General Gait Details: cues for upright posture and safe use of AD; assist to steady     Stairs             Wheelchair Mobility    Modified Rankin (Stroke Patients Only)       Balance Overall balance assessment: Needs  assistance Sitting-balance support: Feet supported Sitting balance-Leahy Scale: Fair     Standing balance support: Bilateral upper extremity supported Standing balance-Leahy Scale: Poor                              Cognition Arousal/Alertness: Awake/alert Behavior During Therapy: WFL for tasks assessed/performed Overall Cognitive Status: No family/caregiver present to determine baseline cognitive functioning Area of Impairment: Orientation;Memory;Safety/judgement;Problem solving                 Orientation Level: Disoriented to;Place;Time;Situation   Memory: Decreased short-term memory   Safety/Judgement: Decreased awareness of safety;Decreased awareness of deficits   Problem Solving: Difficulty sequencing;Requires verbal cues        Exercises      General Comments        Pertinent Vitals/Pain Pain Assessment: No/denies pain    Home Living                      Prior Function            PT Goals (current goals can now be found in the care plan section) Progress towards PT goals: Progressing toward goals    Frequency    Min 3X/week      PT Plan Current plan remains appropriate    Co-evaluation              AM-PAC PT "6 Clicks" Mobility   Outcome Measure  Help needed turning  from your back to your side while in a flat bed without using bedrails?: A Little Help needed moving from lying on your back to sitting on the side of a flat bed without using bedrails?: A Little Help needed moving to and from a bed to a chair (including a wheelchair)?: A Little Help needed standing up from a chair using your arms (e.g., wheelchair or bedside chair)?: A Little Help needed to walk in hospital room?: A Little Help needed climbing 3-5 steps with a railing? : A Lot 6 Click Score: 17    End of Session Equipment Utilized During Treatment: Gait belt Activity Tolerance: Patient tolerated treatment well Patient left: in chair;with call  bell/phone within reach;with chair alarm set Nurse Communication: Mobility status PT Visit Diagnosis: Unsteadiness on feet (R26.81);Other abnormalities of gait and mobility (R26.89);Muscle weakness (generalized) (M62.81)     Time: 7902-4097 PT Time Calculation (min) (ACUTE ONLY): 25 min  Charges:  $Gait Training: 23-37 mins                     Earney Navy, PTA Acute Rehabilitation Services Pager: [phone] Office: [phone]     Darliss Cheney 05/03/2019, 3:00 PM

## 2019-05-03 NOTE — Progress Notes (Signed)
PROGRESS NOTE    Patricia Campbell  IRS:[id number] DOB: April 22, 1927 DOA: 04/28/2019 PCP: Rutherford Guys, MD   Brief Narrative:  HPI on 04/28/2019 by Dr. Shela Leff  Patricia Campbell is a 85 y.o. female with medical history significant of thyroid cancer status post thyroidectomy, degenerative disc disease, osteoporosis presenting to the hospital via EMS for evaluation of altered mental status and syncope.  Per his EMS report, patient lives alone at home and had a fall witnessed by family.  No loss of consciousness.  Upon arrival of the fire department, patient had a syncopal episode.  Hypotensive per EMS and a 500 cc bolus was given. Patient is confused and oriented to self only.  No history could be obtained from her.  No family available at this time. Assessment & Plan   E. coli urinary tract infection -Urine culture >100K EColi -Continue keflex   Acute metabolic encephalopathy superimposed on dementia -CT head with no acute findings, showed atrophy with chronic microvascular disease, likely has some underlying dementia.  No focal neurological deficits. -She still has waxing and waning mental status with sundowning.  Per sister, she is confused most of the time due to dementia.   -Continue low-dose Remeron.  Fall, syncope  -Patient presented with elevated BP, no seizure activity.  No chest pain or shortness of breath.  Troponins positive -PT evaluation recommended skilled nursing facility -2D echo showed EF of > 00%, mild diastolic dysfunction  Elevated troponins -Likely due to hypertensive urgency, demand ischemia, no chest pain or shortness of breath on exam -2D echo showed preserved EF and mild diastolic dysfunction  Hypertensive urgency -BP better controlled, continue atenolol.   Elevated LFTs -Mildly elevated AST,  -RUQ US showed normal gallbladder with no cholelithiasis, no intrahepatic biliary ductal dilatation  History of thyroidectomy, on thyroid hormone replacement  therapy -Continue home Synthroid -Free T4 mildly elevated 1.3, TSH 16.0 -will need outpatient follow up with PCP and repeat labs    DVT Prophylaxis  Lovenox  Code Status: DNR  Family Communication: None at bedside.  Disposition Plan: Admitted. Pending SNF.  COVID test pending  Consultants None  Procedures  None  Antibiotics   Anti-infectives (From admission, onward)   Start     Dose/Rate Route Frequency Ordered Stop   05/01/19 1245  cephALEXin (KEFLEX) capsule 500 mg     500 mg Oral Every 12 hours 05/01/19 1236     05/01/19 1100  ceFAZolin (ANCEF) IVPB 1 g/50 mL premix  Status:  Discontinued     1 g 100 mL/hr over 30 Minutes Intravenous Every 12 hours 05/01/19 1053 05/01/19 1235   04/29/19 1830  cefTRIAXone (ROCEPHIN) 1 g in sodium chloride 0.9 % 100 mL IVPB  Status:  Discontinued     1 g 200 mL/hr over 30 Minutes Intravenous Every 24 hours 04/28/19 2105 05/01/19 1052   04/28/19 1730  cefTRIAXone (ROCEPHIN) 1 g in sodium chloride 0.9 % 100 mL IVPB     1 g 200 mL/hr over 30 Minutes Intravenous  Once 04/28/19 1726 04/28/19 1824      Subjective:   Patricia Campbell seen and examined today.  Patient with dementia. No complaints. Objective:   Vitals:   05/02/19 0555 05/02/19 2050 05/03/19 0552 05/03/19 1303  BP: (!) 136/49 (!) 148/61 (!) 174/73 (!) 152/68  Pulse: 62 73 63 (!) 58  Resp:    17  Temp: 98 F (36.7 C) 97.9 F (36.6 C) (!) 97.5 F (36.4 C) 98.3 F (36.8 C)  TempSrc: Oral Oral Oral Oral  SpO2: 100% 97% 97%   Weight:      Height:        Intake/Output Summary (Last 24 hours) at 05/03/2019 1553 Last data filed at 05/03/2019 0900 Gross per 24 hour  Intake 410 ml  Output -  Net 410 ml   Filed Weights   04/28/19 1606  Weight: 40.8 kg    Exam  General: Well developed, well nourished, NAD, appears stated age  64: NCAT, mucous membranes moist.   Cardiovascular: S1 S2 auscultated, RRR, no murmur  Respiratory: Clear to auscultation bilaterally. No  wheezing  Abdomen: Soft, nontender, nondistended, + bowel sounds  Extremities: warm dry without cyanosis clubbing or edema  Neuro: AAOx2, nonfocal   Psych: Pleasantly confused    Data Reviewed: I have personally reviewed following labs and imaging studies  CBC: Recent Labs  Lab 04/28/19 1632  WBC 10.1  NEUTROABS 8.1*  HGB 12.7  HCT 41.0  MCV 94.0  PLT 671   Basic Metabolic Panel: Recent Labs  Lab 04/28/19 1632 04/30/19 0717 05/01/19 0459 05/02/19 0438  NA 140 138 139 140  K 3.7 3.6 4.0 4.1  CL 105 106 108 108  CO2 21* 24 23 25   GLUCOSE 89 88 94 93  BUN 18 31* 36* 26*  CREATININE 1.03* 0.95 0.92 0.72  CALCIUM 8.0* 8.1* 8.2* 8.3*  MG 2.1  --   --   --    GFR: Estimated Creatinine Clearance: 30.1 mL/min (by C-G formula based on SCr of 0.72 mg/dL). Liver Function Tests: Recent Labs  Lab 04/28/19 1632 04/29/19 1006  AST 55* 56*  ALT 24 24  ALKPHOS 58 53  BILITOT 1.3* 1.2  PROT 6.1* 6.1*  ALBUMIN 3.5 3.3*   Recent Labs  Lab 04/28/19 1632  LIPASE 20   Recent Labs  Lab 04/28/19 1633  AMMONIA 12   Coagulation Profile: No results for input(s): INR, PROTIME in the last 168 hours. Cardiac Enzymes: Recent Labs  Lab 04/28/19 1632 04/28/19 2223 04/29/19 0247 04/29/19 1006  TROPONINI 0.05* 0.29* 0.21* 0.14*   BNP (last 3 results) No results for input(s): PROBNP in the last 8760 hours. HbA1C: No results for input(s): HGBA1C in the last 72 hours. CBG: Recent Labs  Lab 04/28/19 1623 04/29/19 0749  GLUCAP 82 57*   Lipid Profile: No results for input(s): CHOL, HDL, LDLCALC, TRIG, CHOLHDL, LDLDIRECT in the last 72 hours. Thyroid Function Tests: No results for input(s): TSH, T4TOTAL, FREET4, T3FREE, THYROIDAB in the last 72 hours. Anemia Panel: No results for input(s): VITAMINB12, FOLATE, FERRITIN, TIBC, IRON, RETICCTPCT in the last 72 hours. Urine analysis:    Component Value Date/Time   COLORURINE YELLOW 04/28/2019 1632   APPEARANCEUR CLOUDY  (A) 04/28/2019 1632   LABSPEC 1.011 04/28/2019 1632   PHURINE 6.0 04/28/2019 1632   GLUCOSEU NEGATIVE 04/28/2019 1632   HGBUR SMALL (A) 04/28/2019 [place] 04/28/2019 1632   BILIRUBINUR negative 03/30/2017 1641   BILIRUBINUR NEG 07/22/2015 1403   KETONESUR 20 (A) 04/28/2019 1632   PROTEINUR 30 (A) 04/28/2019 1632   UROBILINOGEN 0.2 03/30/2017 1641   NITRITE NEGATIVE 04/28/2019 1632   LEUKOCYTESUR LARGE (A) 04/28/2019 1632   Sepsis Labs: @LABRCNTIP (procalcitonin:4,lacticidven:4)  ) Recent Results (from the past 240 hour(s))  Urine culture     Status: Abnormal   Collection Time: 04/28/19  4:33 PM   Specimen: Urine, Random  Result Value Ref Range Status   Specimen Description URINE, RANDOM  Final  Special Requests   Final    NONE Performed at [facility], [place] [address], [place], [place] [address]    Culture >=100,000 COLONIES/mL ESCHERICHIA COLI (A)  Final   Report Status 04/30/2019 FINAL  Final   Organism ID, Bacteria ESCHERICHIA COLI (A)  Final      Susceptibility   Escherichia coli - MIC*    AMPICILLIN >=32 RESISTANT Resistant     CEFAZOLIN <=4 SENSITIVE Sensitive     CEFTRIAXONE <=1 SENSITIVE Sensitive     CIPROFLOXACIN <=0.25 SENSITIVE Sensitive     GENTAMICIN >=16 RESISTANT Resistant     IMIPENEM 0.5 SENSITIVE Sensitive     NITROFURANTOIN <=16 SENSITIVE Sensitive     TRIMETH/SULFA >=320 RESISTANT Resistant     AMPICILLIN/SULBACTAM 8 SENSITIVE Sensitive     PIP/TAZO <=4 SENSITIVE Sensitive     Extended ESBL NEGATIVE Sensitive     * >=100,000 COLONIES/mL ESCHERICHIA COLI  Novel Coronavirus,NAA,(SEND-OUT TO REF LAB - TAT 24-48 hrs); Hosp Order     Status: None   Collection Time: 04/28/19  6:50 PM   Specimen: Nasopharyngeal Swab; Respiratory  Result Value Ref Range Status   SARS-CoV-2, NAA NOT DETECTED NOT DETECTED Final    Comment: (NOTE) This test was developed and its performance characteristics determined by [employer]. This  test has not been FDA cleared or approved. This test has been authorized by FDA under an Emergency Use Authorization (EUA). This test is only authorized for the duration of time the declaration that circumstances exist justifying the authorization of the emergency use of in vitro diagnostic tests for detection of SARS-CoV-2 virus and/or diagnosis of COVID-19 infection under section 564(b)(1) of the Act, 21 U.S.C. 656CLE-7(N)(1), unless the authorization is terminated or revoked sooner. When diagnostic testing is negative, the possibility of a false negative result should be considered in the context of a patient's recent exposures and the presence of clinical signs and symptoms consistent with COVID-19. An individual without symptoms of COVID-19 and who is not shedding SARS-CoV-2 virus would expect to have a negative (not detected) result in this assay. Performed  At: [facility] [address] [place], [place] [id number] Rush Farmer MD HQ:[id number]    [place]  Final    Comment: Performed at [facility], [place] [address] [place], [place] [address]      Radiology Studies: No results found.   Scheduled Meds: . aspirin EC  81 mg Oral Daily  . atenolol  25 mg Oral Daily  . cephALEXin  500 mg Oral Q12H  . enoxaparin (LOVENOX) injection  30 mg Subcutaneous Q24H  . levothyroxine  75 mcg Oral QAC breakfast  . mirtazapine  7.5 mg Oral QHS   Continuous Infusions:   LOS: 4 days   Time Spent in minutes   30 minutes  Gilad Dugger D.O. on 05/03/2019 at 3:53 PM  Between 7am to 7pm - Please see pager noted on [email or web address]  After 7pm go to [email or web address]  And look for the night coverage person covering for me after hours  [facility] Office  [phone]

## 2019-05-04 NOTE — Progress Notes (Addendum)
NCM reached out to pt's financial counselor, Bearl Mulberry, @ [phone] for assistance with processing Medicaid application. NCM learned Michelle's office hrs are 8am-2pm. NCM left voice message. Sharyn Lull to f/u with NCM or CSW on 05/04/2019.  Whitman Hero RN,BSN,CM

## 2019-05-04 NOTE — Progress Notes (Signed)
PROGRESS NOTE    Patricia Campbell  FTD:[id number] DOB: [birth year] DOA: 04/28/2019 PCP: Rutherford Guys, MD   Brief Narrative:  HPI on 04/28/2019 by Dr. Shela Leff  Patricia Campbell is a 85 y.o. female with medical history significant of thyroid cancer status post thyroidectomy, degenerative disc disease, osteoporosis presenting to the hospital via EMS for evaluation of altered mental status and syncope.  Per his EMS report, patient lives alone at home and had a fall witnessed by family.  No loss of consciousness.  Upon arrival of the fire department, patient had a syncopal episode.  Hypotensive per EMS and a 500 cc bolus was given. Patient is confused and oriented to self only.  No history could be obtained from her.  No family available at this time. Assessment & Plan   E. coli urinary tract infection -Urine culture >100K EColi -Continue keflex   Acute metabolic encephalopathy superimposed on dementia -CT head with no acute findings, showed atrophy with chronic microvascular disease, likely has some underlying dementia.  No focal neurological deficits. -She still has waxing and waning mental status with sundowning.  Per sister, she is confused most of the time due to dementia.   -Continue low-dose Remeron.  Fall, syncope  -Patient presented with elevated BP, no seizure activity.  No chest pain or shortness of breath.  Troponins positive -PT evaluation recommended skilled nursing facility: "Patient seen for mobility progression. Pt is making progress toward PT goals and tolerated gait distance of 30 ft with min A and RW. Pt is oriented to self only. Continue to progress as tolerated with anticipated d/c to SNF for further skilled PT services."    -2D echo showed EF of > 06%, mild diastolic dysfunction  Elevated troponins -Likely due to hypertensive urgency, demand ischemia, no chest pain or shortness of breath on exam -2D echo showed preserved EF and mild diastolic dysfunction   Hypertensive urgency -BP better controlled, continue atenolol.   Elevated LFTs -Mildly elevated AST,  -RUQ US showed normal gallbladder with no cholelithiasis, no intrahepatic biliary ductal dilatation  History of thyroidectomy, on thyroid hormone replacement therapy -Continue home Synthroid -Free T4 mildly elevated 1.3, TSH 16.0 -will need outpatient follow up with PCP and repeat labs    DVT Prophylaxis  Lovenox  Code Status: DNR  Family Communication: None at bedside.  Disposition Plan: Admitted.  COVID negative. Patient is 85years old with dementia and only oriented to self. She is not a safe discharge to home as she lives alone. [facility] has denied SNF placement. Waiting for Peer to peer. In the mean time, will reach out to family (only sister is listed) for other possible plans.  Consultants None  Procedures  None  Antibiotics   Anti-infectives (From admission, onward)   Start     Dose/Rate Route Frequency Ordered Stop   05/01/19 1245  cephALEXin (KEFLEX) capsule 500 mg     500 mg Oral Every 12 hours 05/01/19 1236     05/01/19 1100  ceFAZolin (ANCEF) IVPB 1 g/50 mL premix  Status:  Discontinued     1 g 100 mL/hr over 30 Minutes Intravenous Every 12 hours 05/01/19 1053 05/01/19 1235   04/29/19 1830  cefTRIAXone (ROCEPHIN) 1 g in sodium chloride 0.9 % 100 mL IVPB  Status:  Discontinued     1 g 200 mL/hr over 30 Minutes Intravenous Every 24 hours 04/28/19 2105 05/01/19 1052   04/28/19 1730  cefTRIAXone (ROCEPHIN) 1 g in sodium chloride 0.9 % 100 mL  IVPB     1 g 200 mL/hr over 30 Minutes Intravenous  Once 04/28/19 1726 04/28/19 1824      Subjective:   Patricia Campbell seen and examined today.  Patient with dementia. No complaints. Objective:   Vitals:   05/03/19 1303 05/03/19 2135 05/04/19 0610 05/04/19 0819  BP: (!) 152/68 (!) 110/54 133/63 (!) 159/72  Pulse: (!) 58 74 65 69  Resp: 17     Temp: 98.3 F (36.8 C) 98.1 F (36.7 C) 98 F (36.7 C)   TempSrc: Oral  Oral Oral   SpO2:  95% 97% 97%  Weight:      Height:        Intake/Output Summary (Last 24 hours) at 05/04/2019 1351 Last data filed at 05/04/2019 0845 Gross per 24 hour  Intake 820 ml  Output -  Net 820 ml   Filed Weights   04/28/19 1606  Weight: 40.8 kg   Exam  General: Well developed, well nourished, elderly, NAD  HEENT: NCAT, mucous membranes moist.   Cardiovascular: S1 S2 auscultated, RRR, no murmur   Respiratory: Clear to auscultation bilaterally   Abdomen: Soft, nontender, nondistended, + bowel sounds  Extremities: warm dry without cyanosis clubbing or edema  Neuro: AAOx1 (self only), nonfocal  Psych: Pleasantly confused  Data Reviewed: I have personally reviewed following labs and imaging studies  CBC: Recent Labs  Lab 04/28/19 1632  WBC 10.1  NEUTROABS 8.1*  HGB 12.7  HCT 41.0  MCV 94.0  PLT 094   Basic Metabolic Panel: Recent Labs  Lab 04/28/19 1632 04/30/19 0717 05/01/19 0459 05/02/19 0438  NA 140 138 139 140  K 3.7 3.6 4.0 4.1  CL 105 106 108 108  CO2 21* 24 23 25   GLUCOSE 89 88 94 93  BUN 18 31* 36* 26*  CREATININE 1.03* 0.95 0.92 0.72  CALCIUM 8.0* 8.1* 8.2* 8.3*  MG 2.1  --   --   --    GFR: Estimated Creatinine Clearance: 30.1 mL/min (by C-G formula based on SCr of 0.72 mg/dL). Liver Function Tests: Recent Labs  Lab 04/28/19 1632 04/29/19 1006  AST 55* 56*  ALT 24 24  ALKPHOS 58 53  BILITOT 1.3* 1.2  PROT 6.1* 6.1*  ALBUMIN 3.5 3.3*   Recent Labs  Lab 04/28/19 1632  LIPASE 20   Recent Labs  Lab 04/28/19 1633  AMMONIA 12   Coagulation Profile: No results for input(s): INR, PROTIME in the last 168 hours. Cardiac Enzymes: Recent Labs  Lab 04/28/19 1632 04/28/19 2223 04/29/19 0247 04/29/19 1006  TROPONINI 0.05* 0.29* 0.21* 0.14*   BNP (last 3 results) No results for input(s): PROBNP in the last 8760 hours. HbA1C: No results for input(s): HGBA1C in the last 72 hours. CBG: Recent Labs  Lab 04/28/19 1623  04/29/19 0749  GLUCAP 82 57*   Lipid Profile: No results for input(s): CHOL, HDL, LDLCALC, TRIG, CHOLHDL, LDLDIRECT in the last 72 hours. Thyroid Function Tests: No results for input(s): TSH, T4TOTAL, FREET4, T3FREE, THYROIDAB in the last 72 hours. Anemia Panel: No results for input(s): VITAMINB12, FOLATE, FERRITIN, TIBC, IRON, RETICCTPCT in the last 72 hours. Urine analysis:    Component Value Date/Time   COLORURINE YELLOW 04/28/2019 1632   APPEARANCEUR CLOUDY (A) 04/28/2019 1632   LABSPEC 1.011 04/28/2019 1632   PHURINE 6.0 04/28/2019 1632   GLUCOSEU NEGATIVE 04/28/2019 1632   HGBUR SMALL (A) 04/28/2019 1632   BILIRUBINUR NEGATIVE 04/28/2019 1632   BILIRUBINUR negative 03/30/2017 1641   BILIRUBINUR NEG  07/22/2015 1403   KETONESUR 20 (A) 04/28/2019 1632   PROTEINUR 30 (A) 04/28/2019 1632   UROBILINOGEN 0.2 03/30/2017 1641   NITRITE NEGATIVE 04/28/2019 1632   LEUKOCYTESUR LARGE (A) 04/28/2019 1632   Sepsis Labs: @LABRCNTIP (procalcitonin:4,lacticidven:4)  ) Recent Results (from the past 240 hour(s))  Urine culture     Status: Abnormal   Collection Time: 04/28/19  4:33 PM   Specimen: Urine, Random  Result Value Ref Range Status   Specimen Description URINE, RANDOM  Final   Special Requests   Final    NONE Performed at [facility], [place] [address], [place], [place] [address]    Culture >=100,000 COLONIES/mL ESCHERICHIA COLI (A)  Final   Report Status 04/30/2019 FINAL  Final   Organism ID, Bacteria ESCHERICHIA COLI (A)  Final      Susceptibility   Escherichia coli - MIC*    AMPICILLIN >=32 RESISTANT Resistant     CEFAZOLIN <=4 SENSITIVE Sensitive     CEFTRIAXONE <=1 SENSITIVE Sensitive     CIPROFLOXACIN <=0.25 SENSITIVE Sensitive     GENTAMICIN >=16 RESISTANT Resistant     IMIPENEM 0.5 SENSITIVE Sensitive     NITROFURANTOIN <=16 SENSITIVE Sensitive     TRIMETH/SULFA >=320 RESISTANT Resistant     AMPICILLIN/SULBACTAM 8 SENSITIVE Sensitive     PIP/TAZO <=4  SENSITIVE Sensitive     Extended ESBL NEGATIVE Sensitive     * >=100,000 COLONIES/mL ESCHERICHIA COLI  Novel Coronavirus,NAA,(SEND-OUT TO REF LAB - TAT 24-48 hrs); Hosp Order     Status: None   Collection Time: 04/28/19  6:50 PM   Specimen: Nasopharyngeal Swab; Respiratory  Result Value Ref Range Status   SARS-CoV-2, NAA NOT DETECTED NOT DETECTED Final    Comment: (NOTE) This test was developed and its performance characteristics determined by [employer]. This test has not been FDA cleared or approved. This test has been authorized by FDA under an Emergency Use Authorization (EUA). This test is only authorized for the duration of time the declaration that circumstances exist justifying the authorization of the emergency use of in vitro diagnostic tests for detection of SARS-CoV-2 virus and/or diagnosis of COVID-19 infection under section 564(b)(1) of the Act, 21 U.S.C. 916BWG-6(K)(5), unless the authorization is terminated or revoked sooner. When diagnostic testing is negative, the possibility of a false negative result should be considered in the context of a patient's recent exposures and the presence of clinical signs and symptoms consistent with COVID-19. An individual without symptoms of COVID-19 and who is not shedding SARS-CoV-2 virus would expect to have a negative (not detected) result in this assay. Performed  At: [facility] [address] [place], [place] [id number] Rush Farmer MD LT:[id number]    [place]  Final    Comment: Performed at [facility], [place] [address], [place], [place] [address]  Novel Coronavirus, NAA (hospital order; send-out to ref lab)     Status: None   Collection Time: 05/02/19  2:54 PM   Specimen: Nasopharyngeal Swab; Respiratory  Result Value Ref Range Status   SARS-CoV-2, NAA NOT DETECTED NOT DETECTED Final    Comment: (NOTE) This test was developed and its performance characteristics  determined by [employer]. This test has not been FDA cleared or approved. This test has been authorized by FDA under an Emergency Use Authorization (EUA). This test is only authorized for the duration of time the declaration that circumstances exist justifying the authorization of the emergency use of in vitro diagnostic tests for  detection of SARS-CoV-2 virus and/or diagnosis of COVID-19 infection under section 564(b)(1) of the Act, 21 U.S.C. 438VKF-8(M)(0), unless the authorization is terminated or revoked sooner. When diagnostic testing is negative, the possibility of a false negative result should be considered in the context of a patient's recent exposures and the presence of clinical signs and symptoms consistent with COVID-19. An individual without symptoms of COVID-19 and who is not shedding SARS-CoV-2 virus would expect to have a negative (not detected) result in this assay. Performed  At: [facility] [address] [place], [place] [id number] Rush Farmer MD PC:[id number]    [place]  Final    Comment: Performed at [facility], [place] [address], [place], [place] [address]      Radiology Studies: No results found.   Scheduled Meds: . aspirin EC  81 mg Oral Daily  . atenolol  25 mg Oral Daily  . cephALEXin  500 mg Oral Q12H  . enoxaparin (LOVENOX) injection  30 mg Subcutaneous Q24H  . levothyroxine  75 mcg Oral QAC breakfast  . mirtazapine  7.5 mg Oral QHS   Continuous Infusions:   LOS: 5 days   Time Spent in minutes   30 minutes  Kalim Kissel D.O. on 05/04/2019 at 1:51 PM  Between 7am to 7pm - Please see pager noted on [email or web address]  After 7pm go to [email or web address]  And look for the night coverage person covering for me after hours  [facility] Office  [phone]

## 2019-05-05 MED ORDER — ADULT MULTIVITAMIN W/MINERALS CH
1.0000 | ORAL_TABLET | Freq: Every day | ORAL | Status: DC
  Administered 2019-05-05 – 2019-05-11 (×7): 1 via ORAL
  Filled 2019-05-05 (×7): qty 1

## 2019-05-05 MED ORDER — ENSURE ENLIVE PO LIQD
237.0000 mL | Freq: Two times a day (BID) | ORAL | Status: DC
  Administered 2019-05-05 – 2019-05-11 (×10): 237 mL via ORAL

## 2019-05-05 NOTE — Progress Notes (Signed)
Initial Nutrition Assessment  DOCUMENTATION CODES:   Severe malnutrition in context of chronic illness, Underweight  INTERVENTION:   -Ensure Enlive po BID, each supplement provides 350 kcal and 20 grams of protein -MVI with minerals daily  NUTRITION DIAGNOSIS:   Severe Malnutrition related to chronic illness(thyroid cancer, dementia) as evidenced by moderate fat depletion, severe fat depletion, moderate muscle depletion, severe muscle depletion.  GOAL:   Patient will meet greater than or equal to 90% of their needs  MONITOR:   PO intake, Supplement acceptance, Labs, Weight trends, Skin, I & O's  REASON FOR ASSESSMENT:   Other (Comment)    ASSESSMENT:   Patricia Campbell is a 85 y.o. female with medical history significant of thyroid cancer status post thyroidectomy, degenerative disc disease, osteoporosis presenting to the hospital via EMS for evaluation of altered mental status and syncope.  Per his EMS report, patient lives alone at home and had a fall witnessed by family.  No loss of consciousness.  Upon arrival of the fire department, patient had a syncopal episode.  Hypotensive per EMS and a 500 cc bolus was given. Patient is confused and oriented to self only.  No history could be obtained from her.  No family available at this time.  Pt admitted with UTI and acute encephalopathy.   Reviewed I/O's: +650 ml x 24 hours and +2.1 L since admission  Spoke with pt at bedside, who was very pleasant and in good spirits. She reports good appetite and consumed all of her breakfast. Meal completion 60-100%. Pt reports that PTA she consumed 3 meals per day (Breakfast: cereal and eggs; lunch: sandwich, Dinner: meat, starch, and vegetable).   Pt denies any weight loss, but unsure of UBW. She shares she has always been small framed and petite. Noted pt has experienced a 5.2% wt loss over the past year, which is not significant for time frame. Noted progressive wt loss over the past 4 years.    Discussed with pt importance of good meal and supplement intake to promote healing. Pt amenable to Ensure supplements.   Per CSW notes, awaiting SNF placement on discharge.   Labs reviewed.   NUTRITION - FOCUSED PHYSICAL EXAM:    Most Recent Value  Orbital Region  Moderate depletion  Upper Arm Region  Severe depletion  Thoracic and Lumbar Region  Moderate depletion  Buccal Region  Moderate depletion  Temple Region  Moderate depletion  Clavicle Bone Region  Severe depletion  Clavicle and Acromion Bone Region  Moderate depletion  Scapular Bone Region  Moderate depletion  Dorsal Hand  Severe depletion  Patellar Region  Severe depletion  Anterior Thigh Region  Severe depletion  Posterior Calf Region  Severe depletion  Edema (RD Assessment)  None  Hair  Reviewed  Eyes  Reviewed  Mouth  Reviewed  Skin  Reviewed  Nails  Reviewed       Diet Order:   Diet Order            Diet Heart Room service appropriate? No; Fluid consistency: Thin  Diet effective now              EDUCATION NEEDS:   Education needs have been addressed  Skin:  Skin Assessment: Skin Integrity Issues: Skin Integrity Issues:: Stage I Stage I: sacrum  Last BM:  04/30/19  Height:   Ht Readings from Last 1 Encounters:  04/28/19 5\' 1"  (1.549 m)    Weight:   Wt Readings from Last 1 Encounters:  04/28/19 40.8  kg    Ideal Body Weight:  47.7 kg  BMI:  Body mass index is 17.01 kg/m.  Estimated Nutritional Needs:   Kcal:  1200-1400  Protein:  45-60 grams  Fluid:  > 1.2 L    Cherrise Occhipinti A. Jimmye Norman, RD, LDN, [place] Registered Dietitian II Certified Diabetes Care and Education Specialist Pager: [phone] After hours Pager: [phone]

## 2019-05-05 NOTE — TOC Progression Note (Signed)
Transition of Care [facility]) - Progression Note    Patient Details  Name: Patricia Campbell MRN: [id number] Date of Birth: 09-Jan-1927  [facility] CM/SW [place], LCSW Phone Number: 05/05/2019, 12:06 PM  Clinical Narrative:    CSW still awaiting appeal decision from [place].    Expected Discharge Plan: Skilled Nursing Facility Barriers to Discharge: Insurance Authorization  Expected Discharge Plan and Services Expected Discharge Plan: [place] In-house Referral: Clinical Social Work   Post Acute Care Choice: [place] Living arrangements for the past 2 months: Single Family Home                           HH Arranged: NA           Social Determinants of Health (SDOH) Interventions    Readmission Risk Interventions No flowsheet data found.

## 2019-05-05 NOTE — Progress Notes (Signed)
PROGRESS NOTE    Patricia Campbell  VVO:[id number] DOB: 10/18/1927 DOA: 04/28/2019 PCP: Rutherford Guys, MD   Brief Narrative:  HPI on 04/28/2019 by Dr. Shela Leff  Patricia Campbell is a 85 y.o. female with medical history significant of thyroid cancer status post thyroidectomy, degenerative disc disease, osteoporosis presenting to the hospital via EMS for evaluation of altered mental status and syncope.  Per his EMS report, patient lives alone at home and had a fall witnessed by family.  No loss of consciousness.  Upon arrival of the fire department, patient had a syncopal episode.  Hypotensive per EMS and a 500 cc bolus was given. Patient is confused and oriented to self only.  No history could be obtained from her.  No family available at this time. Assessment & Plan   E. coli urinary tract infection -Urine culture >100K EColi -Continue keflex   Acute metabolic encephalopathy superimposed on dementia -CT head with no acute findings, showed atrophy with chronic microvascular disease, likely has some underlying dementia.  No focal neurological deficits. -She still has waxing and waning mental status with sundowning.  Per sister, she is confused most of the time due to dementia.   -Continue low-dose Remeron.  Fall, syncope  -Patient presented with elevated BP, no seizure activity.  No chest pain or shortness of breath.  Troponins positive -PT evaluation recommended skilled nursing facility: "Patient seen for mobility progression. Pt is making progress toward PT goals and tolerated gait distance of 30 ft with min A and RW. Pt is oriented to self only. Continue to progress as tolerated with anticipated d/c to SNF for further skilled PT services."    -2D echo showed EF of > 26%, mild diastolic dysfunction  Elevated troponins -Likely due to hypertensive urgency, demand ischemia, no chest pain or shortness of breath on exam -2D echo showed preserved EF and mild diastolic dysfunction   Hypertensive urgency -BP better controlled, continue atenolol.   Elevated LFTs -Mildly elevated AST,  -RUQ US showed normal gallbladder with no cholelithiasis, no intrahepatic biliary ductal dilatation  History of thyroidectomy, on thyroid hormone replacement therapy -Continue home Synthroid -Free T4 mildly elevated 1.3, TSH 16.0 -will need outpatient follow up with PCP and repeat labs    DVT Prophylaxis  Lovenox  Code Status: DNR  Family Communication: None at bedside.  Disposition Plan: Admitted.  COVID negative. Patient is 85years old with dementia and only oriented to self. She is not a safe discharge to home as she lives alone. [facility] denied SNF placement. Discussed with [facility], will try to get CM and SW from [place] to discuss patient with our CM and SW to come up with a plan. Will look into starting medicaid application. [employer] representative agreed that patient was not a safe discharge, however did not feel patient was skillable and really needed long term care placement.  Consultants None  Procedures  None  Antibiotics   Anti-infectives (From admission, onward)   Start     Dose/Rate Route Frequency Ordered Stop   05/01/19 1245  cephALEXin (KEFLEX) capsule 500 mg     500 mg Oral Every 12 hours 05/01/19 1236     05/01/19 1100  ceFAZolin (ANCEF) IVPB 1 g/50 mL premix  Status:  Discontinued     1 g 100 mL/hr over 30 Minutes Intravenous Every 12 hours 05/01/19 1053 05/01/19 1235   04/29/19 1830  cefTRIAXone (ROCEPHIN) 1 g in sodium chloride 0.9 % 100 mL IVPB  Status:  Discontinued  1 g 200 mL/hr over 30 Minutes Intravenous Every 24 hours 04/28/19 2105 05/01/19 1052   04/28/19 1730  cefTRIAXone (ROCEPHIN) 1 g in sodium chloride 0.9 % 100 mL IVPB     1 g 200 mL/hr over 30 Minutes Intravenous  Once 04/28/19 1726 04/28/19 1824      Subjective:   Patricia Campbell seen and examined today.  Patient with dementia. No complaints. Objective:   Vitals:   05/04/19 0610  05/04/19 0819 05/04/19 2119 05/05/19 0640  BP: 133/63 (!) 159/72 (!) 136/51 (!) 117/54  Pulse: 65 69 73 67  Resp:   18 18  Temp: 98 F (36.7 C)  97.8 F (36.6 C) 98.3 F (36.8 C)  TempSrc: Oral  Oral Oral  SpO2: 97% 97% 95% 95%  Weight:      Height:        Intake/Output Summary (Last 24 hours) at 05/05/2019 1008 Last data filed at 05/04/2019 1500 Gross per 24 hour  Intake 300 ml  Output -  Net 300 ml   Filed Weights   04/28/19 1606  Weight: 40.8 kg   Exam (no change from 6/25)  General: Well developed, well nourished, elderly, NAD  HEENT: NCAT, mucous membranes moist.   Cardiovascular: S1 S2 auscultated, RRR, no murmur   Respiratory: Clear to auscultation bilaterally   Abdomen: Soft, nontender, nondistended, + bowel sounds  Extremities: warm dry without cyanosis clubbing or edema  Neuro: AAOx1 (self only), nonfocal  Psych: Pleasantly confused  Data Reviewed: I have personally reviewed following labs and imaging studies  CBC: Recent Labs  Lab 04/28/19 1632  WBC 10.1  NEUTROABS 8.1*  HGB 12.7  HCT 41.0  MCV 94.0  PLT 387   Basic Metabolic Panel: Recent Labs  Lab 04/28/19 1632 04/30/19 0717 05/01/19 0459 05/02/19 0438  NA 140 138 139 140  K 3.7 3.6 4.0 4.1  CL 105 106 108 108  CO2 21* 24 23 25   GLUCOSE 89 88 94 93  BUN 18 31* 36* 26*  CREATININE 1.03* 0.95 0.92 0.72  CALCIUM 8.0* 8.1* 8.2* 8.3*  MG 2.1  --   --   --    GFR: Estimated Creatinine Clearance: 30.1 mL/min (by C-G formula based on SCr of 0.72 mg/dL). Liver Function Tests: Recent Labs  Lab 04/28/19 1632 04/29/19 1006  AST 55* 56*  ALT 24 24  ALKPHOS 58 53  BILITOT 1.3* 1.2  PROT 6.1* 6.1*  ALBUMIN 3.5 3.3*   Recent Labs  Lab 04/28/19 1632  LIPASE 20   Recent Labs  Lab 04/28/19 1633  AMMONIA 12   Coagulation Profile: No results for input(s): INR, PROTIME in the last 168 hours. Cardiac Enzymes: Recent Labs  Lab 04/28/19 1632 04/28/19 2223 04/29/19 0247 04/29/19  1006  TROPONINI 0.05* 0.29* 0.21* 0.14*   BNP (last 3 results) No results for input(s): PROBNP in the last 8760 hours. HbA1C: No results for input(s): HGBA1C in the last 72 hours. CBG: Recent Labs  Lab 04/28/19 1623 04/29/19 0749  GLUCAP 82 57*   Lipid Profile: No results for input(s): CHOL, HDL, LDLCALC, TRIG, CHOLHDL, LDLDIRECT in the last 72 hours. Thyroid Function Tests: No results for input(s): TSH, T4TOTAL, FREET4, T3FREE, THYROIDAB in the last 72 hours. Anemia Panel: No results for input(s): VITAMINB12, FOLATE, FERRITIN, TIBC, IRON, RETICCTPCT in the last 72 hours. Urine analysis:    Component Value Date/Time   COLORURINE YELLOW 04/28/2019 1632   APPEARANCEUR CLOUDY (A) 04/28/2019 1632   LABSPEC 1.011 04/28/2019 1632  PHURINE 6.0 04/28/2019 1632   GLUCOSEU NEGATIVE 04/28/2019 1632   HGBUR SMALL (A) 04/28/2019 1632   BILIRUBINUR NEGATIVE 04/28/2019 1632   BILIRUBINUR negative 03/30/2017 1641   BILIRUBINUR NEG 07/22/2015 1403   KETONESUR 20 (A) 04/28/2019 1632   PROTEINUR 30 (A) 04/28/2019 1632   UROBILINOGEN 0.2 03/30/2017 1641   NITRITE NEGATIVE 04/28/2019 1632   LEUKOCYTESUR LARGE (A) 04/28/2019 1632   Sepsis Labs: @LABRCNTIP (procalcitonin:4,lacticidven:4)  ) Recent Results (from the past 240 hour(s))  Urine culture     Status: Abnormal   Collection Time: 04/28/19  4:33 PM   Specimen: Urine, Random  Result Value Ref Range Status   Specimen Description URINE, RANDOM  Final   Special Requests   Final    NONE Performed at [facility], [place] [address], [place], [place] [address]    Culture >=100,000 COLONIES/mL ESCHERICHIA COLI (A)  Final   Report Status 04/30/2019 FINAL  Final   Organism ID, Bacteria ESCHERICHIA COLI (A)  Final      Susceptibility   Escherichia coli - MIC*    AMPICILLIN >=32 RESISTANT Resistant     CEFAZOLIN <=4 SENSITIVE Sensitive     CEFTRIAXONE <=1 SENSITIVE Sensitive     CIPROFLOXACIN <=0.25 SENSITIVE Sensitive      GENTAMICIN >=16 RESISTANT Resistant     IMIPENEM 0.5 SENSITIVE Sensitive     NITROFURANTOIN <=16 SENSITIVE Sensitive     TRIMETH/SULFA >=320 RESISTANT Resistant     AMPICILLIN/SULBACTAM 8 SENSITIVE Sensitive     PIP/TAZO <=4 SENSITIVE Sensitive     Extended ESBL NEGATIVE Sensitive     * >=100,000 COLONIES/mL ESCHERICHIA COLI  Novel Coronavirus,NAA,(SEND-OUT TO REF LAB - TAT 24-48 hrs); Hosp Order     Status: None   Collection Time: 04/28/19  6:50 PM   Specimen: Nasopharyngeal Swab; Respiratory  Result Value Ref Range Status   SARS-CoV-2, NAA NOT DETECTED NOT DETECTED Final    Comment: (NOTE) This test was developed and its performance characteristics determined by [employer]. This test has not been FDA cleared or approved. This test has been authorized by FDA under an Emergency Use Authorization (EUA). This test is only authorized for the duration of time the declaration that circumstances exist justifying the authorization of the emergency use of in vitro diagnostic tests for detection of SARS-CoV-2 virus and/or diagnosis of COVID-19 infection under section 564(b)(1) of the Act, 21 U.S.C. 528UXL-2(G)(4), unless the authorization is terminated or revoked sooner. When diagnostic testing is negative, the possibility of a false negative result should be considered in the context of a patient's recent exposures and the presence of clinical signs and symptoms consistent with COVID-19. An individual without symptoms of COVID-19 and who is not shedding SARS-CoV-2 virus would expect to have a negative (not detected) result in this assay. Performed  At: [facility] [address] [place], [place] [id number] Rush Farmer MD UY:[id number]    [place]  Final    Comment: Performed at [facility], [place] [address], [place],  [address]  Novel Coronavirus, NAA (hospital order; send-out to ref lab)     Status: None   Collection Time:  05/02/19  2:54 PM   Specimen: Nasopharyngeal Swab; Respiratory  Result Value Ref Range Status   SARS-CoV-2, NAA NOT DETECTED NOT DETECTED Final    Comment: (NOTE) This test was developed and its performance characteristics determined by [employer]. This test has not been FDA cleared or approved. This test has been authorized by FDA under  an Emergency Use Authorization (EUA). This test is only authorized for the duration of time the declaration that circumstances exist justifying the authorization of the emergency use of in vitro diagnostic tests for detection of SARS-CoV-2 virus and/or diagnosis of COVID-19 infection under section 564(b)(1) of the Act, 21 U.S.C. 919TYO-0(A)(0), unless the authorization is terminated or revoked sooner. When diagnostic testing is negative, the possibility of a false negative result should be considered in the context of a patient's recent exposures and the presence of clinical signs and symptoms consistent with COVID-19. An individual without symptoms of COVID-19 and who is not shedding SARS-CoV-2 virus would expect to have a negative (not detected) result in this assay. Performed  At: [facility] [address] [place], [place] [id number] Rush Farmer MD SE:[id number]    [place]  Final    Comment: Performed at [facility], [place] [address], [place], [place] [address]      Radiology Studies: No results found.   Scheduled Meds: . aspirin EC  81 mg Oral Daily  . atenolol  25 mg Oral Daily  . cephALEXin  500 mg Oral Q12H  . enoxaparin (LOVENOX) injection  30 mg Subcutaneous Q24H  . levothyroxine  75 mcg Oral QAC breakfast  . mirtazapine  7.5 mg Oral QHS   Continuous Infusions:   LOS: 6 days   Time Spent in minutes   30 minutes  Anay Walter D.O. on 05/05/2019 at 10:08 AM  Between 7am to 7pm - Please see pager noted on [email or web address]  After 7pm go to [email or web address]  And look for the night  coverage person covering for me after hours  [facility] Office  [phone]

## 2019-05-05 NOTE — TOC Progression Note (Signed)
Transition of Care [facility]) - Progression Note    Patient Details  Name: Patricia Campbell MRN: [id number] Date of Birth: 09-01-1927  Transition of Care [facility]) CM/SW [place], LCSW Phone Number: 05/05/2019, 3:01 PM  Clinical Narrative:    CSW updated Financial Counseling that patient is confused and may be unable to sign forms for Medicaid application. FC will follow up.    Expected Discharge Plan: Skilled Nursing Facility Barriers to Discharge: Insurance Authorization  Expected Discharge Plan and Services Expected Discharge Plan: [place] In-house Referral: Clinical Social Work   Post Acute Care Choice: [place] Living arrangements for the past 2 months: Single Family Home                           HH Arranged: NA           Social Determinants of Health (SDOH) Interventions    Readmission Risk Interventions No flowsheet data found.

## 2019-05-05 NOTE — Progress Notes (Signed)
Physical Therapy Treatment Patient Details Name: Patricia Campbell MRN: [id number] DOB: [birth year] Today's Date: 05/05/2019    History of Present Illness Patient is a 85 year old women. She rpesented to the hospital after a witnessed fall by her family. Per note she has been having an altered mental status.  PMH: Thyroid disease, scoliosis, DDD of the lumbar and thhoriacic spine;    PT Comments    Patient remains confused. She requires assistance and guarding for safety. She was able to make it to the bathroom and back but required frequent cuing for safety. She required min a to get into the bed. Patient would benefit from further skilled therapy. She would benefit most from rehab at a SNF to improve safety.    Follow Up Recommendations  SNF     Equipment Recommendations  Other (comment)    Recommendations for Other Services Rehab consult     Precautions / Restrictions Restrictions Weight Bearing Restrictions: No    Mobility  Bed Mobility Overal bed mobility: Needs Assistance Bed Mobility: Supine to Sit       Sit to supine: Supervision;Min assist   General bed mobility comments: increased Min a into bed. Min a to slide up in bed ime and effort; cues for follow through of task; use of rails   Transfers Overall transfer level: Needs assistance Equipment used: Rolling walker (2 wheeled) Transfers: Sit to/from Stand Sit to Stand: Min assist         General transfer comment: min a for stability. Cuing for safety standing from the commode. Patient kept letting go of the walker to adjust her gown.   Ambulation/Gait Ambulation/Gait assistance: Min assist Gait Distance (Feet): 12 Feet(x2)   Gait Pattern/deviations: Step-through pattern;Trunk flexed;Decreased stride length Gait velocity: decreased Gait velocity interpretation: <1.31 ft/sec, indicative of household ambulator General Gait Details: cues for upright posture and safe use of AD; assist to steady     Stairs             Wheelchair Mobility    Modified Rankin (Stroke Patients Only) Modified Rankin (Stroke Patients Only) Pre-Morbid Rankin Score: No symptoms Modified Rankin: No symptoms     Balance Overall balance assessment: Needs assistance Sitting-balance support: Feet supported Sitting balance-Leahy Scale: Fair Sitting balance - Comments: gaurding required eob      Standing balance-Leahy Scale: Poor Standing balance comment: UE support and at a minimum minguard for balance                            Cognition Arousal/Alertness: Awake/alert Behavior During Therapy: WFL for tasks assessed/performed Overall Cognitive Status: No family/caregiver present to determine baseline cognitive functioning Area of Impairment: Orientation;Memory;Safety/judgement;Problem solving                 Orientation Level: Disoriented to;Place;Time;Situation   Memory: Decreased short-term memory   Safety/Judgement: Decreased awareness of safety;Decreased awareness of deficits   Problem Solving: Difficulty sequencing;Requires verbal cues General Comments: max cuing and education about getting out of the chair. patient unsure what the noise is ( chair alarm       Exercises      General Comments        Pertinent Vitals/Pain Pain Assessment: No/denies pain    Home Living                      Prior Function            PT Goals (  current goals can now be found in the care plan section) Acute Rehab PT Goals PT Goal Formulation: With patient Time For Goal Achievement: 05/06/19 Potential to Achieve Goals: Fair Progress towards PT goals: Progressing toward goals    Frequency    Min 3X/week      PT Plan Current plan remains appropriate    Co-evaluation              AM-PAC PT "6 Clicks" Mobility   Outcome Measure  Help needed turning from your back to your side while in a flat bed without using bedrails?: A Little Help needed moving from lying on  your back to sitting on the side of a flat bed without using bedrails?: A Little Help needed moving to and from a bed to a chair (including a wheelchair)?: A Little Help needed standing up from a chair using your arms (e.g., wheelchair or bedside chair)?: A Little Help needed to walk in hospital room?: A Little Help needed climbing 3-5 steps with a railing? : A Lot 6 Click Score: 17    End of Session Equipment Utilized During Treatment: Gait belt Activity Tolerance: Patient tolerated treatment well Patient left: in chair;with call bell/phone within reach;with chair alarm set Nurse Communication: Mobility status PT Visit Diagnosis: Unsteadiness on feet (R26.81);Other abnormalities of gait and mobility (R26.89);Muscle weakness (generalized) (M62.81)     Time: 1047-1100 PT Time Calculation (min) (ACUTE ONLY): 13 min  Charges:  $Therapeutic Activity: 8-22 mins                        Carney Living PT DPT  05/05/2019, 12:47 PM

## 2019-05-06 NOTE — Progress Notes (Signed)
Pt was unable to tell me who to call for family today.  Noone listed on the chart that I could find.

## 2019-05-06 NOTE — Progress Notes (Signed)
PROGRESS NOTE    [name]  JSH:[id number] DOB: [birth year] DOA: 04/28/2019 PCP: Rutherford Guys, MD   Brief Narrative:  HPI on 04/28/2019 by Dr. Shela Leff  Patricia Campbell is a 85 y.o. female with medical history significant of thyroid cancer status post thyroidectomy, degenerative disc disease, osteoporosis presenting to the hospital via EMS for evaluation of altered mental status and syncope.  Per his EMS report, patient lives alone at home and had a fall witnessed by family.  No loss of consciousness.  Upon arrival of the fire department, patient had a syncopal episode.  Hypotensive per EMS and a 500 cc bolus was given. Patient is confused and oriented to self only.  No history could be obtained from her.  No family available at this time. Assessment & Plan   E. coli urinary tract infection -Urine culture >100K EColi -Continue keflex, possibly discontinue 8/50/2774  Acute metabolic encephalopathy superimposed on dementia -CT head with no acute findings, showed atrophy with chronic microvascular disease, likely has some underlying dementia.  No focal neurological deficits. -She still has waxing and waning mental status with sundowning.  Per sister, she is confused most of the time due to dementia.   -Continue low-dose Remeron.  Fall, syncope  -Patient presented with elevated BP, no seizure activity.  No chest pain or shortness of breath.  Troponins positive -PT evaluation recommended skilled nursing facility: "Patient seen for mobility progression. Pt is making progress toward PT goals and tolerated gait distance of 30 ft with min A and RW. Pt is oriented to self only. Continue to progress as tolerated with anticipated d/c to SNF for further skilled PT services."    -2D echo showed EF of > 12%, mild diastolic dysfunction  Elevated troponins -Likely due to hypertensive urgency, demand ischemia, no chest pain or shortness of breath on exam -2D echo showed preserved EF and mild  diastolic dysfunction  Hypertensive urgency -BP better controlled, continue atenolol.   Elevated LFTs -Mildly elevated AST,  -RUQ US showed normal gallbladder with no cholelithiasis, no intrahepatic biliary ductal dilatation  History of thyroidectomy, on thyroid hormone replacement therapy -Continue home Synthroid -Free T4 mildly elevated 1.3, TSH 16.0 -will need outpatient follow up with PCP and repeat labs   DVT Prophylaxis  Lovenox  Code Status: DNR  Family Communication: None at bedside.  Disposition Plan: Admitted.  COVID negative. Patient is 85years old with dementia and only oriented to self. She is not a safe discharge to home as she lives alone. [facility] has denied SNF placement. Discussed with [facility], will try to get CM and SW from [place] to discuss patient with our CM and SW to come up with a plan. Will look into starting medicaid application. [employer] representative agreed that patient was not a safe discharge, however did not feel patient was skillable and really needed long term care placement.  Consultants None  Procedures  None  Antibiotics   Anti-infectives (From admission, onward)   Start     Dose/Rate Route Frequency Ordered Stop   05/01/19 1245  cephALEXin (KEFLEX) capsule 500 mg     500 mg Oral Every 12 hours 05/01/19 1236     05/01/19 1100  ceFAZolin (ANCEF) IVPB 1 g/50 mL premix  Status:  Discontinued     1 g 100 mL/hr over 30 Minutes Intravenous Every 12 hours 05/01/19 1053 05/01/19 1235   04/29/19 1830  cefTRIAXone (ROCEPHIN) 1 g in sodium chloride 0.9 % 100 mL IVPB  Status:  Discontinued  1 g 200 mL/hr over 30 Minutes Intravenous Every 24 hours 04/28/19 2105 05/01/19 1052   04/28/19 1730  cefTRIAXone (ROCEPHIN) 1 g in sodium chloride 0.9 % 100 mL IVPB     1 g 200 mL/hr over 30 Minutes Intravenous  Once 04/28/19 1726 04/28/19 1824      Subjective:   Patricia Campbell seen and examined today.  Patient with dementia. No complaints. Objective:   Vitals:    05/05/19 0640 05/05/19 1245 05/05/19 2250 05/06/19 0558  BP: (!) 117/54 (!) 122/58 135/71 121/61  Pulse: 67 64 72 65  Resp: 18 16 17 17   Temp: 98.3 F (36.8 C) 98.3 F (36.8 C) 98.2 F (36.8 C) 97.8 F (36.6 C)  TempSrc: Oral Oral Oral   SpO2: 95% 98% 94% 94%  Weight:      Height:        Intake/Output Summary (Last 24 hours) at 05/06/2019 1049 Last data filed at 05/06/2019 7408 Gross per 24 hour  Intake 240 ml  Output -  Net 240 ml   Filed Weights   04/28/19 1606  Weight: 40.8 kg   Exam  General: Well developed, well nourished, elderly, NAD  HEENT: NCAT, mucous membranes moist.   Cardiovascular: S1 S2 auscultated, RRR, no murmur  Respiratory: Clear to auscultation bilaterally   Abdomen: Soft, nontender, nondistended, + bowel sounds  Extremities: warm dry without cyanosis clubbing or edema  Neuro: AAOx1 (self), nonfocal  Psych: Pleasantly confused   Data Reviewed: I have personally reviewed following labs and imaging studies  CBC: No results for input(s): WBC, NEUTROABS, HGB, HCT, MCV, PLT in the last 168 hours. Basic Metabolic Panel: Recent Labs  Lab 04/30/19 0717 05/01/19 0459 05/02/19 0438  NA 138 139 140  K 3.6 4.0 4.1  CL 106 108 108  CO2 24 23 25   GLUCOSE 88 94 93  BUN 31* 36* 26*  CREATININE 0.95 0.92 0.72  CALCIUM 8.1* 8.2* 8.3*   GFR: Estimated Creatinine Clearance: 30.1 mL/min (by C-G formula based on SCr of 0.72 mg/dL). Liver Function Tests: No results for input(s): AST, ALT, ALKPHOS, BILITOT, PROT, ALBUMIN in the last 168 hours. No results for input(s): LIPASE, AMYLASE in the last 168 hours. No results for input(s): AMMONIA in the last 168 hours. Coagulation Profile: No results for input(s): INR, PROTIME in the last 168 hours. Cardiac Enzymes: No results for input(s): CKTOTAL, CKMB, CKMBINDEX, TROPONINI in the last 168 hours. BNP (last 3 results) No results for input(s): PROBNP in the last 8760 hours. HbA1C: No results for  input(s): HGBA1C in the last 72 hours. CBG: No results for input(s): GLUCAP in the last 168 hours. Lipid Profile: No results for input(s): CHOL, HDL, LDLCALC, TRIG, CHOLHDL, LDLDIRECT in the last 72 hours. Thyroid Function Tests: No results for input(s): TSH, T4TOTAL, FREET4, T3FREE, THYROIDAB in the last 72 hours. Anemia Panel: No results for input(s): VITAMINB12, FOLATE, FERRITIN, TIBC, IRON, RETICCTPCT in the last 72 hours. Urine analysis:    Component Value Date/Time   COLORURINE YELLOW 04/28/2019 1632   APPEARANCEUR CLOUDY (A) 04/28/2019 1632   LABSPEC 1.011 04/28/2019 1632   PHURINE 6.0 04/28/2019 1632   GLUCOSEU NEGATIVE 04/28/2019 1632   HGBUR SMALL (A) 04/28/2019 1632   BILIRUBINUR NEGATIVE 04/28/2019 1632   BILIRUBINUR negative 03/30/2017 1641   BILIRUBINUR NEG 07/22/2015 1403   KETONESUR 20 (A) 04/28/2019 1632   PROTEINUR 30 (A) 04/28/2019 1632   UROBILINOGEN 0.2 03/30/2017 1641   NITRITE NEGATIVE 04/28/2019 1632   LEUKOCYTESUR LARGE (A)  04/28/2019 1632   Sepsis Labs: @LABRCNTIP (procalcitonin:4,lacticidven:4)  ) Recent Results (from the past 240 hour(s))  Urine culture     Status: Abnormal   Collection Time: 04/28/19  4:33 PM   Specimen: Urine, Random  Result Value Ref Range Status   Specimen Description URINE, RANDOM  Final   Special Requests   Final    NONE Performed at [facility], [place] [address] [place], [place] [address]    Culture >=100,000 COLONIES/mL ESCHERICHIA COLI (A)  Final   Report Status 04/30/2019 FINAL  Final   Organism ID, Bacteria ESCHERICHIA COLI (A)  Final      Susceptibility   Escherichia coli - MIC*    AMPICILLIN >=32 RESISTANT Resistant     CEFAZOLIN <=4 SENSITIVE Sensitive     CEFTRIAXONE <=1 SENSITIVE Sensitive     CIPROFLOXACIN <=0.25 SENSITIVE Sensitive     GENTAMICIN >=16 RESISTANT Resistant     IMIPENEM 0.5 SENSITIVE Sensitive     NITROFURANTOIN <=16 SENSITIVE Sensitive     TRIMETH/SULFA >=320 RESISTANT Resistant      AMPICILLIN/SULBACTAM 8 SENSITIVE Sensitive     PIP/TAZO <=4 SENSITIVE Sensitive     Extended ESBL NEGATIVE Sensitive     * >=100,000 COLONIES/mL ESCHERICHIA COLI  Novel Coronavirus,NAA,(SEND-OUT TO REF LAB - TAT 24-48 hrs); Hosp Order     Status: None   Collection Time: 04/28/19  6:50 PM   Specimen: Nasopharyngeal Swab; Respiratory  Result Value Ref Range Status   SARS-CoV-2, NAA NOT DETECTED NOT DETECTED Final    Comment: (NOTE) This test was developed and its performance characteristics determined by [employer]. This test has not been FDA cleared or approved. This test has been authorized by FDA under an Emergency Use Authorization (EUA). This test is only authorized for the duration of time the declaration that circumstances exist justifying the authorization of the emergency use of in vitro diagnostic tests for detection of SARS-CoV-2 virus and/or diagnosis of COVID-19 infection under section 564(b)(1) of the Act, 21 U.S.C. 295JOA-4(Z)(6), unless the authorization is terminated or revoked sooner. When diagnostic testing is negative, the possibility of a false negative result should be considered in the context of a patient's recent exposures and the presence of clinical signs and symptoms consistent with COVID-19. An individual without symptoms of COVID-19 and who is not shedding SARS-CoV-2 virus would expect to have a negative (not detected) result in this assay. Performed  At: [facility] [address] [place], [place] [id number] Rush Farmer MD UX:[id number]    [place]  Final    Comment: Performed at [facility], [place] [address], [place], [place] [address]  Novel Coronavirus, NAA (hospital order; send-out to ref lab)     Status: None   Collection Time: 05/02/19  2:54 PM   Specimen: Nasopharyngeal Swab; Respiratory  Result Value Ref Range Status   SARS-CoV-2, NAA NOT DETECTED NOT DETECTED Final    Comment: (NOTE) This  test was developed and its performance characteristics determined by [employer]. This test has not been FDA cleared or approved. This test has been authorized by FDA under an Emergency Use Authorization (EUA). This test is only authorized for the duration of time the declaration that circumstances exist justifying the authorization of the emergency use of in vitro diagnostic tests for detection of SARS-CoV-2 virus and/or diagnosis of COVID-19 infection under section 564(b)(1) of the Act, 21 U.S.C. 062BJS-2(G)(3), unless the authorization is terminated or revoked sooner. When diagnostic testing is negative, the possibility  of a false negative result should be considered in the context of a patient's recent exposures and the presence of clinical signs and symptoms consistent with COVID-19. An individual without symptoms of COVID-19 and who is not shedding SARS-CoV-2 virus would expect to have a negative (not detected) result in this assay. Performed  At: [facility] [address] [place], [place] [id number] Rush Farmer MD FA:[id number]    [place]  Final    Comment: Performed at [facility], [place] [address] [place], [place] [address]      Radiology Studies: No results found.   Scheduled Meds: . aspirin EC  81 mg Oral Daily  . atenolol  25 mg Oral Daily  . cephALEXin  500 mg Oral Q12H  . enoxaparin (LOVENOX) injection  30 mg Subcutaneous Q24H  . feeding supplement (ENSURE ENLIVE)  237 mL Oral BID BM  . levothyroxine  75 mcg Oral QAC breakfast  . mirtazapine  7.5 mg Oral QHS  . multivitamin with minerals  1 tablet Oral Daily   Continuous Infusions:   LOS: 7 days   Time Spent in minutes   30 minutes  Baruch Lewers D.O. on 05/06/2019 at 10:49 AM  Between 7am to 7pm - Please see pager noted on [email or web address]  After 7pm go to [email or web address]  And look for the night coverage person covering for me after hours  [facility] Office  [phone]

## 2019-05-07 NOTE — Progress Notes (Signed)
When pt walks to the bathroom, she sometimes holds walker and sometimes doesn't.  She stumbles over floor mat, which is required per hospital policy.  Pt misjudges when she goes to sit on commode, bed or chair. She requires assistance with guidance.

## 2019-05-07 NOTE — Progress Notes (Addendum)
PROGRESS NOTE    Patricia Campbell  HQI:[id number] DOB: 1926-11-27 DOA: 04/28/2019 PCP: Rutherford Guys, MD   Brief Narrative:  HPI on 04/28/2019 by Dr. Shela Leff  Patricia Campbell is a 85 y.o. female with medical history significant of thyroid cancer status post thyroidectomy, degenerative disc disease, osteoporosis presenting to the hospital via EMS for evaluation of altered mental status and syncope.  Per his EMS report, patient lives alone at home and had a fall witnessed by family.  No loss of consciousness.  Upon arrival of the fire department, patient had a syncopal episode.  Hypotensive per EMS and a 500 cc bolus was given. Patient is confused and oriented to self only.  No history could be obtained from her.  No family available at this time. Assessment & Plan   E. coli urinary tract infection -Urine culture >100K EColi -Completed antibiotics (Keflex)  Acute metabolic encephalopathy superimposed on dementia -CT head with no acute findings, showed atrophy with chronic microvascular disease, likely has some underlying dementia.  No focal neurological deficits. -She still has waxing and waning mental status with sundowning.  Per sister, she is confused most of the time due to dementia.   -Continue low-dose Remeron.  Fall, syncope  -Patient presented with elevated BP, no seizure activity.  No chest pain or shortness of breath.  Troponins positive -PT evaluation recommended skilled nursing facility: "Patient seen for mobility progression. Pt is making progress toward PT goals and tolerated gait distance of 30 ft with min A and RW. Pt is oriented to self only. Continue to progress as tolerated with anticipated d/c to SNF for further skilled PT services."    -2D echo showed EF of > 13%, mild diastolic dysfunction  Elevated troponins -Likely due to hypertensive urgency, demand ischemia, no chest pain or shortness of breath on exam -2D echo showed preserved EF and mild diastolic  dysfunction  Hypertensive urgency -BP better controlled, continue atenolol.   Elevated LFTs -Mildly elevated AST -RUQ US showed normal gallbladder with no cholelithiasis, no intrahepatic biliary ductal dilatation  History of thyroidectomy, on thyroid hormone replacement therapy -Continue home Synthroid -Free T4 mildly elevated 1.3, TSH 16.0 -will need outpatient follow up with PCP and repeat labs   Severe malnutrition -in the context of chronic illness -Nutrition consulted -continue feeding supplementation  DVT Prophylaxis  Lovenox  Code Status: DNR  Family Communication: None at bedside.  Disposition Plan: Admitted.  COVID negative. Patient is 85years old with dementia and only oriented to self. She is not a safe discharge to home as she lives alone. [facility] denied SNF placement. Discussed with [facility], will try to get CM and SW from [place] to discuss patient with our CM and SW to come up with a plan. Starting [facility] application. [employer] representative agreed that patient was not a safe discharge, however did not feel patient was skillable and really needed long term care placement.  Consultants None  Procedures  None  Antibiotics   Anti-infectives (From admission, onward)   Start     Dose/Rate Route Frequency Ordered Stop   05/01/19 1245  cephALEXin (KEFLEX) capsule 500 mg     500 mg Oral Every 12 hours 05/01/19 1236     05/01/19 1100  ceFAZolin (ANCEF) IVPB 1 g/50 mL premix  Status:  Discontinued     1 g 100 mL/hr over 30 Minutes Intravenous Every 12 hours 05/01/19 1053 05/01/19 1235   04/29/19 1830  cefTRIAXone (ROCEPHIN) 1 g in sodium chloride 0.9 %  100 mL IVPB  Status:  Discontinued     1 g 200 mL/hr over 30 Minutes Intravenous Every 24 hours 04/28/19 2105 05/01/19 1052   04/28/19 1730  cefTRIAXone (ROCEPHIN) 1 g in sodium chloride 0.9 % 100 mL IVPB     1 g 200 mL/hr over 30 Minutes Intravenous  Once 04/28/19 1726 04/28/19 1824      Subjective:   Anjalee  Burridge seen and examined today.  Patient with dementia. No complaints. Objective:   Vitals:   05/06/19 0558 05/06/19 1452 05/06/19 2202 05/07/19 0442  BP: 121/61 (!) 132/57 (!) 128/53 (!) 151/68  Pulse: 65 73 73 66  Resp: 17 16 18 18   Temp: 97.8 F (36.6 C) 98.4 F (36.9 C) 98.4 F (36.9 C) 97.8 F (36.6 C)  TempSrc:  Oral Oral   SpO2: 94% 95% 95% 97%  Weight:      Height:        Intake/Output Summary (Last 24 hours) at 05/07/2019 1027 Last data filed at 05/07/2019 0800 Gross per 24 hour  Intake 1020 ml  Output -  Net 1020 ml   Filed Weights   04/28/19 1606  Weight: 40.8 kg   Exam  General: Well developed, elderly, NAD  HEENT: NCAT, mucous membranes moist.   Cardiovascular: S1 S2 auscultated, RRR, no murmur  Respiratory: Clear to auscultation bilaterally  Abdomen: Soft, nontender, nondistended, + bowel sounds  Extremities: warm dry without cyanosis clubbing or edema  Neuro: AAOx1 (self), nonfocal  Psych: Pleasantly confused  Data Reviewed: I have personally reviewed following labs and imaging studies  CBC: No results for input(s): WBC, NEUTROABS, HGB, HCT, MCV, PLT in the last 168 hours. Basic Metabolic Panel: Recent Labs  Lab 05/01/19 0459 05/02/19 0438  NA 139 140  K 4.0 4.1  CL 108 108  CO2 23 25  GLUCOSE 94 93  BUN 36* 26*  CREATININE 0.92 0.72  CALCIUM 8.2* 8.3*   GFR: Estimated Creatinine Clearance: 30.1 mL/min (by C-G formula based on SCr of 0.72 mg/dL). Liver Function Tests: No results for input(s): AST, ALT, ALKPHOS, BILITOT, PROT, ALBUMIN in the last 168 hours. No results for input(s): LIPASE, AMYLASE in the last 168 hours. No results for input(s): AMMONIA in the last 168 hours. Coagulation Profile: No results for input(s): INR, PROTIME in the last 168 hours. Cardiac Enzymes: No results for input(s): CKTOTAL, CKMB, CKMBINDEX, TROPONINI in the last 168 hours. BNP (last 3 results) No results for input(s): PROBNP in the last 8760 hours.  HbA1C: No results for input(s): HGBA1C in the last 72 hours. CBG: No results for input(s): GLUCAP in the last 168 hours. Lipid Profile: No results for input(s): CHOL, HDL, LDLCALC, TRIG, CHOLHDL, LDLDIRECT in the last 72 hours. Thyroid Function Tests: No results for input(s): TSH, T4TOTAL, FREET4, T3FREE, THYROIDAB in the last 72 hours. Anemia Panel: No results for input(s): VITAMINB12, FOLATE, FERRITIN, TIBC, IRON, RETICCTPCT in the last 72 hours. Urine analysis:    Component Value Date/Time   COLORURINE YELLOW 04/28/2019 1632   APPEARANCEUR CLOUDY (A) 04/28/2019 1632   LABSPEC 1.011 04/28/2019 1632   PHURINE 6.0 04/28/2019 1632   GLUCOSEU NEGATIVE 04/28/2019 1632   HGBUR SMALL (A) 04/28/2019 1632   BILIRUBINUR NEGATIVE 04/28/2019 1632   BILIRUBINUR negative 03/30/2017 1641   BILIRUBINUR NEG 07/22/2015 1403   KETONESUR 20 (A) 04/28/2019 1632   PROTEINUR 30 (A) 04/28/2019 1632   UROBILINOGEN 0.2 03/30/2017 1641   NITRITE NEGATIVE 04/28/2019 1632   LEUKOCYTESUR LARGE (A) 04/28/2019 1632  Sepsis Labs: @LABRCNTIP (procalcitonin:4,lacticidven:4)  ) Recent Results (from the past 240 hour(s))  Urine culture     Status: Abnormal   Collection Time: 04/28/19  4:33 PM   Specimen: Urine, Random  Result Value Ref Range Status   Specimen Description URINE, RANDOM  Final   Special Requests   Final    NONE Performed at [facility], [address], [place], [place] [address]    Culture >=100,000 COLONIES/mL ESCHERICHIA COLI (A)  Final   Report Status 04/30/2019 FINAL  Final   Organism ID, Bacteria ESCHERICHIA COLI (A)  Final      Susceptibility   Escherichia coli - MIC*    AMPICILLIN >=32 RESISTANT Resistant     CEFAZOLIN <=4 SENSITIVE Sensitive     CEFTRIAXONE <=1 SENSITIVE Sensitive     CIPROFLOXACIN <=0.25 SENSITIVE Sensitive     GENTAMICIN >=16 RESISTANT Resistant     IMIPENEM 0.5 SENSITIVE Sensitive     NITROFURANTOIN <=16 SENSITIVE Sensitive     TRIMETH/SULFA >=320  RESISTANT Resistant     AMPICILLIN/SULBACTAM 8 SENSITIVE Sensitive     PIP/TAZO <=4 SENSITIVE Sensitive     Extended ESBL NEGATIVE Sensitive     * >=100,000 COLONIES/mL ESCHERICHIA COLI  Novel Coronavirus,NAA,(SEND-OUT TO REF LAB - TAT 24-48 hrs); Hosp Order     Status: None   Collection Time: 04/28/19  6:50 PM   Specimen: Nasopharyngeal Swab; Respiratory  Result Value Ref Range Status   SARS-CoV-2, NAA NOT DETECTED NOT DETECTED Final    Comment: (NOTE) This test was developed and its performance characteristics determined by [employer]. This test has not been FDA cleared or approved. This test has been authorized by FDA under an Emergency Use Authorization (EUA). This test is only authorized for the duration of time the declaration that circumstances exist justifying the authorization of the emergency use of in vitro diagnostic tests for detection of SARS-CoV-2 virus and/or diagnosis of COVID-19 infection under section 564(b)(1) of the Act, 21 U.S.C. 102VOZ-3(G)(6), unless the authorization is terminated or revoked sooner. When diagnostic testing is negative, the possibility of a false negative result should be considered in the context of a patient's recent exposures and the presence of clinical signs and symptoms consistent with COVID-19. An individual without symptoms of COVID-19 and who is not shedding SARS-CoV-2 virus would expect to have a negative (not detected) result in this assay. Performed  At: [facility] [address] [place], [place] [id number] Rush Farmer MD ZD:[id number]    [place]  Final    Comment: Performed at [facility], [place] [address], [place], [place] [address]  Novel Coronavirus, NAA (hospital order; send-out to ref lab)     Status: None   Collection Time: 05/02/19  2:54 PM   Specimen: Nasopharyngeal Swab; Respiratory  Result Value Ref Range Status   SARS-CoV-2, NAA NOT DETECTED NOT DETECTED Final     Comment: (NOTE) This test was developed and its performance characteristics determined by [employer]. This test has not been FDA cleared or approved. This test has been authorized by FDA under an Emergency Use Authorization (EUA). This test is only authorized for the duration of time the declaration that circumstances exist justifying the authorization of the emergency use of in vitro diagnostic tests for detection of SARS-CoV-2 virus and/or diagnosis of COVID-19 infection under section 564(b)(1) of the Act, 21 U.S.C. 416SAY-3(K)(1), unless the authorization is terminated or revoked sooner. When diagnostic testing is negative, the possibility of a false negative  result should be considered in the context of a patient's recent exposures and the presence of clinical signs and symptoms consistent with COVID-19. An individual without symptoms of COVID-19 and who is not shedding SARS-CoV-2 virus would expect to have a negative (not detected) result in this assay. Performed  At: [facility] [address] [place], [place] [id number] Rush Farmer MD RZ:[id number]    [place]  Final    Comment: Performed at [facility], [place] [address] [place], [place] [address]      Radiology Studies: No results found.   Scheduled Meds: . aspirin EC  81 mg Oral Daily  . atenolol  25 mg Oral Daily  . cephALEXin  500 mg Oral Q12H  . enoxaparin (LOVENOX) injection  30 mg Subcutaneous Q24H  . feeding supplement (ENSURE ENLIVE)  237 mL Oral BID BM  . levothyroxine  75 mcg Oral QAC breakfast  . mirtazapine  7.5 mg Oral QHS  . multivitamin with minerals  1 tablet Oral Daily   Continuous Infusions:   LOS: 8 days   Time Spent in minutes   20 minutes  Anas Reister D.O. on 05/07/2019 at 10:27 AM  Between 7am to 7pm - Please see pager noted on [email or web address]  After 7pm go to [email or web address]  And look for the night coverage person covering for me after  hours  [facility] Office  [phone]

## 2019-05-07 NOTE — Progress Notes (Addendum)
[phone], sister Rowe Pavy, she would like an update from [facility] or doctor tomorrow on nursing home status.  [name] also wants to know if her A[place]unna can get Preser-vision prescribed.   Pt was able to talk to her sister on the phone.

## 2019-05-08 DIAGNOSIS — E43 Unspecified severe protein-calorie malnutrition: Secondary | ICD-10-CM | POA: Insufficient documentation

## 2019-05-08 LAB — COMPREHENSIVE METABOLIC PANEL
ALT: 27 U/L (ref 0–44)
AST: 32 U/L (ref 15–41)
Albumin: 2.7 g/dL — ABNORMAL LOW (ref 3.5–5.0)
Alkaline Phosphatase: 40 U/L (ref 38–126)
Anion gap: 9 (ref 5–15)
BUN: 44 mg/dL — ABNORMAL HIGH (ref 8–23)
CO2: 25 mmol/L (ref 22–32)
Calcium: 8 mg/dL — ABNORMAL LOW (ref 8.9–10.3)
Chloride: 103 mmol/L (ref 98–111)
Creatinine, Ser: 0.97 mg/dL (ref 0.44–1.00)
GFR calc Af Amer: 60 mL/min — ABNORMAL LOW (ref >60)
GFR calc non Af Amer: 51 mL/min — ABNORMAL LOW (ref >60)
Glucose, Bld: 96 mg/dL (ref 70–99)
Potassium: 4.4 mmol/L (ref 3.5–5.1)
Sodium: 137 mmol/L (ref 135–145)
Total Bilirubin: 0.3 mg/dL (ref 0.3–1.2)
Total Protein: 5.1 g/dL — ABNORMAL LOW (ref 6.5–8.1)

## 2019-05-08 NOTE — Care Management Important Message (Signed)
Important Message  Patient Details  Name: Patricia Campbell MRN: [id number] Date of Birth: 1926-11-17   Medicare Important Message Given:  Yes     [name] Montine Circle 05/08/2019, 4:42 PM

## 2019-05-08 NOTE — TOC Progression Note (Signed)
Transition of Care [facility]) - Progression Note    Patient Details  Name: Patricia Campbell MRN: [id number] Date of Birth: 02/05/1927  Transition of Care [facility]) CM/SW [place], LCSW Phone Number: 05/08/2019, 9:22 AM  Clinical Narrative:    CSW left voicemail for patient's sister to see if she can pay privately for SNF.    Expected Discharge Plan: Skilled Nursing Facility Barriers to Discharge: Insurance Authorization  Expected Discharge Plan and Services Expected Discharge Plan: [place] In-house Referral: Clinical Social Work   Post Acute Care Choice: [place] Living arrangements for the past 2 months: Single Family Home                           HH Arranged: NA           Social Determinants of Health (SDOH) Interventions    Readmission Risk Interventions No flowsheet data found.

## 2019-05-08 NOTE — TOC Progression Note (Signed)
Transition of Care [facility]) - Progression Note    Patient Details  Name: Patricia Campbell MRN: [id number] Date of Birth: 04/18/1927  Transition of Care [facility]) CM/SW [place], LCSW Phone Number: 05/08/2019, 4:39 PM  Clinical Narrative:    CSW attempted to reach patient's sister again with no success. Case discussed with CSW Supervisor who stated that we need to find out if patient will qualify for Medicaid. Financial counseling involved.    Expected Discharge Plan: Skilled Nursing Facility Barriers to Discharge: Insurance Authorization  Expected Discharge Plan and Services Expected Discharge Plan: [place] In-house Referral: Clinical Social Work   Post Acute Care Choice: [place] Living arrangements for the past 2 months: Single Family Home                           HH Arranged: NA           Social Determinants of Health (SDOH) Interventions    Readmission Risk Interventions No flowsheet data found.

## 2019-05-08 NOTE — Progress Notes (Signed)
PROGRESS NOTE    Patricia Campbell  BZJ:[id number] DOB: 1927/05/26 DOA: 04/28/2019 PCP: Rutherford Guys, MD   Brief Narrative:  HPI on 04/28/2019 by Dr. Shela Leff  Patricia Campbell is a 85 y.o. female with medical history significant of thyroid cancer status post thyroidectomy, degenerative disc disease, osteoporosis presenting to the hospital via EMS for evaluation of altered mental status and syncope.  Per his EMS report, patient lives alone at home and had a fall witnessed by family.  No loss of consciousness.  Upon arrival of the fire department, patient had a syncopal episode.  Hypotensive per EMS and a 500 cc bolus was given. Patient is confused and oriented to self only.  No history could be obtained from her.  No family available at this time. Assessment & Plan   E. coli urinary tract infection -Urine culture >100K EColi -Completed antibiotics (Keflex)  Acute metabolic encephalopathy superimposed on dementia -CT head with no acute findings, showed atrophy with chronic microvascular disease, likely has some underlying dementia.  No focal neurological deficits. -She still has waxing and waning mental status with sundowning.  Per sister, she is confused most of the time due to dementia.   -Continue low-dose Remeron.  Fall, syncope  -Patient presented with elevated BP, no seizure activity.  No chest pain or shortness of breath.  Troponins positive -PT evaluation recommended skilled nursing facility: "Patient seen for mobility progression. Pt is making progress toward PT goals and tolerated gait distance of 30 ft with min A and RW. Pt is oriented to self only. Continue to progress as tolerated with anticipated d/c to SNF for further skilled PT services."    -2D echo showed EF of > 01%, mild diastolic dysfunction  Elevated troponins -Likely due to hypertensive urgency, demand ischemia, no chest pain or shortness of breath on exam -2D echo showed preserved EF and mild diastolic  dysfunction  Hypertensive urgency -BP better controlled, continue atenolol.   Elevated LFTs -Mildly elevated AST -RUQ US showed normal gallbladder with no cholelithiasis, no intrahepatic biliary ductal dilatation  History of thyroidectomy, on thyroid hormone replacement therapy -Continue home Synthroid -Free T4 mildly elevated 1.3, TSH 16.0 -will need outpatient follow up with PCP and repeat labs   Severe malnutrition -in the context of chronic illness -Nutrition consulted -continue feeding supplementation  DVT Prophylaxis  Lovenox  Code Status: DNR  Family Communication: None at bedside.  Disposition Plan: Admitted.  COVID negative. Patient is 85years old with dementia and only oriented to self. She is not a safe discharge to home as she lives alone. [facility] has denied SNF placement. Discussed with [facility], will try to get CM and SW from [place] to discuss patient with our CM and SW to come up with a plan. Starting [facility] application. [employer] representative agreed that patient was not a safe discharge, however did not feel patient was skillable and really needed long term care placement.  Consultants None  Procedures  None  Antibiotics   Anti-infectives (From admission, onward)   Start     Dose/Rate Route Frequency Ordered Stop   05/01/19 1245  cephALEXin (KEFLEX) capsule 500 mg     500 mg Oral Every 12 hours 05/01/19 1236     05/01/19 1100  ceFAZolin (ANCEF) IVPB 1 g/50 mL premix  Status:  Discontinued     1 g 100 mL/hr over 30 Minutes Intravenous Every 12 hours 05/01/19 1053 05/01/19 1235   04/29/19 1830  cefTRIAXone (ROCEPHIN) 1 g in sodium chloride 0.9 %  100 mL IVPB  Status:  Discontinued     1 g 200 mL/hr over 30 Minutes Intravenous Every 24 hours 04/28/19 2105 05/01/19 1052   04/28/19 1730  cefTRIAXone (ROCEPHIN) 1 g in sodium chloride 0.9 % 100 mL IVPB     1 g 200 mL/hr over 30 Minutes Intravenous  Once 04/28/19 1726 04/28/19 1824      Subjective:   Patricia Campbell seen and examined today.  Patient with dementia. Currently eating breakfast. No complaints today.  Objective:   Vitals:   05/07/19 0442 05/07/19 1320 05/07/19 2149 05/08/19 0543  BP: (!) 151/68 (!) 117/52 (!) 127/54 139/60  Pulse: 66 64 78 69  Resp: 18 18 16 16   Temp: 97.8 F (36.6 C) (!) 97.5 F (36.4 C) 97.8 F (36.6 C) 97.8 F (36.6 C)  TempSrc:  Oral Oral Oral  SpO2: 97% 97% 96% 95%  Weight:      Height:        Intake/Output Summary (Last 24 hours) at 05/08/2019 1057 Last data filed at 05/08/2019 1610 Gross per 24 hour  Intake 620 ml  Output --  Net 620 ml   Filed Weights   04/28/19 1606  Weight: 40.8 kg   Exam  General: Well developed, elderly, NAD  HEENT: NCAT, mucous membranes moist.   Extremities: warm dry without cyanosis clubbing or edema  Neuro: AAOx1 (self), nonfocal  Psych: Pleasantly confused   Data Reviewed: I have personally reviewed following labs and imaging studies  CBC: No results for input(s): WBC, NEUTROABS, HGB, HCT, MCV, PLT in the last 168 hours. Basic Metabolic Panel: Recent Labs  Lab 05/02/19 0438 05/08/19 0323  NA 140 137  K 4.1 4.4  CL 108 103  CO2 25 25  GLUCOSE 93 96  BUN 26* 44*  CREATININE 0.72 0.97  CALCIUM 8.3* 8.0*   GFR: Estimated Creatinine Clearance: 24.8 mL/min (by C-G formula based on SCr of 0.97 mg/dL). Liver Function Tests: Recent Labs  Lab 05/08/19 0323  AST 32  ALT 27  ALKPHOS 40  BILITOT 0.3  PROT 5.1*  ALBUMIN 2.7*   No results for input(s): LIPASE, AMYLASE in the last 168 hours. No results for input(s): AMMONIA in the last 168 hours. Coagulation Profile: No results for input(s): INR, PROTIME in the last 168 hours. Cardiac Enzymes: No results for input(s): CKTOTAL, CKMB, CKMBINDEX, TROPONINI in the last 168 hours. BNP (last 3 results) No results for input(s): PROBNP in the last 8760 hours. HbA1C: No results for input(s): HGBA1C in the last 72 hours. CBG: No results for input(s):  GLUCAP in the last 168 hours. Lipid Profile: No results for input(s): CHOL, HDL, LDLCALC, TRIG, CHOLHDL, LDLDIRECT in the last 72 hours. Thyroid Function Tests: No results for input(s): TSH, T4TOTAL, FREET4, T3FREE, THYROIDAB in the last 72 hours. Anemia Panel: No results for input(s): VITAMINB12, FOLATE, FERRITIN, TIBC, IRON, RETICCTPCT in the last 72 hours. Urine analysis:    Component Value Date/Time   COLORURINE YELLOW 04/28/2019 1632   APPEARANCEUR CLOUDY (A) 04/28/2019 1632   LABSPEC 1.011 04/28/2019 1632   PHURINE 6.0 04/28/2019 1632   GLUCOSEU NEGATIVE 04/28/2019 1632   HGBUR SMALL (A) 04/28/2019 [place] 04/28/2019 1632   BILIRUBINUR negative 03/30/2017 1641   BILIRUBINUR NEG 07/22/2015 1403   KETONESUR 20 (A) 04/28/2019 1632   PROTEINUR 30 (A) 04/28/2019 1632   UROBILINOGEN 0.2 03/30/2017 1641   NITRITE NEGATIVE 04/28/2019 1632   LEUKOCYTESUR LARGE (A) 04/28/2019 1632   Sepsis Labs: @  LABRCNTIP(procalcitonin:4,lacticidven:4)  ) Recent Results (from the past 240 hour(s))  Urine culture     Status: Abnormal   Collection Time: 04/28/19  4:33 PM   Specimen: Urine, Random  Result Value Ref Range Status   Specimen Description URINE, RANDOM  Final   Special Requests   Final    NONE Performed at [facility], [address], [place], [place] [address]    Culture >=100,000 COLONIES/mL ESCHERICHIA COLI (A)  Final   Report Status 04/30/2019 FINAL  Final   Organism ID, Bacteria ESCHERICHIA COLI (A)  Final      Susceptibility   Escherichia coli - MIC*    AMPICILLIN >=32 RESISTANT Resistant     CEFAZOLIN <=4 SENSITIVE Sensitive     CEFTRIAXONE <=1 SENSITIVE Sensitive     CIPROFLOXACIN <=0.25 SENSITIVE Sensitive     GENTAMICIN >=16 RESISTANT Resistant     IMIPENEM 0.5 SENSITIVE Sensitive     NITROFURANTOIN <=16 SENSITIVE Sensitive     TRIMETH/SULFA >=320 RESISTANT Resistant     AMPICILLIN/SULBACTAM 8 SENSITIVE Sensitive     PIP/TAZO <=4  SENSITIVE Sensitive     Extended ESBL NEGATIVE Sensitive     * >=100,000 COLONIES/mL ESCHERICHIA COLI  Novel Coronavirus,NAA,(SEND-OUT TO REF LAB - TAT 24-48 hrs); Hosp Order     Status: None   Collection Time: 04/28/19  6:50 PM   Specimen: Nasopharyngeal Swab; Respiratory  Result Value Ref Range Status   SARS-CoV-2, NAA NOT DETECTED NOT DETECTED Final    Comment: (NOTE) This test was developed and its performance characteristics determined by [employer]. This test has not been FDA cleared or approved. This test has been authorized by FDA under an Emergency Use Authorization (EUA). This test is only authorized for the duration of time the declaration that circumstances exist justifying the authorization of the emergency use of in vitro diagnostic tests for detection of SARS-CoV-2 virus and/or diagnosis of COVID-19 infection under section 564(b)(1) of the Act, 21 U.S.C. 951OAC-1(Y)(6), unless the authorization is terminated or revoked sooner. When diagnostic testing is negative, the possibility of a false negative result should be considered in the context of a patient's recent exposures and the presence of clinical signs and symptoms consistent with COVID-19. An individual without symptoms of COVID-19 and who is not shedding SARS-CoV-2 virus would expect to have a negative (not detected) result in this assay. Performed  At: [facility] [address] [place], [place] [id number] Rush Farmer MD XN:[id number]    [place]  Final    Comment: Performed at [facility], [place] [address], [place], [place] [address]  Novel Coronavirus, NAA (hospital order; send-out to ref lab)     Status: None   Collection Time: 05/02/19  2:54 PM   Specimen: Nasopharyngeal Swab; Respiratory  Result Value Ref Range Status   SARS-CoV-2, NAA NOT DETECTED NOT DETECTED Final    Comment: (NOTE) This test was developed and its performance  characteristics determined by [employer]. This test has not been FDA cleared or approved. This test has been authorized by FDA under an Emergency Use Authorization (EUA). This test is only authorized for the duration of time the declaration that circumstances exist justifying the authorization of the emergency use of in vitro diagnostic tests for detection of SARS-CoV-2 virus and/or diagnosis of COVID-19 infection under section 564(b)(1) of the Act, 21 U.S.C. 623JSE-8(B)(1), unless the authorization is terminated or revoked sooner. When diagnostic testing is negative, the possibility of a false negative result should  be considered in the context of a patient's recent exposures and the presence of clinical signs and symptoms consistent with COVID-19. An individual without symptoms of COVID-19 and who is not shedding SARS-CoV-2 virus would expect to have a negative (not detected) result in this assay. Performed  At: [facility] [address] [place], [place] [id number] Rush Farmer MD JQ:[id number]    [place]  Final    Comment: Performed at [facility], [place] [address] [place], [place] [address]      Radiology Studies: No results found.   Scheduled Meds:  aspirin EC  81 mg Oral Daily   atenolol  25 mg Oral Daily   cephALEXin  500 mg Oral Q12H   enoxaparin (LOVENOX) injection  30 mg Subcutaneous Q24H   feeding supplement (ENSURE ENLIVE)  237 mL Oral BID BM   levothyroxine  75 mcg Oral QAC breakfast   mirtazapine  7.5 mg Oral QHS   multivitamin with minerals  1 tablet Oral Daily   Continuous Infusions:   LOS: 9 days   Time Spent in minutes   20 minutes  Lodie Waheed D.O. on 05/08/2019 at 10:57 AM  Between 7am to 7pm - Please see pager noted on [email or web address]  After 7pm go to [email or web address]  And look for the night coverage person covering for me after hours  [facility] Office  [phone]

## 2019-05-08 NOTE — Progress Notes (Signed)
Physical Therapy Treatment Patient Details Name: Patricia Campbell MRN: [id number] DOB: [birth year] Today's Date: 05/08/2019    History of Present Illness Patient is a 85 year old women. She rpesented to the hospital after a witnessed fall by her family. Per note she has been having an altered mental status.  PMH: Thyroid disease, scoliosis, DDD of the lumbar and thhoriacic spine;    PT Comments    Patient seen for mobility progression. Pt is making progress toward PT goals and tolerated increased gait distance. Pt requires min A for OOB mobility due to impaired balance and generalized weakness. Given mobility level and cognitive deficits current plan remains appropriate.     Follow Up Recommendations  SNF     Equipment Recommendations  Other (comment)(TBD next venue)    Recommendations for Other Services       Precautions / Restrictions Precautions Precautions: Fall Restrictions Weight Bearing Restrictions: No    Mobility  Bed Mobility Overal bed mobility: Needs Assistance Bed Mobility: Supine to Sit     Supine to sit: Supervision     General bed mobility comments: supervision for safety  Transfers Overall transfer level: Needs assistance Equipment used: Rolling walker (2 wheeled) Transfers: Sit to/from Stand Sit to Stand: Min assist         General transfer comment: assist to steady; cues for safe hand placement  Ambulation/Gait Ambulation/Gait assistance: Min assist Gait Distance (Feet): 80 Feet Assistive device: Rolling walker (2 wheeled) Gait Pattern/deviations: Step-through pattern;Trunk flexed;Decreased stride length;Narrow base of support Gait velocity: decreased   General Gait Details: cues for upright posture and maintaining safe proximity to RW especially when turning; unsteady gait but no overt LOB during session   Stairs             Wheelchair Mobility    Modified Rankin (Stroke Patients Only)       Balance Overall balance  assessment: Needs assistance Sitting-balance support: Feet supported;Single extremity supported Sitting balance-Leahy Scale: Fair     Standing balance support: Bilateral upper extremity supported Standing balance-Leahy Scale: Poor                              Cognition Arousal/Alertness: Awake/alert Behavior During Therapy: WFL for tasks assessed/performed Overall Cognitive Status: No family/caregiver present to determine baseline cognitive functioning Area of Impairment: Orientation;Memory;Safety/judgement;Problem solving                 Orientation Level: Disoriented to;Place;Time;Situation   Memory: Decreased short-term memory   Safety/Judgement: Decreased awareness of safety;Decreased awareness of deficits   Problem Solving: Difficulty sequencing;Requires verbal cues        Exercises      General Comments General comments (skin integrity, edema, etc.): session limited due to patient requesting to finish eating lunch      Pertinent Vitals/Pain Pain Assessment: No/denies pain    Home Living                      Prior Function            PT Goals (current goals can now be found in the care plan section) Progress towards PT goals: Progressing toward goals    Frequency    Min 3X/week      PT Plan Current plan remains appropriate    Co-evaluation              AM-PAC PT "6 Clicks" Mobility   Outcome Measure  Help needed turning from your back to your side while in a flat bed without using bedrails?: None Help needed moving from lying on your back to sitting on the side of a flat bed without using bedrails?: None Help needed moving to and from a bed to a chair (including a wheelchair)?: A Little Help needed standing up from a chair using your arms (e.g., wheelchair or bedside chair)?: A Little Help needed to walk in hospital room?: A Little Help needed climbing 3-5 steps with a railing? : A Lot 6 Click Score: 19    End  of Session Equipment Utilized During Treatment: Gait belt Activity Tolerance: Patient tolerated treatment well Patient left: in chair;with call bell/phone within reach;with chair alarm set Nurse Communication: Mobility status PT Visit Diagnosis: Unsteadiness on feet (R26.81);Other abnormalities of gait and mobility (R26.89);Muscle weakness (generalized) (M62.81)     Time: 1552-0802 PT Time Calculation (min) (ACUTE ONLY): 12 min  Charges:  $Gait Training: 8-22 mins                     Earney Navy, PTA Acute Rehabilitation Services Pager: [phone] Office: [phone]     Darliss Cheney 05/08/2019, 1:56 PM

## 2019-05-09 NOTE — Care Management Important Message (Signed)
Important Message  Patient Details  Name: Patricia Campbell MRN: [id number] Date of Birth: [birth year]   Medicare Important Message Given:  Yes     Memory Argue 05/09/2019, 5:24 PM

## 2019-05-09 NOTE — Progress Notes (Addendum)
NCM called financial counselor [place], [phone]) for update on pt status with Medicaid application. F.C., Juanita,stated she has had problems with contacting pt's sister for screening, however will f/u this morning. Whitman Hero RN,BSN,CM

## 2019-05-09 NOTE — Clinical Social Work Note (Addendum)
Insurance denied patient, however the discharge plan remains Patricia Campbell - private pay. CSW advised by nurse case manager, Patricia Campbell that patient may be able to pay privately and case manager has made contact with the sister regarding applying for Medicaid. A call was also made to financial counselor by Patricia Campbell regarding Medicaid application for patient and they are trying to reach patient's sister regarding the [place] application. Was advised by admissions liaison, Patricia Campbell that a recent COVID is needed. MD contacted and request made. Social work will continue to follow and facilitate discharge to [place] once [place] results are in.  Per Patricia Campbell with [facility], the cost per day for SNF rehab is $279.50 and a monthly total of $8,385.00 (room and board). They will submit to Medicare for meds and therapies.   Patricia Campbell, MSW, LCSW Licensed Clinical Social Worker [place] [phone]

## 2019-05-09 NOTE — Progress Notes (Signed)
PROGRESS NOTE    Patricia Campbell  JQB:[id number] DOB: [birth year] DOA: 04/28/2019 PCP: Rutherford Guys, MD   Brief Narrative:  HPI on 04/28/2019 by Dr. Shela Leff  Patricia Campbell is a 85 y.o. female with medical history significant of thyroid cancer status post thyroidectomy, degenerative disc disease, osteoporosis presenting to the hospital via EMS for evaluation of altered mental status and syncope.  Per his EMS report, patient lives alone at home and had a fall witnessed by family.  No loss of consciousness.  Upon arrival of the fire department, patient had a syncopal episode.  Hypotensive per EMS and a 500 cc bolus was given. Patient is confused and oriented to self only.  No history could be obtained from her.  No family available at this time.  Interim history Admitted for Ecoli UTI and AMS. Completed antibiotics. Cannot discharge patient to SNF as insurance denied. Cannot discharge patient to home alone.  Assessment & Plan   E. coli urinary tract infection -Urine culture >100K EColi -Completed antibiotics (Keflex)  Acute metabolic encephalopathy superimposed on dementia -CT head with no acute findings, showed atrophy with chronic microvascular disease, likely has some underlying dementia.  No focal neurological deficits. -She still has waxing and waning mental status with sundowning.  Per sister, she is confused most of the time due to dementia.   -Continue low-dose Remeron.  Fall, syncope  -Patient presented with elevated BP, no seizure activity.  No chest pain or shortness of breath.  Troponins positive -PT evaluation recommended skilled nursing facility: "Patient seen for mobility progression. Pt is making progress toward PT goals and tolerated gait distance of 30 ft with min A and RW. Pt is oriented to self only. Continue to progress as tolerated with anticipated d/c to SNF for further skilled PT services."    -2D echo showed EF of > 40%, mild diastolic dysfunction   Elevated troponins -Likely due to hypertensive urgency, demand ischemia, no chest pain or shortness of breath on exam -2D echo showed preserved EF and mild diastolic dysfunction  Hypertensive urgency -BP better controlled, continue atenolol.   Elevated LFTs -Mildly elevated AST -RUQ US showed normal gallbladder with no cholelithiasis, no intrahepatic biliary ductal dilatation  History of thyroidectomy, on thyroid hormone replacement therapy -Continue home Synthroid -Free T4 mildly elevated 1.3, TSH 16.0 -will need outpatient follow up with PCP and repeat labs   Severe malnutrition -in the context of chronic illness -Nutrition consulted -continue feeding supplementation  DVT Prophylaxis  Lovenox  Code Status: DNR  Family Communication: None at bedside.  Disposition Plan: Admitted.  COVID negative. Patient is 85years old with dementia and only oriented to self. She is not a safe discharge to home as she lives alone. [facility] has denied SNF placement. Discussed with [facility], will try to get CM and SW from [place] to discuss patient with our CM and SW to come up with a plan. Starting [facility] application. [employer] representative agreed that patient was not a safe discharge, however did not feel patient was skillable and really needed long term care placement.  Consultants None  Procedures  None  Antibiotics   Anti-infectives (From admission, onward)   Start     Dose/Rate Route Frequency Ordered Stop   05/01/19 1245  cephALEXin (KEFLEX) capsule 500 mg     500 mg Oral Every 12 hours 05/01/19 1236     05/01/19 1100  ceFAZolin (ANCEF) IVPB 1 g/50 mL premix  Status:  Discontinued     1 g  100 mL/hr over 30 Minutes Intravenous Every 12 hours 05/01/19 1053 05/01/19 1235   04/29/19 1830  cefTRIAXone (ROCEPHIN) 1 g in sodium chloride 0.9 % 100 mL IVPB  Status:  Discontinued     1 g 200 mL/hr over 30 Minutes Intravenous Every 24 hours 04/28/19 2105 05/01/19 1052   04/28/19 1730  cefTRIAXone  (ROCEPHIN) 1 g in sodium chloride 0.9 % 100 mL IVPB     1 g 200 mL/hr over 30 Minutes Intravenous  Once 04/28/19 1726 04/28/19 1824      Subjective:   Patricia Campbell seen and examined today.  Patient with dementia. No complaints today.  Objective:   Vitals:   05/08/19 0543 05/08/19 1443 05/08/19 2221 05/09/19 0652  BP: 139/60 (!) 111/49 (!) 120/50 (!) 130/53  Pulse: 69 71 78 79  Resp: 16 20 11 18   Temp: 97.8 F (36.6 C) 98 F (36.7 C) 98 F (36.7 C) 97.6 F (36.4 C)  TempSrc: Oral Oral    SpO2: 95% 96% 94% 94%  Weight:      Height:       No intake or output data in the 24 hours ending 05/09/19 0951 Filed Weights   04/28/19 1606  Weight: 40.8 kg   Exam  General: Well developed, thin, NAD  HEENT: NCAT, mucous membranes moist.   Cardiovascular: S1 S2 auscultated, RRR  Respiratory: Clear to auscultation bilaterally  Abdomen: Soft, nontender, nondistended, + bowel sounds  Extremities: warm dry without cyanosis clubbing or edema  Neuro: AAOx1 (self) nonfocal  Psych: Pleasantly confused  Data Reviewed: I have personally reviewed following labs and imaging studies  CBC: No results for input(s): WBC, NEUTROABS, HGB, HCT, MCV, PLT in the last 168 hours. Basic Metabolic Panel: Recent Labs  Lab 05/08/19 0323  NA 137  K 4.4  CL 103  CO2 25  GLUCOSE 96  BUN 44*  CREATININE 0.97  CALCIUM 8.0*   GFR: Estimated Creatinine Clearance: 24.8 mL/min (by C-G formula based on SCr of 0.97 mg/dL). Liver Function Tests: Recent Labs  Lab 05/08/19 0323  AST 32  ALT 27  ALKPHOS 40  BILITOT 0.3  PROT 5.1*  ALBUMIN 2.7*   No results for input(s): LIPASE, AMYLASE in the last 168 hours. No results for input(s): AMMONIA in the last 168 hours. Coagulation Profile: No results for input(s): INR, PROTIME in the last 168 hours. Cardiac Enzymes: No results for input(s): CKTOTAL, CKMB, CKMBINDEX, TROPONINI in the last 168 hours. BNP (last 3 results) No results for input(s):  PROBNP in the last 8760 hours. HbA1C: No results for input(s): HGBA1C in the last 72 hours. CBG: No results for input(s): GLUCAP in the last 168 hours. Lipid Profile: No results for input(s): CHOL, HDL, LDLCALC, TRIG, CHOLHDL, LDLDIRECT in the last 72 hours. Thyroid Function Tests: No results for input(s): TSH, T4TOTAL, FREET4, T3FREE, THYROIDAB in the last 72 hours. Anemia Panel: No results for input(s): VITAMINB12, FOLATE, FERRITIN, TIBC, IRON, RETICCTPCT in the last 72 hours. Urine analysis:    Component Value Date/Time   COLORURINE YELLOW 04/28/2019 1632   APPEARANCEUR CLOUDY (A) 04/28/2019 1632   LABSPEC 1.011 04/28/2019 1632   PHURINE 6.0 04/28/2019 1632   GLUCOSEU NEGATIVE 04/28/2019 1632   HGBUR SMALL (A) 04/28/2019 [place] 04/28/2019 1632   BILIRUBINUR negative 03/30/2017 1641   BILIRUBINUR NEG 07/22/2015 1403   KETONESUR 20 (A) 04/28/2019 1632   PROTEINUR 30 (A) 04/28/2019 1632   UROBILINOGEN 0.2 03/30/2017 1641   NITRITE NEGATIVE 04/28/2019  [place] (A) 04/28/2019 1632   Sepsis Labs: @LABRCNTIP (procalcitonin:4,lacticidven:4)  ) Recent Results (from the past 240 hour(s))  Novel Coronavirus, NAA (hospital order; send-out to ref lab)     Status: None   Collection Time: 05/02/19  2:54 PM   Specimen: Nasopharyngeal Swab; Respiratory  Result Value Ref Range Status   SARS-CoV-2, NAA NOT DETECTED NOT DETECTED Final    Comment: (NOTE) This test was developed and its performance characteristics determined by [employer]. This test has not been FDA cleared or approved. This test has been authorized by FDA under an Emergency Use Authorization (EUA). This test is only authorized for the duration of time the declaration that circumstances exist justifying the authorization of the emergency use of in vitro diagnostic tests for detection of SARS-CoV-2 virus and/or diagnosis of COVID-19 infection under section 564(b)(1) of the  Act, 21 U.S.C. 903YBF-3(O)(3), unless the authorization is terminated or revoked sooner. When diagnostic testing is negative, the possibility of a false negative result should be considered in the context of a patient's recent exposures and the presence of clinical signs and symptoms consistent with COVID-19. An individual without symptoms of COVID-19 and who is not shedding SARS-CoV-2 virus would expect to have a negative (not detected) result in this assay. Performed  At: [facility] [address] [place], [place] [id number] Rush Farmer MD YO:[id number]    [place]  Final    Comment: Performed at [facility], [place] [address], [place], [place] [address]      Radiology Studies: No results found.   Scheduled Meds: . aspirin EC  81 mg Oral Daily  . atenolol  25 mg Oral Daily  . cephALEXin  500 mg Oral Q12H  . enoxaparin (LOVENOX) injection  30 mg Subcutaneous Q24H  . feeding supplement (ENSURE ENLIVE)  237 mL Oral BID BM  . levothyroxine  75 mcg Oral QAC breakfast  . mirtazapine  7.5 mg Oral QHS  . multivitamin with minerals  1 tablet Oral Daily   Continuous Infusions:   LOS: 10 days   Time Spent in minutes   20 minutes  Patricia Campbell D.O. on 05/09/2019 at 9:51 AM  Between 7am to 7pm - Please see pager noted on [email or web address]  After 7pm go to [email or web address]  And look for the night coverage person covering for me after hours  [facility] Office  [phone]

## 2019-05-10 LAB — NOVEL CORONAVIRUS, NAA (HOSP ORDER, SEND-OUT TO REF LAB; TAT 18-24 HRS): SARS-CoV-2, NAA: NOT DETECTED

## 2019-05-10 NOTE — Progress Notes (Signed)
PROGRESS NOTE  Patricia Campbell LKG:[id number] DOB: 1927/08/10 DOA: 04/28/2019 PCP: Rutherford Guys, MD  HPI/Recap of past 24 hours: HPI on 04/28/2019 by Dr. [name] a 85 y.o.femalewith medical history significant ofthyroid cancer status post thyroidectomy, degenerative disc disease, osteoporosis presenting to the hospital via EMS for evaluation of altered mental status and syncope. Per his EMS report, patient lives alone at home and had a fall witnessed by family. No loss of consciousness. Upon arrival of the fire department, patient had a syncopal episode. Hypotensive per EMS and a 500 cc bolus was given. Patient is confused and oriented to self only. No history could be obtained from her.No family available at this time.  Interim history Admitted for Ecoli UTI and AMS. Completed antibiotics. Cannot discharge patient to SNF as insurance denied. Cannot discharge patient to home alone.   05/10/19: Patient seen an examined at her bedside.  She has no new complaints.  Disposition barrier for discharge at this point.  Insurance denied SNF.  And patient lives alone.  CSW is working on [employer] application.  Assessment/Plan: Principal Problem:   UTI (urinary tract infection) Active Problems:   Pressure injury of skin   Acute encephalopathy   Syncope   Hypertensive urgency   Protein-calorie malnutrition, severe  E. coli urinary tract infection -Urine culture >100K EColi -Completed antibiotics (Keflex)  Acute metabolic encephalopathy superimposed on dementia -CT head with no acute findings, showed atrophy with chronic microvascular disease, likely has some underlying dementia. No focal neurological deficits. -She still has waxing and waning mental status with sundowning. Per sister, she is confused most of the time due to dementia.  -Continue low-dose Remeron.  Fall, syncope  -Patient presented with elevated BP, no seizure activity. No chest pain or  shortness of breath. Troponins positive -PT evaluation recommended skilled nursing facility: "Patient seen for mobility progression. Pt is making progress toward PT goals and tolerated gait distance of 30 ft with min A and RW. Pt is oriented to self only.Continue to progress as tolerated with anticipated d/c to SNF for further skilled PT services." -2Decho showed EF GU>44%, mild diastolic dysfunction  Elevated troponins -Likely due to hypertensive urgency, demand ischemia, no chest pain or shortness of breath on exam -2D echoshowed preserved EF and mild diastolic dysfunction  Hypertensive urgency -BP better controlled, continue atenolol.  Elevated LFTs -Mildly elevated AST -RUQ US showed normal gallbladder with no cholelithiasis, no intrahepatic biliary ductal dilatation  History of thyroidectomy, on thyroid hormone replacement therapy -Continue home Synthroid -Free T4 mildly elevated 1.3, TSH 16.0 -will need outpatient follow up with PCP and repeat labs   Severe malnutrition -in the context of chronic illness -Nutrition consulted -continue feeding supplementation  DVT Prophylaxis  Lovenox  Code Status: DNR  Family Communication: None at bedside.  Disposition Plan: Admitted.  COVID negative. Patient is 85years old with dementia and only oriented to self. She is not a safe discharge to home as she lives alone. [facility] has denied SNF placement. Discussed with [facility], will try to get CM and SW from [place] to discuss patient with our CM and SW to come up with a plan. Starting [facility] application. [employer] representative agreed that patient was not a safe discharge, however did not feel patient was skillable and really needed long term care placement.  Consultants None  Procedures  None   Objective: Vitals:   05/08/19 2221 05/09/19 0652 05/09/19 2205 05/10/19 0543  BP: (!) 120/50 (!) 130/53 (!) 125/58 130/62  Pulse: 78 79 86 72  Resp: 11 18 15 16   Temp: 98 F  (36.7 C) 97.6 F (36.4 C) 98.2 F (36.8 C) 98.6 F (37 C)  TempSrc:   Axillary Axillary  SpO2: 94% 94% 93%   Weight:    41 kg  Height:        Intake/Output Summary (Last 24 hours) at 05/10/2019 0941 Last data filed at 05/09/2019 1358 Gross per 24 hour  Intake 720 ml  Output -  Net 720 ml   Filed Weights   04/28/19 1606 05/10/19 0543  Weight: 40.8 kg 41 kg    Exam:  . General: 85 y.o. year-old female well developed well nourished in no acute distress.  Alert and interactive.  Pleasantly confused. . Cardiovascular: Regular rate and rhythm with no rubs or gallops.  No thyromegaly or JVD noted.   Marland Kitchen Respiratory: Clear to auscultation with no wheezes or rales. Good inspiratory effort. . Abdomen: Soft nontender nondistended with normal bowel sounds x4 quadrants. . Musculoskeletal: No lower extremity edema. 2/4 pulses in all 4 extremities. Marland Kitchen Psychiatry: Mood is appropriate for condition and setting   Data Reviewed: CBC: No results for input(s): WBC, NEUTROABS, HGB, HCT, MCV, PLT in the last 168 hours. Basic Metabolic Panel: Recent Labs  Lab 05/08/19 0323  NA 137  K 4.4  CL 103  CO2 25  GLUCOSE 96  BUN 44*  CREATININE 0.97  CALCIUM 8.0*   GFR: Estimated Creatinine Clearance: 24.9 mL/min (by C-G formula based on SCr of 0.97 mg/dL). Liver Function Tests: Recent Labs  Lab 05/08/19 0323  AST 32  ALT 27  ALKPHOS 40  BILITOT 0.3  PROT 5.1*  ALBUMIN 2.7*   No results for input(s): LIPASE, AMYLASE in the last 168 hours. No results for input(s): AMMONIA in the last 168 hours. Coagulation Profile: No results for input(s): INR, PROTIME in the last 168 hours. Cardiac Enzymes: No results for input(s): CKTOTAL, CKMB, CKMBINDEX, TROPONINI in the last 168 hours. BNP (last 3 results) No results for input(s): PROBNP in the last 8760 hours. HbA1C: No results for input(s): HGBA1C in the last 72 hours. CBG: No results for input(s): GLUCAP in the last 168 hours. Lipid Profile:  No results for input(s): CHOL, HDL, LDLCALC, TRIG, CHOLHDL, LDLDIRECT in the last 72 hours. Thyroid Function Tests: No results for input(s): TSH, T4TOTAL, FREET4, T3FREE, THYROIDAB in the last 72 hours. Anemia Panel: No results for input(s): VITAMINB12, FOLATE, FERRITIN, TIBC, IRON, RETICCTPCT in the last 72 hours. Urine analysis:    Component Value Date/Time   COLORURINE YELLOW 04/28/2019 1632   APPEARANCEUR CLOUDY (A) 04/28/2019 1632   LABSPEC 1.011 04/28/2019 1632   PHURINE 6.0 04/28/2019 1632   GLUCOSEU NEGATIVE 04/28/2019 1632   HGBUR SMALL (A) 04/28/2019 1632   BILIRUBINUR NEGATIVE 04/28/2019 1632   BILIRUBINUR negative 03/30/2017 1641   BILIRUBINUR NEG 07/22/2015 1403   KETONESUR 20 (A) 04/28/2019 1632   PROTEINUR 30 (A) 04/28/2019 1632   UROBILINOGEN 0.2 03/30/2017 1641   NITRITE NEGATIVE 04/28/2019 1632   LEUKOCYTESUR LARGE (A) 04/28/2019 1632   Sepsis Labs: @LABRCNTIP (procalcitonin:4,lacticidven:4)  ) Recent Results (from the past 240 hour(s))  Novel Coronavirus, NAA (hospital order; send-out to ref lab)     Status: None   Collection Time: 05/02/19  2:54 PM   Specimen: Nasopharyngeal Swab; Respiratory  Result Value Ref Range Status   SARS-CoV-2, NAA NOT DETECTED NOT DETECTED Final    Comment: (NOTE) This test was developed and its performance characteristics determined by  [employer]. This test has not been FDA cleared or approved. This test has been authorized by FDA under an Emergency Use Authorization (EUA). This test is only authorized for the duration of time the declaration that circumstances exist justifying the authorization of the emergency use of in vitro diagnostic tests for detection of SARS-CoV-2 virus and/or diagnosis of COVID-19 infection under section 564(b)(1) of the Act, 21 U.S.C. 696EXB-2(W)(4), unless the authorization is terminated or revoked sooner. When diagnostic testing is negative, the possibility of a false negative result  should be considered in the context of a patient's recent exposures and the presence of clinical signs and symptoms consistent with COVID-19. An individual without symptoms of COVID-19 and who is not shedding SARS-CoV-2 virus would expect to have a negative (not detected) result in this assay. Performed  At: [facility] [address] [place], [place] [id number] Rush Farmer MD VO:[id number]    [place]  Final    Comment: Performed at [facility], [place] [address] [place], [place] [address]      Studies: No results found.  Scheduled Meds: . aspirin EC  81 mg Oral Daily  . atenolol  25 mg Oral Daily  . cephALEXin  500 mg Oral Q12H  . enoxaparin (LOVENOX) injection  30 mg Subcutaneous Q24H  . feeding supplement (ENSURE ENLIVE)  237 mL Oral BID BM  . levothyroxine  75 mcg Oral QAC breakfast  . mirtazapine  7.5 mg Oral QHS  . multivitamin with minerals  1 tablet Oral Daily    Continuous Infusions:   LOS: 11 days     Kayleen Memos, MD [facility] Pager [phone]  If 7PM-7AM, please contact night-coverage [email or web address] Password [facility] 05/10/2019, 9:41 AM

## 2019-05-10 NOTE — Progress Notes (Signed)
Nutrition Follow-up  DOCUMENTATION CODES:   Severe malnutrition in context of chronic illness, Underweight  INTERVENTION:   -Continue Ensure Enlive po BID, each supplement provides 350 kcal and 20 grams of protein -Continue MVI with minerals daily  NUTRITION DIAGNOSIS:   Severe Malnutrition related to chronic illness(thyroid cancer, dementia) as evidenced by moderate fat depletion, severe fat depletion, moderate muscle depletion, severe muscle depletion.  Ongoing  GOAL:   Patient will meet greater than or equal to 90% of their needs  Progressing   MONITOR:   PO intake, Supplement acceptance, Labs, Weight trends, Skin, I & O's  REASON FOR ASSESSMENT:   Other (Comment)    ASSESSMENT:   Patricia Campbell is a 85 y.o. female with medical history significant of thyroid cancer status post thyroidectomy, degenerative disc disease, osteoporosis presenting to the hospital via EMS for evaluation of altered mental status and syncope.  Per his EMS report, patient lives alone at home and had a fall witnessed by family.  No loss of consciousness.  Upon arrival of the fire department, patient had a syncopal episode.  Hypotensive per EMS and a 500 cc bolus was given. Patient is confused and oriented to self only.  No history could be obtained from her.  No family available at this time.  Reviewed I/O's: +720 ml x 24 hours and +4.7 L since admission  Pt resting quietly in bed at time of visit. RD did not disturb.   Observed breakfast tray- pt consumed about 50% of meal tray. Noted meal completion documented at 50%. Pt has been consuming Ensure supplements. Noted completed container of Ensure on tray table.   Pt medically stable for discharge. Awaiting insurance authorization to d/c to SNF.   Labs reviewed.   Diet Order:   Diet Order            Diet Heart Room service appropriate? No; Fluid consistency: Thin  Diet effective now              EDUCATION NEEDS:   Education needs have  been addressed  Skin:  Skin Assessment: Skin Integrity Issues: Skin Integrity Issues:: Stage I Stage I: sacrum  Last BM:  05/05/19  Height:   Ht Readings from Last 1 Encounters:  04/28/19 5\' 1"  (1.549 m)    Weight:   Wt Readings from Last 1 Encounters:  05/10/19 41 kg    Ideal Body Weight:  47.7 kg  BMI:  Body mass index is 17.08 kg/m.  Estimated Nutritional Needs:   Kcal:  1200-1400  Protein:  45-60 grams  Fluid:  > 1.2 L    Ailana Cuadrado A. Jimmye Norman, RD, LDN, [place] Registered Dietitian II Certified Diabetes Care and Education Specialist Pager: [phone] After hours Pager: [phone]

## 2019-05-11 DIAGNOSIS — Z23 Encounter for immunization: Secondary | ICD-10-CM | POA: Diagnosis not present

## 2019-05-11 MED ORDER — ASPIRIN 81 MG PO TBEC: 81.0000 mg | DELAYED_RELEASE_TABLET | Freq: Every day | ORAL | 0 refills | Status: AC

## 2019-05-11 MED ORDER — ATENOLOL 25 MG PO TABS: 25.0000 mg | ORAL_TABLET | Freq: Every day | ORAL | 0 refills | Status: AC

## 2019-05-11 MED ORDER — ENSURE ENLIVE PO LIQD: 14.0000 | Freq: Two times a day (BID) | ORAL | 0 refills | Status: AC

## 2019-05-11 MED ORDER — MIRTAZAPINE 7.5 MG PO TABS: 7.5000 mg | ORAL_TABLET | Freq: Every day | ORAL | 0 refills | Status: AC

## 2019-05-11 NOTE — Progress Notes (Signed)
Attempted to call report 2 times but no answer from nurse. I gave my number to the operator to have the nurse call me when they are ready for report. Instructed her that transport is arranged for 11:30.

## 2019-05-11 NOTE — Discharge Instructions (Signed)
Urinary Tract Infection, Adult A urinary tract infection (UTI) is an infection of any part of the urinary tract. The urinary tract includes:  The kidneys.  The ureters.  The bladder.  The urethra. These organs make, store, and get rid of pee (urine) in the body. What are the causes? This is caused by germs (bacteria) in your genital area. These germs grow and cause swelling (inflammation) of your urinary tract. What increases the risk? You are more likely to develop this condition if:  You have a small, thin tube (catheter) to drain pee.  You cannot control when you pee or poop (incontinence).  You are female, and: ? You use these methods to prevent pregnancy: ? A medicine that kills sperm (spermicide). ? A device that blocks sperm (diaphragm). ? You have low levels of a female hormone (estrogen). ? You are pregnant.  You have genes that add to your risk.  You are sexually active.  You take antibiotic medicines.  You have trouble peeing because of: ? A prostate that is bigger than normal, if you are female. ? A blockage in the part of your body that drains pee from the bladder (urethra). ? A kidney stone. ? A nerve condition that affects your bladder (neurogenic bladder). ? Not getting enough to drink. ? Not peeing often enough.  You have other conditions, such as: ? Diabetes. ? A weak disease-fighting system (immune system). ? Sickle cell disease. ? Gout. ? Injury of the spine. What are the signs or symptoms? Symptoms of this condition include:  Needing to pee right away (urgently).  Peeing often.  Peeing small amounts often.  Pain or burning when peeing.  Blood in the pee.  Pee that smells bad or not like normal.  Trouble peeing.  Pee that is cloudy.  Fluid coming from the vagina, if you are female.  Pain in the belly or lower back. Other symptoms include:  Throwing up (vomiting).  No urge to eat.  Feeling mixed up (confused).  Being tired  and grouchy (irritable).  A fever.  Watery poop (diarrhea). How is this treated? This condition may be treated with:  Antibiotic medicine.  Other medicines.  Drinking enough water. Follow these instructions at home:  Medicines  Take over-the-counter and prescription medicines only as told by your doctor.  If you were prescribed an antibiotic medicine, take it as told by your doctor. Do not stop taking it even if you start to feel better. General instructions  Make sure you: ? Pee until your bladder is empty. ? Do not hold pee for a long time. ? Empty your bladder after sex. ? Wipe from front to back after pooping if you are a female. Use each tissue one time when you wipe.  Drink enough fluid to keep your pee pale yellow.  Keep all follow-up visits as told by your doctor. This is important. Contact a doctor if:  You do not get better after 1-2 days.  Your symptoms go away and then come back. Get help right away if:  You have very bad back pain.  You have very bad pain in your lower belly.  You have a fever.  You are sick to your stomach (nauseous).  You are throwing up. Summary  A urinary tract infection (UTI) is an infection of any part of the urinary tract.  This condition is caused by germs in your genital area.  There are many risk factors for a UTI. These include having a small, thin   tube to drain pee and not being able to control when you pee or poop.  Treatment includes antibiotic medicines for germs.  Drink enough fluid to keep your pee pale yellow. This information is not intended to replace advice given to you by your health care provider. Make sure you discuss any questions you have with your health care provider. Document Released: 04/13/2008 Document Revised: 10/13/2018 Document Reviewed: 05/05/2018 Elsevier Patient Education  2020 [employer]  

## 2019-05-11 NOTE — TOC Transition Note (Signed)
Transition of Care [facility]) - CM/SW Discharge Note   Patient Details  Name: Patricia Campbell MRN: [id number] Date of Birth: February 08, 1927  [facility] CM/SW Contact:  Benard Halsted, LCSW Phone Number: 05/11/2019, 10:56 AM   Clinical Narrative:    Patient will DC to: [place] Anticipated DC date: 05/11/19 Family notified: Sister, [name] Transport by: Corey Harold 11:30am   Per MD patient ready for DC to [facility]. RN, patient, patient's family, and facility notified of DC. Discharge Summary and FL2 sent to facility. RN to call report prior to discharge ([phone]). DC packet on chart. Ambulance transport requested for patient.   CSW will sign off for now as social work intervention is no longer needed. Please consult [place] again if new needs arise.  Cedric Fishman, LCSW Clinical Social Worker [phone]    Final next level of care: Skilled Nursing Facility Barriers to Discharge: No Barriers Identified   Patient Goals and CMS Choice Patient states their goals for this hospitalization and ongoing recovery are:: rehab CMS Medicare.gov Compare Post Acute Care list provided to:: Patient Represenative (must comment)(Sister) Choice offered to / list presented to : Sibling  Discharge Placement   Existing PASRR number confirmed : 05/11/19          Patient chooses bed at: [facility] Patient to be transferred to facility by: [place] Name of family member notified: Sister, [name] Patient and family notified of of transfer: 05/11/19  Discharge Plan and Services In-house Referral: Clinical Social Work   Post Acute Care Choice: [place]                    HH Arranged: NA          Social Determinants of Health (SDOH) Interventions     Readmission Risk Interventions No flowsheet data found.

## 2019-05-11 NOTE — Progress Notes (Signed)
Patient discharged via [facility] to SNF.

## 2019-05-11 NOTE — TOC Progression Note (Signed)
Transition of Care [facility]) - Progression Note    Patient Details  Name: Patricia Campbell MRN: [id number] Date of Birth: 28-Feb-1927  [facility] CM/SW [place], LCSW Phone Number: 05/11/2019, 10:13 AM  Clinical Narrative:    Patient's sister provided payment to [facility] and CSW sent negative COVID result. Patient able to discharge there today.    Expected Discharge Plan: Skilled Nursing Facility Barriers to Discharge: Insurance Authorization  Expected Discharge Plan and Services Expected Discharge Plan: [place] In-house Referral: Clinical Social Work   Post Acute Care Choice: [place] Living arrangements for the past 2 months: Single Family Home Expected Discharge Date: 05/11/19                         [facility] Arranged: NA           Social Determinants of Health (SDOH) Interventions    Readmission Risk Interventions No flowsheet data found.

## 2019-05-11 NOTE — Discharge Summary (Signed)
Discharge Summary  Patricia Campbell RAQ:[id number] DOB: 14-Mar-1927  PCP: Patricia Guys, MD  Admit date: 04/28/2019 Discharge date: 05/11/2019  Time spent: 35 minutes  Recommendations for Outpatient Follow-up:  1. Please follow-up with your primary care provider 2. Continue physical therapy 3. Full precautions 4. Take your medications as prescribed.  Discharge Diagnoses:  Active Hospital Problems   Diagnosis Date Noted   UTI (urinary tract infection) 04/28/2019   Protein-calorie malnutrition, severe 05/08/2019   Acute encephalopathy 04/29/2019   Syncope 04/29/2019   Hypertensive urgency 04/29/2019   Pressure injury of skin 04/28/2019    Resolved Hospital Problems  No resolved problems to display.    Discharge Condition: Stable  Diet recommendation: Resume previous diet  Vitals:   05/10/19 2159 05/11/19 0523  BP: (!) 125/51 (!) 120/54  Pulse: 78 77  Resp: 12 18  Temp: 98.3 F (36.8 C) 99 F (37.2 C)  SpO2: 95% 92%    History of present illness:  HPI on 04/28/2019 by Dr. [name] a 85 y.o.femalewith medical history significant ofthyroid cancer status post thyroidectomy, degenerative disc disease, osteoporosis presenting to the hospital via EMS for evaluation of altered mental status and syncope. Per his EMS report, patient lives alone at home and had a fall witnessed by family. No loss of consciousness. Upon arrival of the fire department, patient had a syncopal episode. Hypotensive per EMS and a 500 cc bolus was given. Patient is confused and oriented to self only. No history could be obtained from her.No family available at this time.  Interim history Admitted for Ecoli UTI and AMS. Completed antibiotics.  PT assessed and recommended SNF.  05/11/19: Patient seen and examined at her bedside.  She is alert and interactve.  No acute events overnight.  Vital signs and labs reviewed and are stable. Possible discharge to SNF today  pending bed placement.  Repeat COVID-19 send out test negative.  On the day of discharge, the patient was hemodynamically stable.  She will need to follow-up with her primary care provider posthospitalization.  Fall precautions.     Hospital Course:  Principal Problem:   UTI (urinary tract infection) Active Problems:   Pressure injury of skin   Acute encephalopathy   Syncope   Hypertensive urgency   Protein-calorie malnutrition, severe  Resolved E. coli urinary tract infection -Presented with urine analysis positive for pyuria -Urine culture >100K EColi -Completed 10 days of Keflex 500 mg twice daily   Resolving acute metabolic encephalopathy superimposed on dementia -CT head with no acute findings, showed atrophy with chronic microvascular disease, likely has some underlying dementia. No focal neurological deficits. -She still has waxing and waning mental status with sundowning. Per sister, she is confused most of the time due to dementia.  -Continue low-dose Remeron. Reorient as needed  Fall, syncope  -No seizure activity -PT evaluation recommended skilled nursing facility: "Patient seen for mobility progression. Pt is making progress toward PT goals and tolerated gait distance of 30 ft with min A and RW. Pt is oriented to self only.Continue to progress as tolerated with anticipated d/c to SNF for further skilled PT services." -2Decho showed EF KT>62%, mild diastolic dysfunction Positive orthostatic vital signs Avoid dehydration Fall precautions Continue physical therapy  Elevated troponins likely demand ischemia -Likely due to hypertensive urgency, demand ischemia, no chest pain or shortness of breath on exam -2D echoshowed preserved EF and mild diastolic dysfunction  Hypertensive urgency Blood pressure is currently normotensive Continue atenolol  Elevated LFTs -  Mildly elevated AST -RUQ US showed normal gallbladder with no cholelithiasis, no  intrahepatic biliary ductal dilatation  History of thyroidectomy, on thyroid hormone replacement therapy -Continue home Synthroid -Free T4 mildly elevated 1.3, TSH 16.0 Follow-up with your primary care provider  Moderate protein calorie malnutrition -in the context of chronic illness -Albumin 2.7 on 05/08/2019 BMI 17 -Continue oral feeding supplement  Physical debility/ambulatory dysfunction PT recommended SNF Fall precautions Continue PT    Code Status:DNR    Consultants PT OT  Procedures  None    Discharge Exam: BP (!) 120/54    Pulse 77    Temp 99 F (37.2 C) (Oral)    Resp 18    Ht 5\' 1"  (1.549 m)    Wt 41 kg    SpO2 92%    BMI 17.08 kg/m   General: 85 y.o. year-old female well developed well nourished in no acute distress.  Alert and interactive.  Cardiovascular: Regular rate and rhythm with no rubs or gallops.  No thyromegaly or JVD noted.    Respiratory: Clear to auscultation with no wheezes or rales. Good inspiratory effort.  Abdomen: Soft nontender nondistended with normal bowel sounds x4 quadrants.  Musculoskeletal: No lower extremity edema. 2/4 pulses in all 4 extremities.  Psychiatry: Mood is appropriate for condition and setting  Discharge Instructions You were cared for by a hospitalist during your hospital stay. If you have any questions about your discharge medications or the care you received while you were in the hospital after you are discharged, you can call the unit and asked to speak with the hospitalist on call if the hospitalist that took care of you is not available. Once you are discharged, your primary care physician will handle any further medical issues. Please note that NO REFILLS for any discharge medications will be authorized once you are discharged, as it is imperative that you return to your primary care physician (or establish a relationship with a primary care physician if you do not have one) for your aftercare needs so  that they can reassess your need for medications and monitor your lab values.   Allergies as of 05/11/2019      Reactions   Codeine Other (See Comments)      Medication List    STOP taking these medications   meloxicam 7.5 MG tablet Commonly known as: MOBIC     TAKE these medications   Alfalfa 500 MG Tabs Take 1,000 mg by mouth daily.   aspirin 81 MG EC tablet Take 1 tablet (81 mg total) by mouth daily. Start taking on: May 12, 2019   atenolol 25 MG tablet Commonly known as: TENORMIN Take 1 tablet (25 mg total) by mouth daily. Start taking on: May 12, 2019   balanced salts Soln   CALCIUM PO Take 1 tablet by mouth daily.   Co Q-10 100 MG Caps Take 100 mg by mouth daily.   feeding supplement (ENSURE ENLIVE) Liqd Take 3,318 mLs by mouth 2 (two) times daily between meals for 7 days.   levothyroxine 75 MCG tablet Commonly known as: SYNTHROID TAKE 1 TABLET BY MOUTH EVERY MORNING ON AN EMPTY STOMACH What changed: See the new instructions.   MAGNESIUM PO Take 1 tablet by mouth daily.   mirtazapine 7.5 MG tablet Commonly known as: REMERON Take 1 tablet (7.5 mg total) by mouth at bedtime.   OCUVITE PO Take 1-2 tablets by mouth daily. What changed: Another medication with the same name was removed. Continue taking this  medication, and follow the directions you see here.   One-A-Day Proactive 65+ Tabs Take 1 tablet by mouth daily. What changed: Another medication with the same name was removed. Continue taking this medication, and follow the directions you see here.   OVER THE COUNTER MEDICATION Take 1-2 capsules by mouth See admin instructions. Focus Factor capsules: Take 1-2 capsules by mouth daily   vitamin C 500 MG tablet Commonly known as: ASCORBIC ACID Take 500 mg by mouth daily.   Vitamin D3 50 MCG (2000 UT) Tabs Take 2,000 Units by mouth daily.   vitamin E 400 UNIT capsule Take 400 Units by mouth daily.      Allergies  Allergen Reactions    Codeine Other (See Comments)    Contact information for follow-up providers    Patricia Guys, MD. Call in 1 day(s).   Specialty: Family Medicine Why: Please call for a post hospital follow-up appointment. Contact information: [place] [address] [phone]            Contact information for after-discharge care    Destination    [facility] .   Service: Skilled Nursing Contact information: [address] [phone]                   The results of significant diagnostics from this hospitalization (including imaging, microbiology, ancillary and laboratory) are listed below for reference.    Significant Diagnostic Studies: Dg Knee 2 Views Left  Result Date: 04/28/2019 CLINICAL DATA:  Fall EXAM: LEFT KNEE - 1-2 VIEW COMPARISON:  03/30/2017 FINDINGS: No fracture or dislocation. Small knee effusion. Mild patellofemoral and medial joint space degenerative change. Moderate severe degenerative change of the lateral joint space with subarticular sclerosis and prominent spurring. IMPRESSION: 1. Arthritis of the knee without acute osseous abnormality 2. Small knee effusion Electronically Signed   By: Donavan Foil M.D.   On: 04/28/2019 19:07   Dg Abd 1 View  Result Date: 04/28/2019 CLINICAL DATA:  Abdominal pain EXAM: ABDOMEN - 1 VIEW COMPARISON:  09/21/2013 FINDINGS: Severe scoliosis of the spine. Nonobstructed bowel-gas pattern. No radiopaque calculi. IMPRESSION: Nonobstructed bowel-gas pattern. Electronically Signed   By: Donavan Foil M.D.   On: 04/28/2019 21:27   Ct Head Wo Contrast  Result Date: 04/28/2019 CLINICAL DATA:  Status post fall. Syncopal episode. EXAM: CT HEAD WITHOUT CONTRAST CT CERVICAL SPINE WITHOUT CONTRAST TECHNIQUE: Multidetector CT imaging of the head and cervical spine was performed following the standard protocol without intravenous contrast. Multiplanar CT image  reconstructions of the cervical spine were also generated. COMPARISON:  None. FINDINGS: CT HEAD FINDINGS Brain: No evidence of acute infarction, hemorrhage, hydrocephalus, extra-axial collection or mass lesion/mass effect. Moderate brain parenchymal volume loss and deep white matter microangiopathy. Vascular: Calcific atherosclerotic disease at the skull base. Skull: Normal. Negative for fracture or focal lesion. Sinuses/Orbits: Partial opacification of the right mastoid air cells. Mucosal thickening of the bilateral ethmoid sinuses. Other: None. CT CERVICAL SPINE FINDINGS Alignment: C4-C5 2 mm anterolisthesis, likely degenerative. Skull base and vertebrae: No acute fracture. No primary bone lesion or focal pathologic process. Soft tissues and spinal canal: No prevertebral fluid or swelling. No visible canal hematoma. Disc levels:  Multilevel osteoarthritic changes. Upper chest: Biapical scarring. Other: None. IMPRESSION: 1. No acute intracranial abnormality. 2. Atrophy, chronic microvascular disease. 3. No evidence of acute traumatic injury to cervical spine. 4. Multilevel osteoarthritic changes of the cervical spine. 5. Partial opacification of the  right mastoid air cells. Ethmoid sinusitis. Electronically Signed   By: Fidela Salisbury M.D.   On: 04/28/2019 18:16   Ct Cervical Spine Wo Contrast  Result Date: 04/28/2019 CLINICAL DATA:  Status post fall. Syncopal episode. EXAM: CT HEAD WITHOUT CONTRAST CT CERVICAL SPINE WITHOUT CONTRAST TECHNIQUE: Multidetector CT imaging of the head and cervical spine was performed following the standard protocol without intravenous contrast. Multiplanar CT image reconstructions of the cervical spine were also generated. COMPARISON:  None. FINDINGS: CT HEAD FINDINGS Brain: No evidence of acute infarction, hemorrhage, hydrocephalus, extra-axial collection or mass lesion/mass effect. Moderate brain parenchymal volume loss and deep white matter microangiopathy. Vascular:  Calcific atherosclerotic disease at the skull base. Skull: Normal. Negative for fracture or focal lesion. Sinuses/Orbits: Partial opacification of the right mastoid air cells. Mucosal thickening of the bilateral ethmoid sinuses. Other: None. CT CERVICAL SPINE FINDINGS Alignment: C4-C5 2 mm anterolisthesis, likely degenerative. Skull base and vertebrae: No acute fracture. No primary bone lesion or focal pathologic process. Soft tissues and spinal canal: No prevertebral fluid or swelling. No visible canal hematoma. Disc levels:  Multilevel osteoarthritic changes. Upper chest: Biapical scarring. Other: None. IMPRESSION: 1. No acute intracranial abnormality. 2. Atrophy, chronic microvascular disease. 3. No evidence of acute traumatic injury to cervical spine. 4. Multilevel osteoarthritic changes of the cervical spine. 5. Partial opacification of the right mastoid air cells. Ethmoid sinusitis. Electronically Signed   By: Fidela Salisbury M.D.   On: 04/28/2019 18:16   Dg Chest Portable 1 View  Result Date: 04/28/2019 CLINICAL DATA:  Altered mental status, fall EXAM: PORTABLE CHEST 1 VIEW COMPARISON:  06/10/2006 FINDINGS: Pleural and parenchymal scarring at the apices. No acute airspace disease or effusion. Stable cardiomediastinal silhouette with aortic atherosclerosis. No pneumothorax. IMPRESSION: No active disease.  Stable scarring at the apices. Electronically Signed   By: Donavan Foil M.D.   On: 04/28/2019 19:06   US Abdomen Limited Ruq  Result Date: 04/29/2019 CLINICAL DATA:  Elevated liver function tests.  Inpatient. EXAM: ULTRASOUND ABDOMEN LIMITED RIGHT UPPER QUADRANT COMPARISON:  None. FINDINGS: Gallbladder: No gallstones or wall thickening visualized. No sonographic Murphy sign noted by sonographer. Common bile duct: Not visualized. Examination limited by patient body habitus. No intrahepatic biliary ductal dilatation appreciated. Liver: No focal lesion identified. Within normal limits in parenchymal  echogenicity. Portal vein is patent on color Doppler imaging with normal direction of blood flow towards the liver. IMPRESSION: 1. Normal gallbladder with no cholelithiasis. 2. Common bile duct unable to be visualized. No intrahepatic biliary ductal dilatation appreciated. If there is clinical concern for biliary obstruction, CT abdomen/pelvis with oral and IV contrast could be obtained for further evaluation. 3. Normal liver. Electronically Signed   By: Ilona Sorrel M.D.   On: 04/29/2019 07:18    Microbiology: Recent Results (from the past 240 hour(s))  Novel Coronavirus, NAA (hospital order; send-out to ref lab)     Status: None   Collection Time: 05/02/19  2:54 PM   Specimen: Nasopharyngeal Swab; Respiratory  Result Value Ref Range Status   SARS-CoV-2, NAA NOT DETECTED NOT DETECTED Final    Comment: (NOTE) This test was developed and its performance characteristics determined by [employer]. This test has not been FDA cleared or approved. This test has been authorized by FDA under an Emergency Use Authorization (EUA). This test is only authorized for the duration of time the declaration that circumstances exist justifying the authorization of the emergency use of in vitro diagnostic tests for detection of SARS-CoV-2 virus and/or  diagnosis of COVID-19 infection under section 564(b)(1) of the Act, 21 U.S.C. 098JXB-1(Y)(7), unless the authorization is terminated or revoked sooner. When diagnostic testing is negative, the possibility of a false negative result should be considered in the context of a patient's recent exposures and the presence of clinical signs and symptoms consistent with COVID-19. An individual without symptoms of COVID-19 and who is not shedding SARS-CoV-2 virus would expect to have a negative (not detected) result in this assay. Performed  At: [facility] [address] [place], [place] [id number] Rush Farmer MD [id number]    [place]  Final    Comment: Performed at [facility], [place] [address], [place], [place] [address]  Novel Coronavirus, NAA (hospital order; send-out to ref lab)     Status: None   Collection Time: 05/09/19 12:33 PM   Specimen: Nasopharyngeal Swab; Respiratory  Result Value Ref Range Status   SARS-CoV-2, NAA NOT DETECTED NOT DETECTED Final    Comment: (NOTE) This test was developed and its performance characteristics determined by [employer]. This test has not been FDA cleared or approved. This test has been authorized by FDA under an Emergency Use Authorization (EUA). This test is only authorized for the duration of time the declaration that circumstances exist justifying the authorization of the emergency use of in vitro diagnostic tests for detection of SARS-CoV-2 virus and/or diagnosis of COVID-19 infection under section 564(b)(1) of the Act, 21 U.S.C. 244WNU-2(V)(2), unless the authorization is terminated or revoked sooner. When diagnostic testing is negative, the possibility of a false negative result should be considered in the context of a patient's recent exposures and the presence of clinical signs and symptoms consistent with COVID-19. An individual without symptoms of COVID-19 and who is not shedding SARS-CoV-2 virus would expect to have a negative (not detected) result in this assay. Performed  At: [facility] [address] [place], [place] [id number] Rush Farmer MD [id number]    [place]  Final    Comment: Performed at [facility], [place] [address] [place], [place] [address]     Labs: Basic Metabolic Panel: Recent Labs  Lab 05/08/19 0323  NA 137  K 4.4  CL 103  CO2 25  GLUCOSE 96  BUN 44*  CREATININE 0.97  CALCIUM 8.0*   Liver Function Tests: Recent Labs  Lab 05/08/19 0323  AST 32  ALT 27  ALKPHOS 40  BILITOT 0.3  PROT 5.1*  ALBUMIN 2.7*   No results for input(s): LIPASE,  AMYLASE in the last 168 hours. No results for input(s): AMMONIA in the last 168 hours. CBC: No results for input(s): WBC, NEUTROABS, HGB, HCT, MCV, PLT in the last 168 hours. Cardiac Enzymes: No results for input(s): CKTOTAL, CKMB, CKMBINDEX, TROPONINI in the last 168 hours. BNP: BNP (last 3 results) No results for input(s): BNP in the last 8760 hours.  ProBNP (last 3 results) No results for input(s): PROBNP in the last 8760 hours.  CBG: No results for input(s): GLUCAP in the last 168 hours.     Signed:  Kayleen Memos, MD [facility] 05/11/2019, 9:04 AM

## 2019-05-11 NOTE — Plan of Care (Signed)
  Problem: Acute Rehab PT Goals(only PT should resolve) Goal: Pt Will Go Supine/Side To Sit Outcome: Adequate for Discharge Goal: Pt Will Go Sit To Supine/Side Outcome: Adequate for Discharge Goal: Patient Will Transfer Sit To/From Stand Outcome: Adequate for Discharge Goal: Pt Will Transfer Bed To Chair/Chair To Bed Outcome: Adequate for Discharge   Problem: Acute Rehab PT Goals(only PT should resolve) Goal: Pt Will Ambulate Outcome: Adequate for Discharge   Problem: Education: Goal: Knowledge of General Education information will improve Description: Including pain rating scale, medication(s)/side effects and non-pharmacologic comfort measures Outcome: Adequate for Discharge   Problem: Clinical Measurements: Goal: Will remain free from infection Outcome: Adequate for Discharge   Problem: Activity: Goal: Risk for activity intolerance will decrease Outcome: Adequate for Discharge   Problem: Safety: Goal: Ability to remain free from injury will improve Outcome: Adequate for Discharge

## 2019-05-12 DIAGNOSIS — D649 Anemia, unspecified: Secondary | ICD-10-CM | POA: Diagnosis not present

## 2019-05-12 DIAGNOSIS — Z79899 Other long term (current) drug therapy: Secondary | ICD-10-CM | POA: Diagnosis not present

## 2019-05-15 DIAGNOSIS — B9620 Unspecified Escherichia coli [E. coli] as the cause of diseases classified elsewhere: Secondary | ICD-10-CM | POA: Diagnosis not present

## 2019-05-15 DIAGNOSIS — N390 Urinary tract infection, site not specified: Secondary | ICD-10-CM | POA: Diagnosis not present

## 2019-05-15 DIAGNOSIS — R296 Repeated falls: Secondary | ICD-10-CM | POA: Diagnosis not present

## 2019-05-15 DIAGNOSIS — G9340 Encephalopathy, unspecified: Secondary | ICD-10-CM | POA: Diagnosis not present

## 2019-05-18 ENCOUNTER — Ambulatory Visit: Payer: Medicare HMO | Admitting: Family Medicine

## 2019-05-18 DIAGNOSIS — R5381 Other malaise: Secondary | ICD-10-CM | POA: Diagnosis not present

## 2019-05-18 DIAGNOSIS — B9620 Unspecified Escherichia coli [E. coli] as the cause of diseases classified elsewhere: Secondary | ICD-10-CM | POA: Diagnosis not present

## 2019-05-18 DIAGNOSIS — R296 Repeated falls: Secondary | ICD-10-CM | POA: Diagnosis not present

## 2019-05-18 DIAGNOSIS — N390 Urinary tract infection, site not specified: Secondary | ICD-10-CM | POA: Diagnosis not present

## 2019-05-23 DIAGNOSIS — E46 Unspecified protein-calorie malnutrition: Secondary | ICD-10-CM | POA: Diagnosis not present

## 2019-05-23 DIAGNOSIS — R296 Repeated falls: Secondary | ICD-10-CM | POA: Diagnosis not present

## 2019-05-23 DIAGNOSIS — R5381 Other malaise: Secondary | ICD-10-CM | POA: Diagnosis not present

## 2019-05-23 DIAGNOSIS — G9340 Encephalopathy, unspecified: Secondary | ICD-10-CM | POA: Diagnosis not present

## 2019-05-31 DIAGNOSIS — I10 Essential (primary) hypertension: Secondary | ICD-10-CM | POA: Diagnosis not present

## 2019-05-31 DIAGNOSIS — R5381 Other malaise: Secondary | ICD-10-CM | POA: Diagnosis not present

## 2019-05-31 DIAGNOSIS — G9340 Encephalopathy, unspecified: Secondary | ICD-10-CM | POA: Diagnosis not present

## 2019-05-31 DIAGNOSIS — E46 Unspecified protein-calorie malnutrition: Secondary | ICD-10-CM | POA: Diagnosis not present

## 2019-06-06 DIAGNOSIS — I10 Essential (primary) hypertension: Secondary | ICD-10-CM | POA: Diagnosis not present

## 2019-06-06 DIAGNOSIS — M810 Age-related osteoporosis without current pathological fracture: Secondary | ICD-10-CM | POA: Diagnosis not present

## 2019-06-06 DIAGNOSIS — R5381 Other malaise: Secondary | ICD-10-CM | POA: Diagnosis not present

## 2019-06-06 DIAGNOSIS — E46 Unspecified protein-calorie malnutrition: Secondary | ICD-10-CM | POA: Diagnosis not present

## 2019-06-08 DIAGNOSIS — B342 Coronavirus infection, unspecified: Secondary | ICD-10-CM | POA: Diagnosis not present

## 2019-06-08 DIAGNOSIS — Z20828 Contact with and (suspected) exposure to other viral communicable diseases: Secondary | ICD-10-CM | POA: Diagnosis not present

## 2019-06-14 DIAGNOSIS — I10 Essential (primary) hypertension: Secondary | ICD-10-CM | POA: Diagnosis not present

## 2019-06-14 DIAGNOSIS — M810 Age-related osteoporosis without current pathological fracture: Secondary | ICD-10-CM | POA: Diagnosis not present

## 2019-06-14 DIAGNOSIS — R5381 Other malaise: Secondary | ICD-10-CM | POA: Diagnosis not present

## 2019-06-14 DIAGNOSIS — E46 Unspecified protein-calorie malnutrition: Secondary | ICD-10-CM | POA: Diagnosis not present

## 2019-06-15 DIAGNOSIS — E119 Type 2 diabetes mellitus without complications: Secondary | ICD-10-CM | POA: Diagnosis not present

## 2019-06-15 DIAGNOSIS — I10 Essential (primary) hypertension: Secondary | ICD-10-CM | POA: Diagnosis not present

## 2019-06-15 DIAGNOSIS — R5381 Other malaise: Secondary | ICD-10-CM | POA: Diagnosis not present

## 2019-06-15 DIAGNOSIS — M810 Age-related osteoporosis without current pathological fracture: Secondary | ICD-10-CM | POA: Diagnosis not present

## 2019-06-15 DIAGNOSIS — E039 Hypothyroidism, unspecified: Secondary | ICD-10-CM | POA: Diagnosis not present

## 2019-06-15 DIAGNOSIS — D649 Anemia, unspecified: Secondary | ICD-10-CM | POA: Diagnosis not present

## 2019-06-15 DIAGNOSIS — E46 Unspecified protein-calorie malnutrition: Secondary | ICD-10-CM | POA: Diagnosis not present

## 2019-06-15 DIAGNOSIS — E785 Hyperlipidemia, unspecified: Secondary | ICD-10-CM | POA: Diagnosis not present

## 2019-07-07 DIAGNOSIS — E039 Hypothyroidism, unspecified: Secondary | ICD-10-CM | POA: Diagnosis not present

## 2019-07-11 ENCOUNTER — Telehealth: Payer: Self-pay | Admitting: *Deleted

## 2019-07-11 NOTE — Telephone Encounter (Signed)
Schedule AWV.  

## 2019-07-13 ENCOUNTER — Telehealth: Payer: Self-pay | Admitting: Family Medicine

## 2019-07-13 DIAGNOSIS — R5381 Other malaise: Secondary | ICD-10-CM | POA: Diagnosis not present

## 2019-07-13 DIAGNOSIS — R69 Illness, unspecified: Secondary | ICD-10-CM | POA: Diagnosis not present

## 2019-07-13 DIAGNOSIS — E46 Unspecified protein-calorie malnutrition: Secondary | ICD-10-CM | POA: Diagnosis not present

## 2019-07-13 DIAGNOSIS — I10 Essential (primary) hypertension: Secondary | ICD-10-CM | POA: Diagnosis not present

## 2019-07-13 NOTE — Telephone Encounter (Signed)
In nursing home will not be needing anymore appointments

## 2019-07-14 DIAGNOSIS — I10 Essential (primary) hypertension: Secondary | ICD-10-CM | POA: Diagnosis not present

## 2019-07-14 DIAGNOSIS — M810 Age-related osteoporosis without current pathological fracture: Secondary | ICD-10-CM | POA: Diagnosis not present

## 2019-07-14 DIAGNOSIS — R5381 Other malaise: Secondary | ICD-10-CM | POA: Diagnosis not present

## 2019-07-14 DIAGNOSIS — Z03818 Encounter for observation for suspected exposure to other biological agents ruled out: Secondary | ICD-10-CM | POA: Diagnosis not present

## 2019-07-14 DIAGNOSIS — E46 Unspecified protein-calorie malnutrition: Secondary | ICD-10-CM | POA: Diagnosis not present

## 2019-07-14 DIAGNOSIS — E039 Hypothyroidism, unspecified: Secondary | ICD-10-CM | POA: Diagnosis not present

## 2019-08-02 DIAGNOSIS — R69 Illness, unspecified: Secondary | ICD-10-CM | POA: Diagnosis not present

## 2019-08-02 DIAGNOSIS — E43 Unspecified severe protein-calorie malnutrition: Secondary | ICD-10-CM | POA: Diagnosis not present

## 2019-08-03 DIAGNOSIS — E43 Unspecified severe protein-calorie malnutrition: Secondary | ICD-10-CM | POA: Diagnosis not present

## 2019-08-03 DIAGNOSIS — F0390 Unspecified dementia without behavioral disturbance: Secondary | ICD-10-CM | POA: Diagnosis not present

## 2019-08-03 DIAGNOSIS — R69 Illness, unspecified: Secondary | ICD-10-CM | POA: Diagnosis not present

## 2019-08-11 DIAGNOSIS — R69 Illness, unspecified: Secondary | ICD-10-CM | POA: Diagnosis not present

## 2019-08-11 DIAGNOSIS — E038 Other specified hypothyroidism: Secondary | ICD-10-CM | POA: Diagnosis not present

## 2019-08-11 DIAGNOSIS — R5381 Other malaise: Secondary | ICD-10-CM | POA: Diagnosis not present

## 2019-08-11 DIAGNOSIS — I10 Essential (primary) hypertension: Secondary | ICD-10-CM | POA: Diagnosis not present

## 2019-08-12 DIAGNOSIS — E039 Hypothyroidism, unspecified: Secondary | ICD-10-CM | POA: Diagnosis not present

## 2019-08-12 DIAGNOSIS — Z85850 Personal history of malignant neoplasm of thyroid: Secondary | ICD-10-CM | POA: Diagnosis not present

## 2019-08-13 DIAGNOSIS — E038 Other specified hypothyroidism: Secondary | ICD-10-CM | POA: Diagnosis not present

## 2019-08-17 DIAGNOSIS — E038 Other specified hypothyroidism: Secondary | ICD-10-CM | POA: Diagnosis not present

## 2019-08-17 DIAGNOSIS — R69 Illness, unspecified: Secondary | ICD-10-CM | POA: Diagnosis not present

## 2019-08-17 DIAGNOSIS — I10 Essential (primary) hypertension: Secondary | ICD-10-CM | POA: Diagnosis not present

## 2019-08-21 DIAGNOSIS — Z23 Encounter for immunization: Secondary | ICD-10-CM | POA: Diagnosis not present

## 2019-08-22 DIAGNOSIS — R69 Illness, unspecified: Secondary | ICD-10-CM | POA: Diagnosis not present

## 2019-08-29 DIAGNOSIS — G301 Alzheimer's disease with late onset: Secondary | ICD-10-CM | POA: Diagnosis not present

## 2019-08-29 DIAGNOSIS — E440 Moderate protein-calorie malnutrition: Secondary | ICD-10-CM | POA: Diagnosis not present

## 2019-08-31 DIAGNOSIS — F0390 Unspecified dementia without behavioral disturbance: Secondary | ICD-10-CM | POA: Diagnosis not present

## 2019-08-31 DIAGNOSIS — E43 Unspecified severe protein-calorie malnutrition: Secondary | ICD-10-CM | POA: Diagnosis not present

## 2019-08-31 DIAGNOSIS — R69 Illness, unspecified: Secondary | ICD-10-CM | POA: Diagnosis not present

## 2019-09-04 DIAGNOSIS — Z20828 Contact with and (suspected) exposure to other viral communicable diseases: Secondary | ICD-10-CM | POA: Diagnosis not present

## 2019-09-05 DIAGNOSIS — R5381 Other malaise: Secondary | ICD-10-CM | POA: Diagnosis not present

## 2019-09-05 DIAGNOSIS — M810 Age-related osteoporosis without current pathological fracture: Secondary | ICD-10-CM | POA: Diagnosis not present

## 2019-09-05 DIAGNOSIS — I10 Essential (primary) hypertension: Secondary | ICD-10-CM | POA: Diagnosis not present

## 2019-09-05 DIAGNOSIS — E038 Other specified hypothyroidism: Secondary | ICD-10-CM | POA: Diagnosis not present

## 2019-09-11 DIAGNOSIS — Z20828 Contact with and (suspected) exposure to other viral communicable diseases: Secondary | ICD-10-CM | POA: Diagnosis not present

## 2019-09-13 DIAGNOSIS — U071 COVID-19: Secondary | ICD-10-CM | POA: Diagnosis not present

## 2019-09-27 DIAGNOSIS — R55 Syncope and collapse: Secondary | ICD-10-CM | POA: Diagnosis not present

## 2019-09-27 DIAGNOSIS — R1319 Other dysphagia: Secondary | ICD-10-CM | POA: Diagnosis not present

## 2019-09-27 DIAGNOSIS — M6281 Muscle weakness (generalized): Secondary | ICD-10-CM | POA: Diagnosis not present

## 2019-09-27 DIAGNOSIS — U071 COVID-19: Secondary | ICD-10-CM | POA: Diagnosis not present

## 2019-09-27 DIAGNOSIS — R4182 Altered mental status, unspecified: Secondary | ICD-10-CM | POA: Diagnosis not present

## 2019-09-27 DIAGNOSIS — R2689 Other abnormalities of gait and mobility: Secondary | ICD-10-CM | POA: Diagnosis not present

## 2019-09-27 DIAGNOSIS — R488 Other symbolic dysfunctions: Secondary | ICD-10-CM | POA: Diagnosis not present

## 2019-09-27 DIAGNOSIS — B9620 Unspecified Escherichia coli [E. coli] as the cause of diseases classified elsewhere: Secondary | ICD-10-CM | POA: Diagnosis not present

## 2019-09-28 DIAGNOSIS — R2689 Other abnormalities of gait and mobility: Secondary | ICD-10-CM | POA: Diagnosis not present

## 2019-09-28 DIAGNOSIS — R4182 Altered mental status, unspecified: Secondary | ICD-10-CM | POA: Diagnosis not present

## 2019-09-28 DIAGNOSIS — R1319 Other dysphagia: Secondary | ICD-10-CM | POA: Diagnosis not present

## 2019-09-28 DIAGNOSIS — M6281 Muscle weakness (generalized): Secondary | ICD-10-CM | POA: Diagnosis not present

## 2019-09-28 DIAGNOSIS — R488 Other symbolic dysfunctions: Secondary | ICD-10-CM | POA: Diagnosis not present

## 2019-09-28 DIAGNOSIS — R55 Syncope and collapse: Secondary | ICD-10-CM | POA: Diagnosis not present

## 2019-09-28 DIAGNOSIS — B9620 Unspecified Escherichia coli [E. coli] as the cause of diseases classified elsewhere: Secondary | ICD-10-CM | POA: Diagnosis not present

## 2019-09-28 DIAGNOSIS — U071 COVID-19: Secondary | ICD-10-CM | POA: Diagnosis not present

## 2019-09-29 DIAGNOSIS — R2689 Other abnormalities of gait and mobility: Secondary | ICD-10-CM | POA: Diagnosis not present

## 2019-09-29 DIAGNOSIS — R4182 Altered mental status, unspecified: Secondary | ICD-10-CM | POA: Diagnosis not present

## 2019-09-29 DIAGNOSIS — R1319 Other dysphagia: Secondary | ICD-10-CM | POA: Diagnosis not present

## 2019-09-29 DIAGNOSIS — R488 Other symbolic dysfunctions: Secondary | ICD-10-CM | POA: Diagnosis not present

## 2019-09-29 DIAGNOSIS — U071 COVID-19: Secondary | ICD-10-CM | POA: Diagnosis not present

## 2019-09-29 DIAGNOSIS — M6281 Muscle weakness (generalized): Secondary | ICD-10-CM | POA: Diagnosis not present

## 2019-09-29 DIAGNOSIS — R55 Syncope and collapse: Secondary | ICD-10-CM | POA: Diagnosis not present

## 2019-09-29 DIAGNOSIS — B9620 Unspecified Escherichia coli [E. coli] as the cause of diseases classified elsewhere: Secondary | ICD-10-CM | POA: Diagnosis not present

## 2019-10-02 DIAGNOSIS — U071 COVID-19: Secondary | ICD-10-CM | POA: Diagnosis not present

## 2019-10-02 DIAGNOSIS — B9620 Unspecified Escherichia coli [E. coli] as the cause of diseases classified elsewhere: Secondary | ICD-10-CM | POA: Diagnosis not present

## 2019-10-02 DIAGNOSIS — R55 Syncope and collapse: Secondary | ICD-10-CM | POA: Diagnosis not present

## 2019-10-02 DIAGNOSIS — M6281 Muscle weakness (generalized): Secondary | ICD-10-CM | POA: Diagnosis not present

## 2019-10-02 DIAGNOSIS — R2689 Other abnormalities of gait and mobility: Secondary | ICD-10-CM | POA: Diagnosis not present

## 2019-10-02 DIAGNOSIS — R1319 Other dysphagia: Secondary | ICD-10-CM | POA: Diagnosis not present

## 2019-10-02 DIAGNOSIS — R4182 Altered mental status, unspecified: Secondary | ICD-10-CM | POA: Diagnosis not present

## 2019-10-02 DIAGNOSIS — R488 Other symbolic dysfunctions: Secondary | ICD-10-CM | POA: Diagnosis not present

## 2019-10-03 DIAGNOSIS — M6281 Muscle weakness (generalized): Secondary | ICD-10-CM | POA: Diagnosis not present

## 2019-10-03 DIAGNOSIS — U071 COVID-19: Secondary | ICD-10-CM | POA: Diagnosis not present

## 2019-10-03 DIAGNOSIS — R4182 Altered mental status, unspecified: Secondary | ICD-10-CM | POA: Diagnosis not present

## 2019-10-03 DIAGNOSIS — R2689 Other abnormalities of gait and mobility: Secondary | ICD-10-CM | POA: Diagnosis not present

## 2019-10-03 DIAGNOSIS — R1319 Other dysphagia: Secondary | ICD-10-CM | POA: Diagnosis not present

## 2019-10-03 DIAGNOSIS — B9620 Unspecified Escherichia coli [E. coli] as the cause of diseases classified elsewhere: Secondary | ICD-10-CM | POA: Diagnosis not present

## 2019-10-03 DIAGNOSIS — R488 Other symbolic dysfunctions: Secondary | ICD-10-CM | POA: Diagnosis not present

## 2019-10-03 DIAGNOSIS — R55 Syncope and collapse: Secondary | ICD-10-CM | POA: Diagnosis not present

## 2019-10-04 DIAGNOSIS — R55 Syncope and collapse: Secondary | ICD-10-CM | POA: Diagnosis not present

## 2019-10-04 DIAGNOSIS — B9620 Unspecified Escherichia coli [E. coli] as the cause of diseases classified elsewhere: Secondary | ICD-10-CM | POA: Diagnosis not present

## 2019-10-04 DIAGNOSIS — R2689 Other abnormalities of gait and mobility: Secondary | ICD-10-CM | POA: Diagnosis not present

## 2019-10-04 DIAGNOSIS — G4701 Insomnia due to medical condition: Secondary | ICD-10-CM | POA: Diagnosis not present

## 2019-10-04 DIAGNOSIS — E038 Other specified hypothyroidism: Secondary | ICD-10-CM | POA: Diagnosis not present

## 2019-10-04 DIAGNOSIS — R69 Illness, unspecified: Secondary | ICD-10-CM | POA: Diagnosis not present

## 2019-10-04 DIAGNOSIS — R4182 Altered mental status, unspecified: Secondary | ICD-10-CM | POA: Diagnosis not present

## 2019-10-04 DIAGNOSIS — M6281 Muscle weakness (generalized): Secondary | ICD-10-CM | POA: Diagnosis not present

## 2019-10-04 DIAGNOSIS — R488 Other symbolic dysfunctions: Secondary | ICD-10-CM | POA: Diagnosis not present

## 2019-10-04 DIAGNOSIS — R1319 Other dysphagia: Secondary | ICD-10-CM | POA: Diagnosis not present

## 2019-10-04 DIAGNOSIS — I10 Essential (primary) hypertension: Secondary | ICD-10-CM | POA: Diagnosis not present

## 2019-10-04 DIAGNOSIS — R627 Adult failure to thrive: Secondary | ICD-10-CM | POA: Diagnosis not present

## 2019-10-04 DIAGNOSIS — U071 COVID-19: Secondary | ICD-10-CM | POA: Diagnosis not present

## 2019-10-05 DIAGNOSIS — R1319 Other dysphagia: Secondary | ICD-10-CM | POA: Diagnosis not present

## 2019-10-05 DIAGNOSIS — M6281 Muscle weakness (generalized): Secondary | ICD-10-CM | POA: Diagnosis not present

## 2019-10-05 DIAGNOSIS — R55 Syncope and collapse: Secondary | ICD-10-CM | POA: Diagnosis not present

## 2019-10-05 DIAGNOSIS — R2689 Other abnormalities of gait and mobility: Secondary | ICD-10-CM | POA: Diagnosis not present

## 2019-10-05 DIAGNOSIS — B9620 Unspecified Escherichia coli [E. coli] as the cause of diseases classified elsewhere: Secondary | ICD-10-CM | POA: Diagnosis not present

## 2019-10-05 DIAGNOSIS — R488 Other symbolic dysfunctions: Secondary | ICD-10-CM | POA: Diagnosis not present

## 2019-10-05 DIAGNOSIS — U071 COVID-19: Secondary | ICD-10-CM | POA: Diagnosis not present

## 2019-10-05 DIAGNOSIS — R4182 Altered mental status, unspecified: Secondary | ICD-10-CM | POA: Diagnosis not present

## 2019-10-06 DIAGNOSIS — R55 Syncope and collapse: Secondary | ICD-10-CM | POA: Diagnosis not present

## 2019-10-06 DIAGNOSIS — R1319 Other dysphagia: Secondary | ICD-10-CM | POA: Diagnosis not present

## 2019-10-06 DIAGNOSIS — R2689 Other abnormalities of gait and mobility: Secondary | ICD-10-CM | POA: Diagnosis not present

## 2019-10-06 DIAGNOSIS — R488 Other symbolic dysfunctions: Secondary | ICD-10-CM | POA: Diagnosis not present

## 2019-10-06 DIAGNOSIS — U071 COVID-19: Secondary | ICD-10-CM | POA: Diagnosis not present

## 2019-10-06 DIAGNOSIS — R4182 Altered mental status, unspecified: Secondary | ICD-10-CM | POA: Diagnosis not present

## 2019-10-06 DIAGNOSIS — M6281 Muscle weakness (generalized): Secondary | ICD-10-CM | POA: Diagnosis not present

## 2019-10-06 DIAGNOSIS — B9620 Unspecified Escherichia coli [E. coli] as the cause of diseases classified elsewhere: Secondary | ICD-10-CM | POA: Diagnosis not present

## 2019-10-08 DIAGNOSIS — R488 Other symbolic dysfunctions: Secondary | ICD-10-CM | POA: Diagnosis not present

## 2019-10-08 DIAGNOSIS — R55 Syncope and collapse: Secondary | ICD-10-CM | POA: Diagnosis not present

## 2019-10-08 DIAGNOSIS — B9620 Unspecified Escherichia coli [E. coli] as the cause of diseases classified elsewhere: Secondary | ICD-10-CM | POA: Diagnosis not present

## 2019-10-08 DIAGNOSIS — M6281 Muscle weakness (generalized): Secondary | ICD-10-CM | POA: Diagnosis not present

## 2019-10-08 DIAGNOSIS — R2689 Other abnormalities of gait and mobility: Secondary | ICD-10-CM | POA: Diagnosis not present

## 2019-10-08 DIAGNOSIS — U071 COVID-19: Secondary | ICD-10-CM | POA: Diagnosis not present

## 2019-10-08 DIAGNOSIS — R4182 Altered mental status, unspecified: Secondary | ICD-10-CM | POA: Diagnosis not present

## 2019-10-08 DIAGNOSIS — R1319 Other dysphagia: Secondary | ICD-10-CM | POA: Diagnosis not present

## 2019-10-09 DIAGNOSIS — B9620 Unspecified Escherichia coli [E. coli] as the cause of diseases classified elsewhere: Secondary | ICD-10-CM | POA: Diagnosis not present

## 2019-10-09 DIAGNOSIS — R1319 Other dysphagia: Secondary | ICD-10-CM | POA: Diagnosis not present

## 2019-10-09 DIAGNOSIS — R2689 Other abnormalities of gait and mobility: Secondary | ICD-10-CM | POA: Diagnosis not present

## 2019-10-09 DIAGNOSIS — M6281 Muscle weakness (generalized): Secondary | ICD-10-CM | POA: Diagnosis not present

## 2019-10-09 DIAGNOSIS — R4182 Altered mental status, unspecified: Secondary | ICD-10-CM | POA: Diagnosis not present

## 2019-10-09 DIAGNOSIS — U071 COVID-19: Secondary | ICD-10-CM | POA: Diagnosis not present

## 2019-10-09 DIAGNOSIS — R488 Other symbolic dysfunctions: Secondary | ICD-10-CM | POA: Diagnosis not present

## 2019-10-09 DIAGNOSIS — R55 Syncope and collapse: Secondary | ICD-10-CM | POA: Diagnosis not present

## 2019-10-18 DIAGNOSIS — R69 Illness, unspecified: Secondary | ICD-10-CM | POA: Diagnosis not present

## 2019-10-18 DIAGNOSIS — I10 Essential (primary) hypertension: Secondary | ICD-10-CM | POA: Diagnosis not present

## 2019-11-02 DIAGNOSIS — I10 Essential (primary) hypertension: Secondary | ICD-10-CM | POA: Diagnosis not present

## 2019-11-02 DIAGNOSIS — U071 COVID-19: Secondary | ICD-10-CM | POA: Diagnosis not present

## 2019-11-02 DIAGNOSIS — E038 Other specified hypothyroidism: Secondary | ICD-10-CM | POA: Diagnosis not present

## 2019-11-02 DIAGNOSIS — R69 Illness, unspecified: Secondary | ICD-10-CM | POA: Diagnosis not present

## 2019-11-09 DIAGNOSIS — R627 Adult failure to thrive: Secondary | ICD-10-CM | POA: Diagnosis not present

## 2019-11-09 DIAGNOSIS — R69 Illness, unspecified: Secondary | ICD-10-CM | POA: Diagnosis not present

## 2019-11-09 DIAGNOSIS — G4701 Insomnia due to medical condition: Secondary | ICD-10-CM | POA: Diagnosis not present

## 2019-11-23 DIAGNOSIS — R69 Illness, unspecified: Secondary | ICD-10-CM | POA: Diagnosis not present

## 2020-09-05 IMAGING — CT CT CERVICAL SPINE WITHOUT CONTRAST
5 of 8 series · 14 of 33 positions shown, 15 images · non-contrast
Comparison: None.

CLINICAL DATA: Status post fall. Syncopal episode.

EXAM:
CT HEAD WITHOUT CONTRAST
CT CERVICAL SPINE WITHOUT CONTRAST
TECHNIQUE: Multidetector CT imaging of the head and cervical spine was
performed following the standard protocol without intravenous
contrast. Multiplanar CT image reconstructions of the cervical spine
were also generated.

[Series 5: head bone · axial · 0.41mm/px · z∈[-80,-20]mm · 2 of 90 slices shown]
[im 30/90  bone]
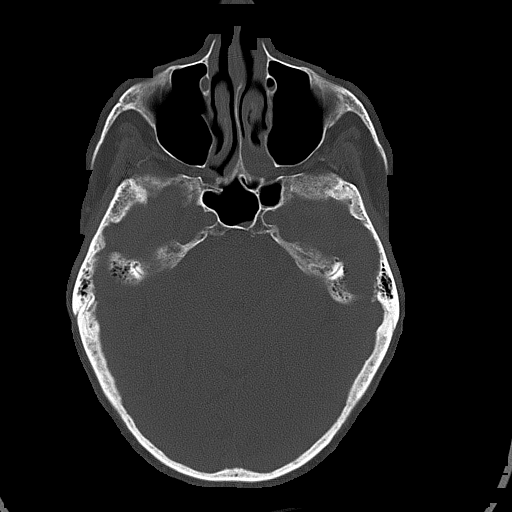
[im 60/90  bone]
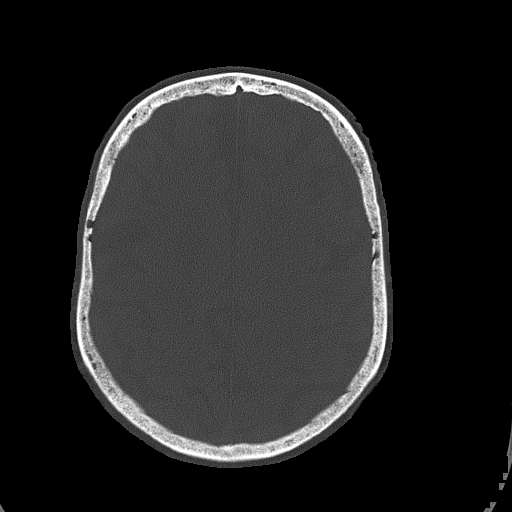

[Series 6: head without cor · coronal · non-contrast · 0.35mm/px · 3 of 67 slices shown]
[im 17/67  bone]
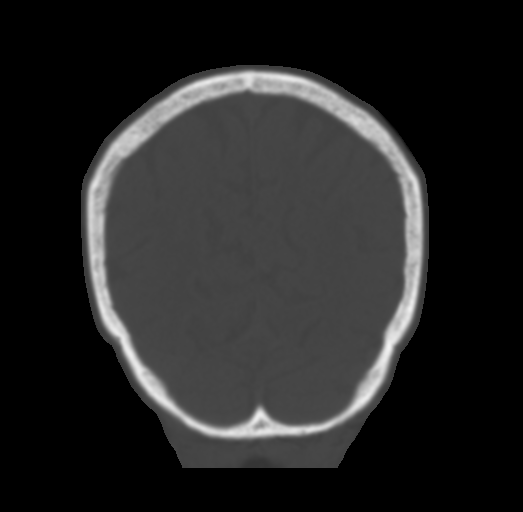
[im 34/67  bone]
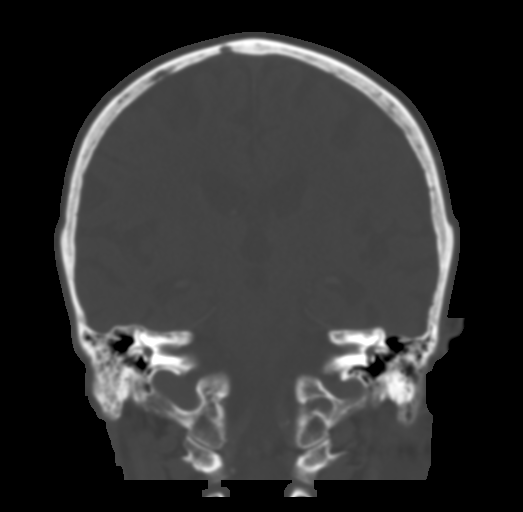
[im 50/67  bone]
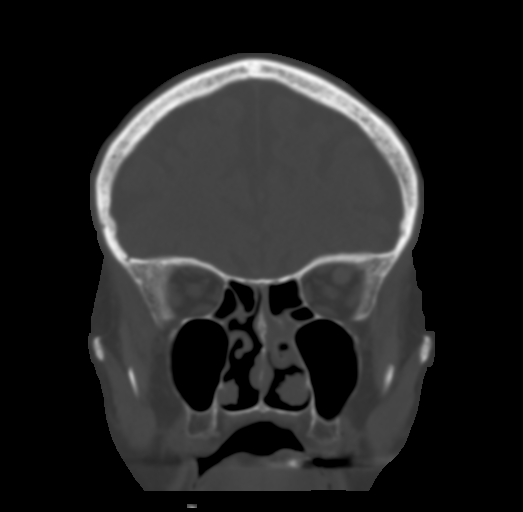

[Series 8: c_spine 2.0 st · axial · 0.28mm/px · z∈[-182,-128]mm · 2 of 82 slices shown, 3 images]
[im 28/82  soft-tissue]
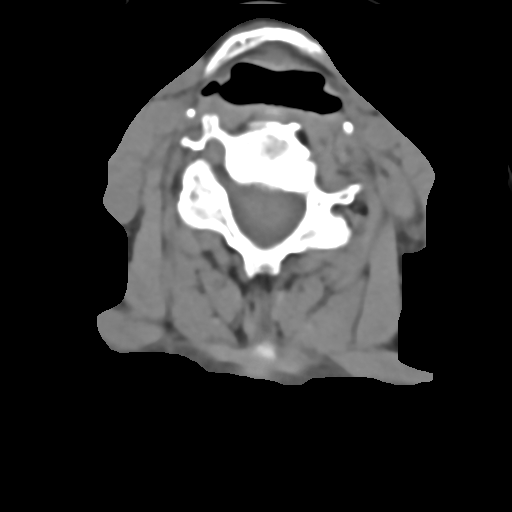
[im 28/82  bone]
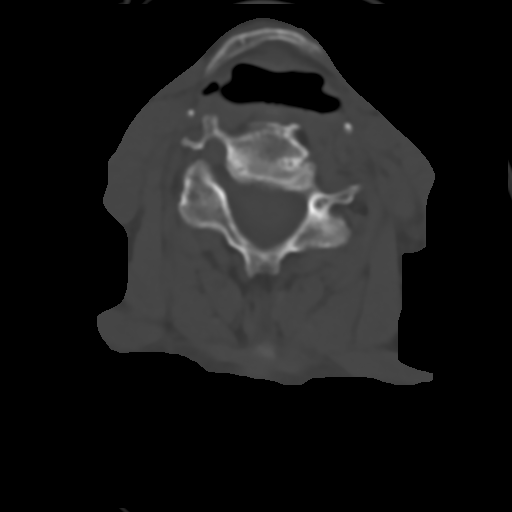
[im 55/82  bone]
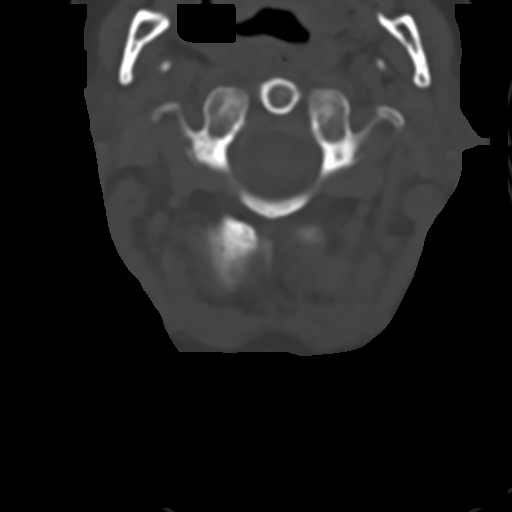

[Series 12: c_spine 2.0 sag bone · sagittal · 0.24mm/px · 5 of 61 slices shown]
[im 11/61  bone]
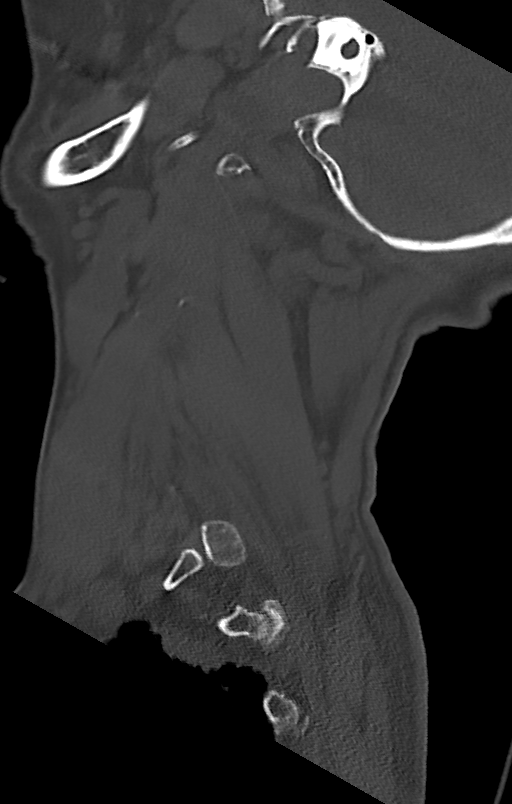
[im 21/61  bone]
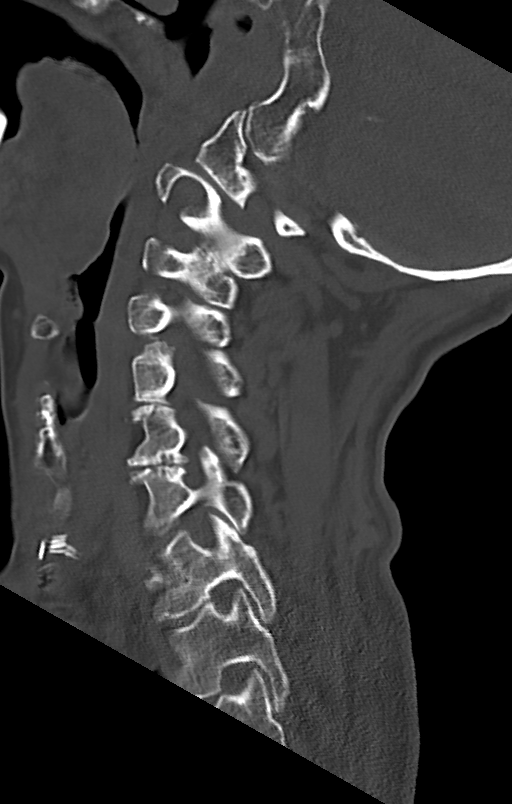
[im 31/61  bone]
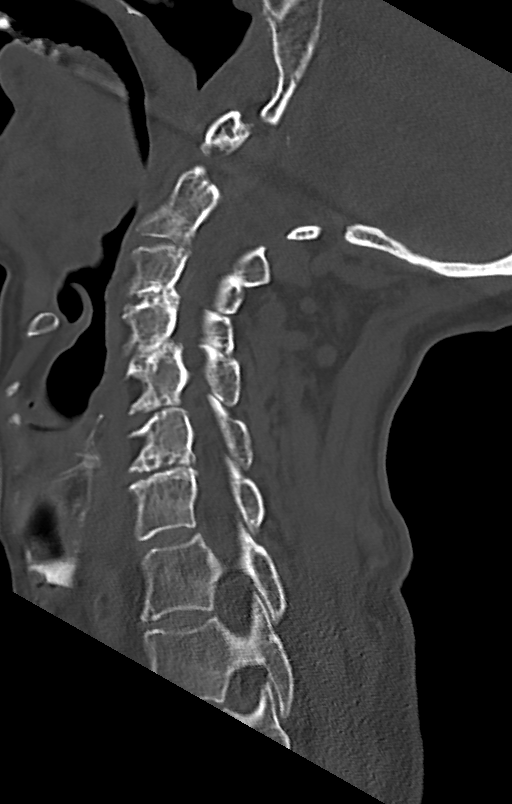
[im 41/61  bone]
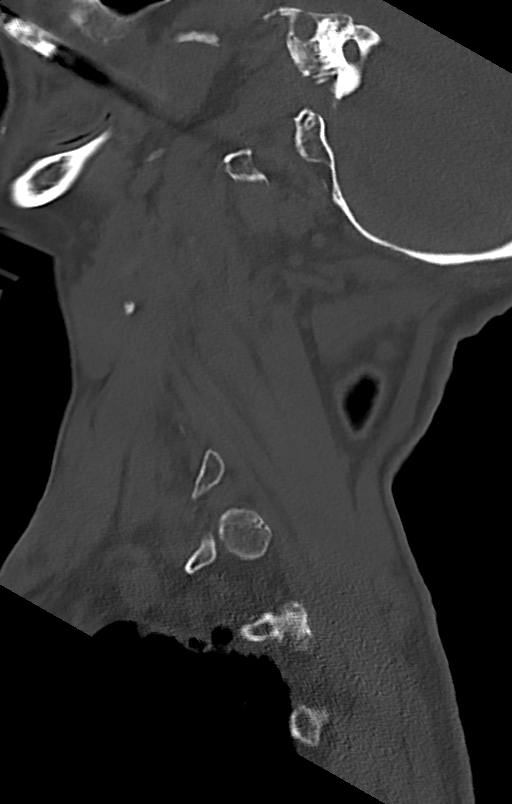
[im 51/61  bone]
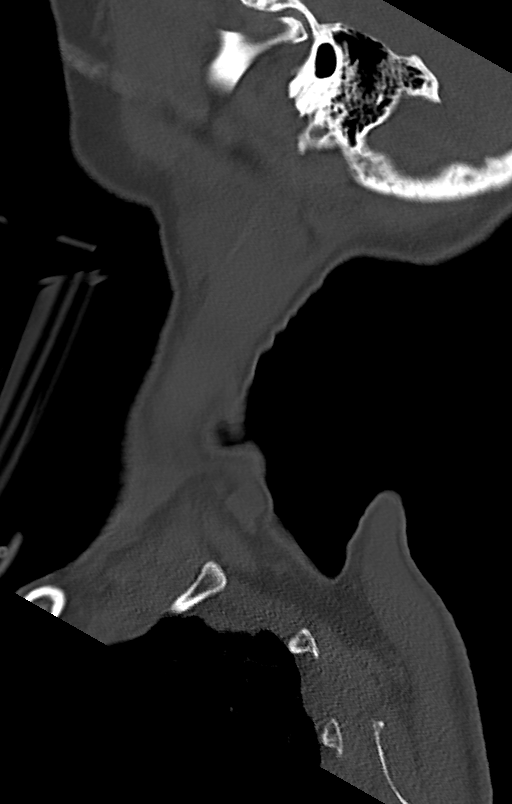

[Series 14: c_spine 2.0 orthogonals · axial · 0.21mm/px · z∈[-212,-155]mm · 2 of 82 slices shown]
[im 28/82  bone]
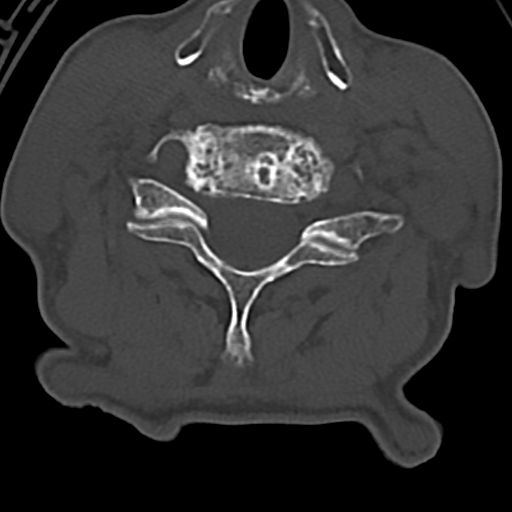
[im 55/82  bone]
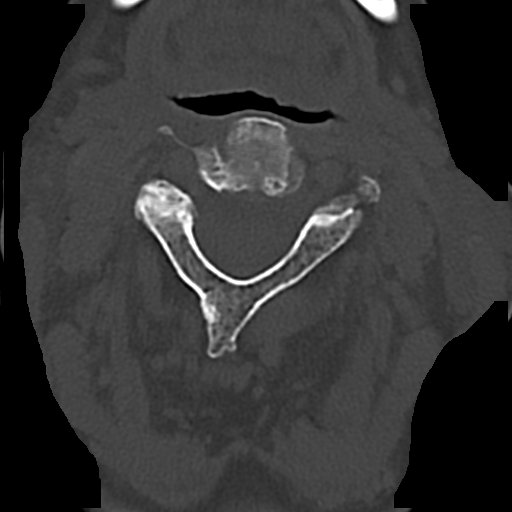

[14 of 33 positions shown; findings below may reference images not displayed]

FINDINGS: CT HEAD FINDINGS

Brain: No evidence of acute infarction, hemorrhage, hydrocephalus,
extra-axial collection or mass lesion/mass effect. Moderate brain
parenchymal volume loss and deep white matter microangiopathy.

Vascular: Calcific atherosclerotic disease at the skull base.

Skull: Normal. Negative for fracture or focal lesion.

Sinuses/Orbits: Partial opacification of the right mastoid air
cells. Mucosal thickening of the bilateral ethmoid sinuses.

Other: None.

CT CERVICAL SPINE FINDINGS

Alignment: C4-C5 2 mm anterolisthesis, likely degenerative.

Skull base and vertebrae: No acute fracture. No primary bone lesion
or focal pathologic process.

Soft tissues and spinal canal: No prevertebral fluid or swelling. No
visible canal hematoma.

Disc levels:  Multilevel osteoarthritic changes.

Upper chest: Biapical scarring.

Other: None.
IMPRESSION: 1. No acute intracranial abnormality.
2. Atrophy, chronic microvascular disease.
3. No evidence of acute traumatic injury to cervical spine.
4. Multilevel osteoarthritic changes of the cervical spine.
5. Partial opacification of the right mastoid air cells. Ethmoid
sinusitis.

## 2020-09-06 IMAGING — US ULTRASOUND ABDOMEN LIMITED
1 series · 14 of 25 positions shown · non-contrast
Comparison: None.

CLINICAL DATA: Elevated liver function tests.  Inpatient.

EXAM:
ULTRASOUND ABDOMEN LIMITED RIGHT UPPER QUADRANT

[Series 1: ultrasound abdomen limited · 14 of 29 slices shown]
[im 1/29]
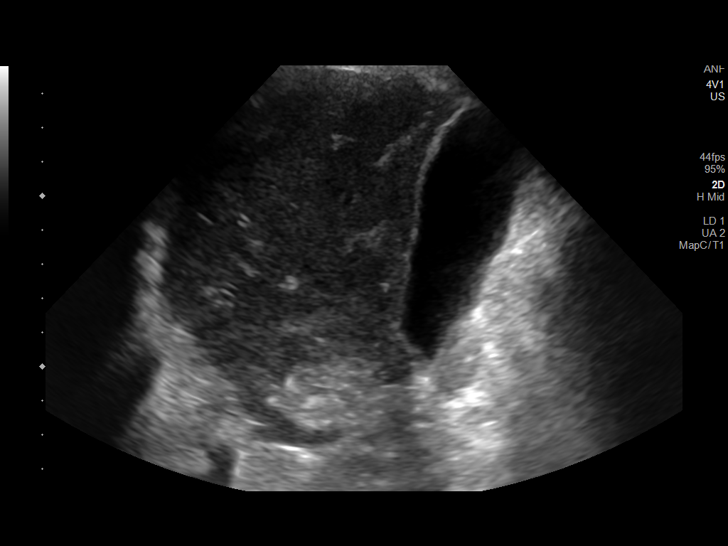
[im 3/29]
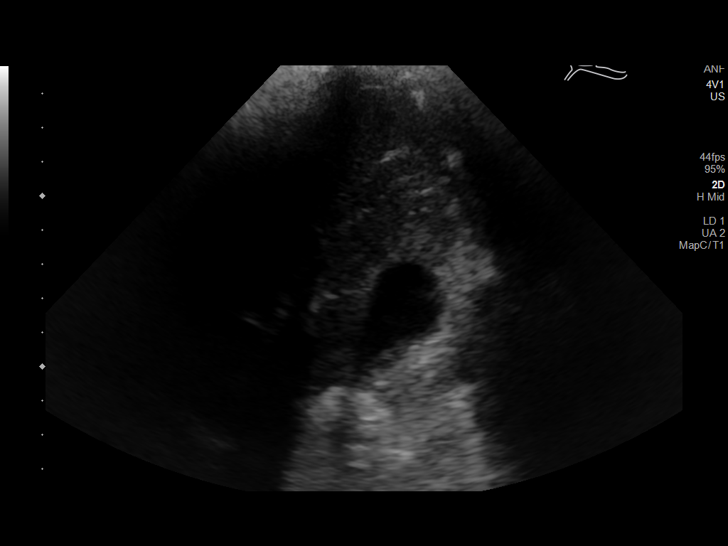
[im 5/29]
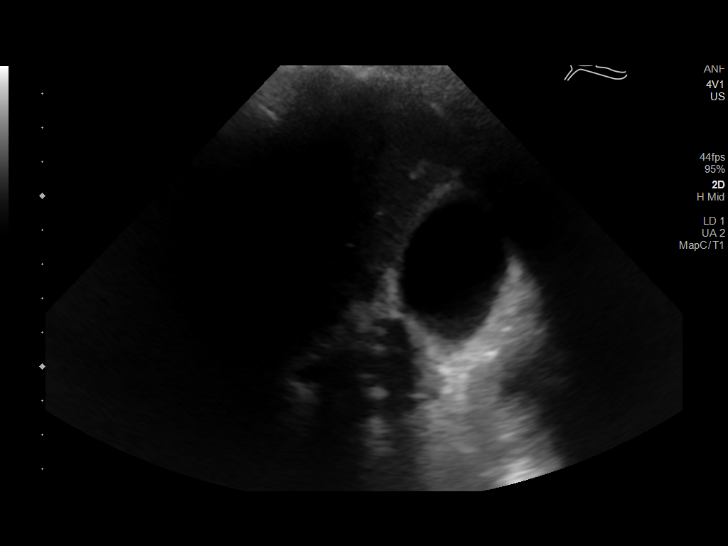
[im 8/29]
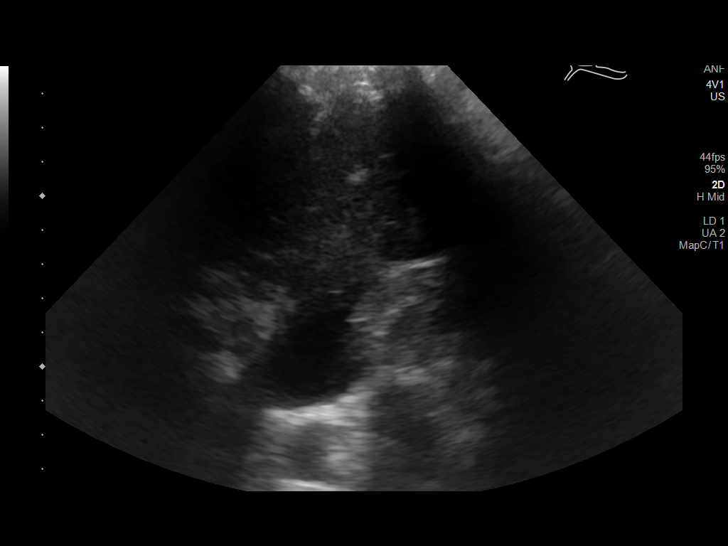
[im 10/29]
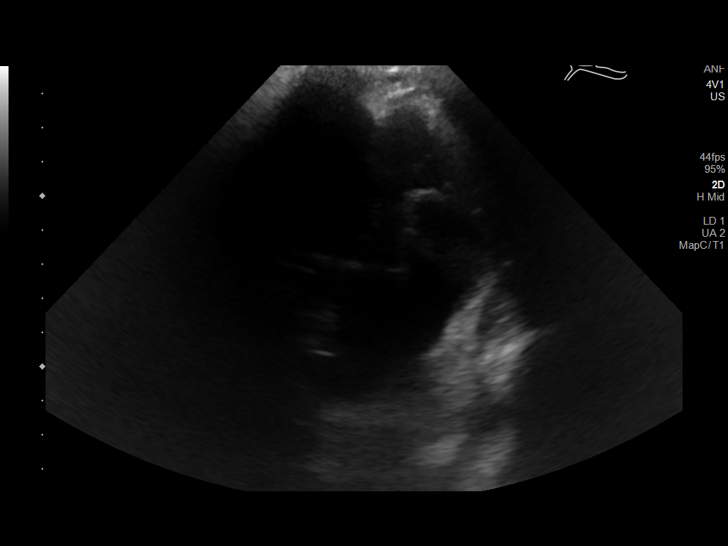
[im 11/29]
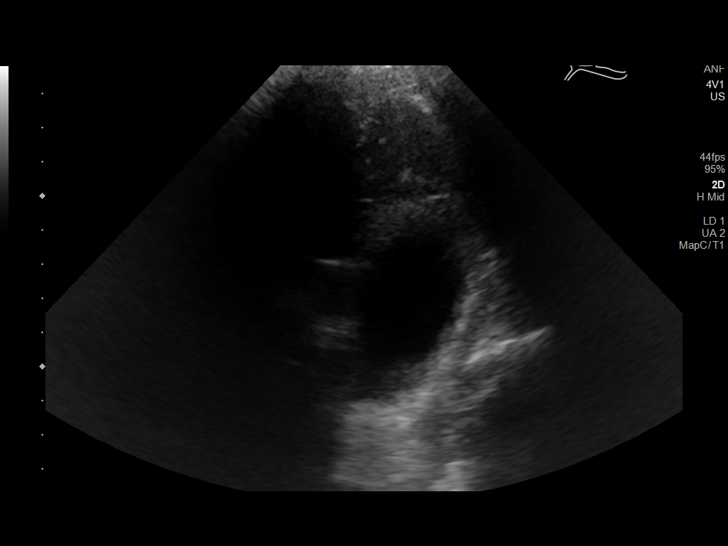
[im 13/29]
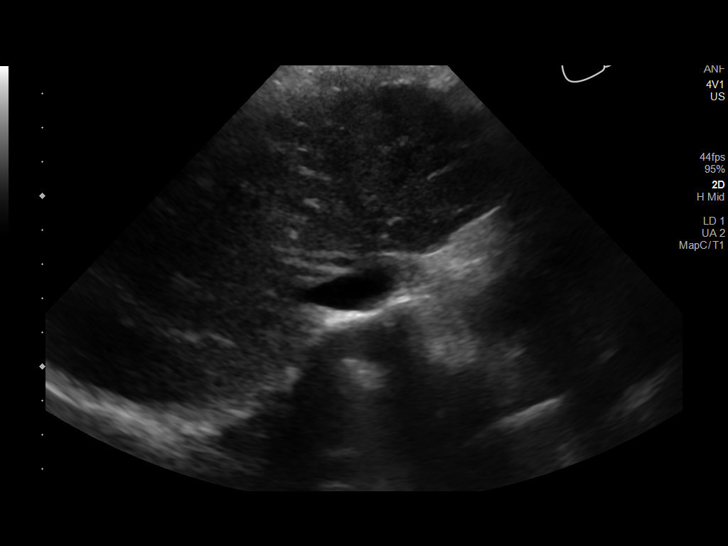
[im 16/29]
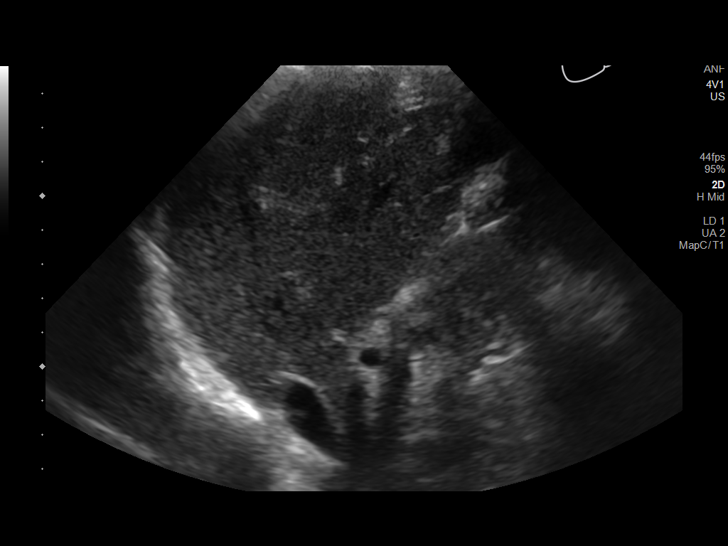
[im 18/29]
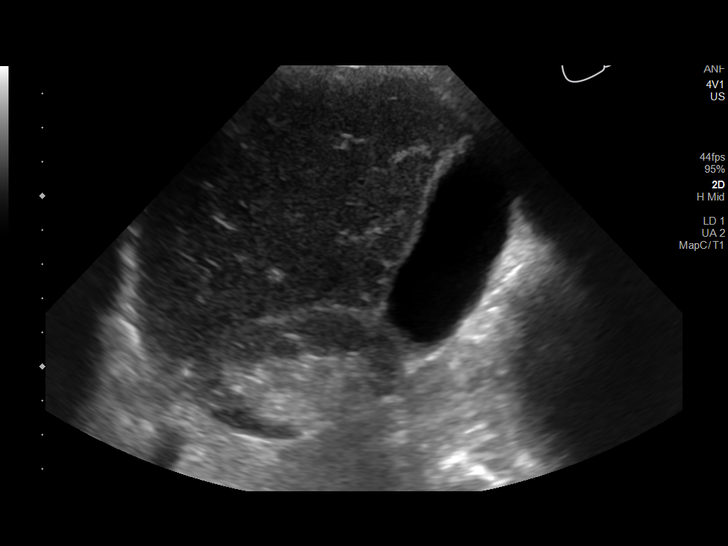
[im 19/29]
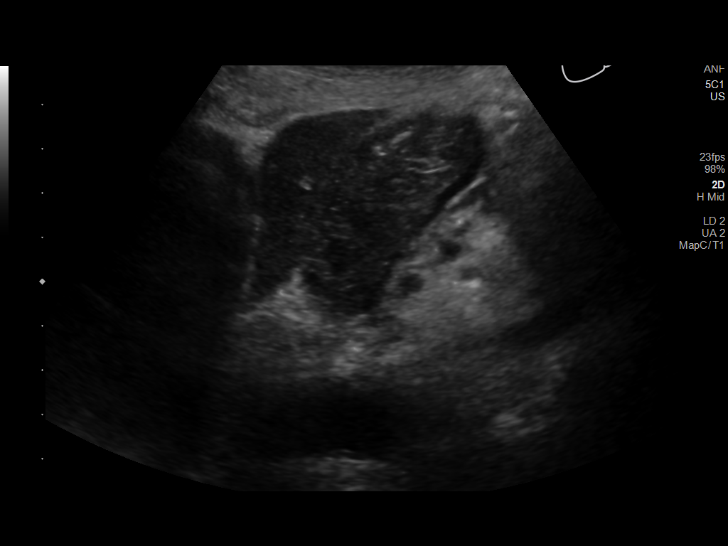
[im 22/29]
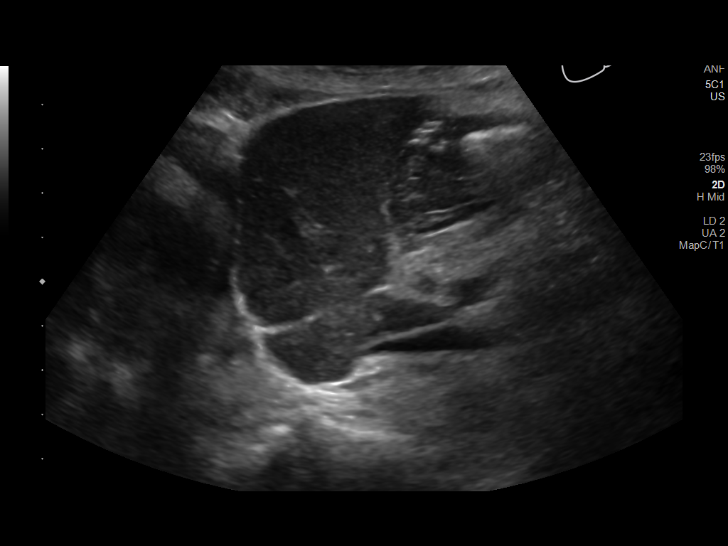
[im 24/29]
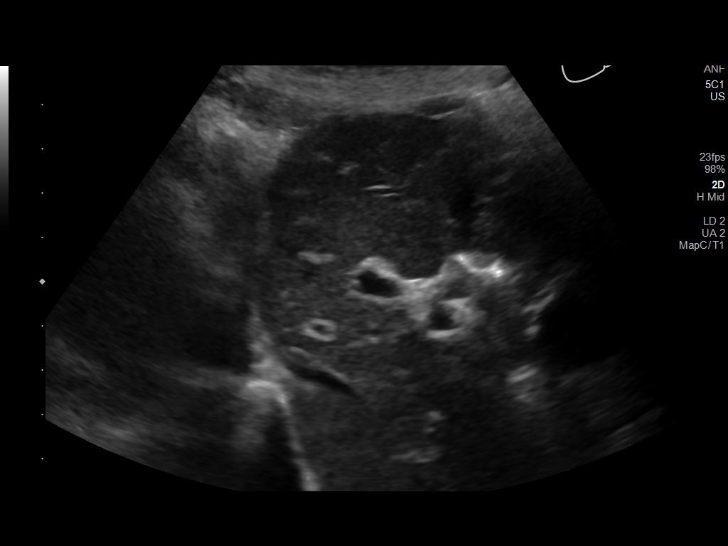
[im 26/29]
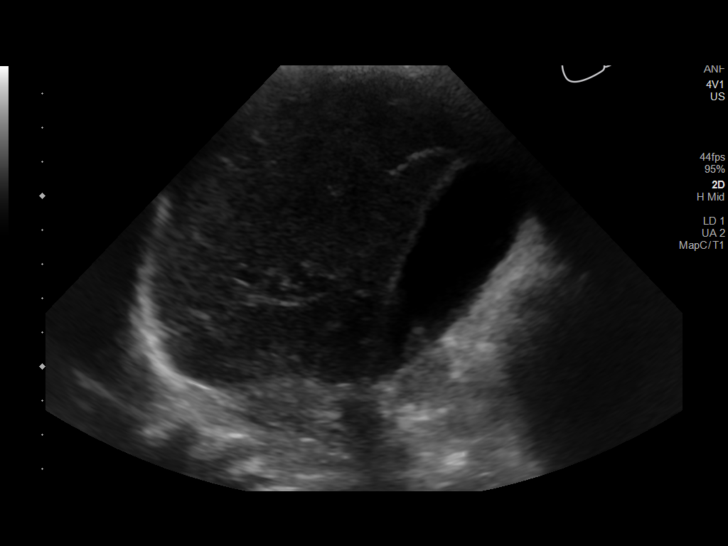
[im 29/29]
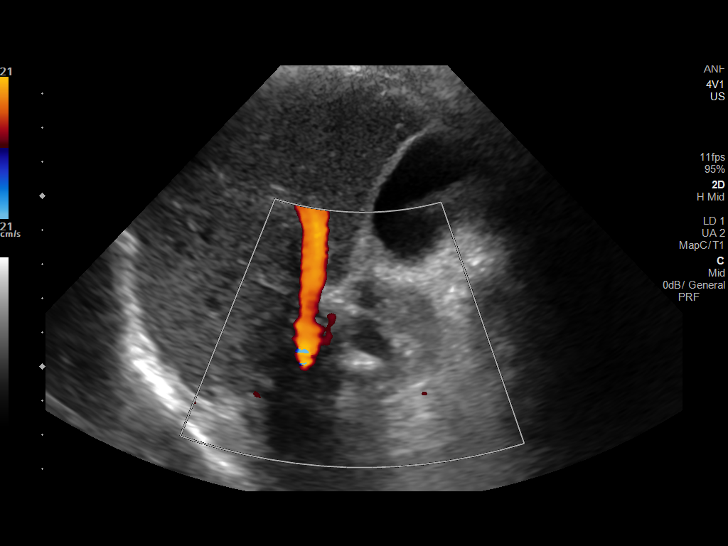

[14 of 25 positions shown; findings below may reference images not displayed]

FINDINGS: Gallbladder:

No gallstones or wall thickening visualized. No sonographic Murphy
sign noted by sonographer.

Common bile duct:

Not visualized. Examination limited by patient body habitus. No
intrahepatic biliary ductal dilatation appreciated.

Liver:

No focal lesion identified. Within normal limits in parenchymal
echogenicity. Portal vein is patent on color Doppler imaging with
normal direction of blood flow towards the liver.
IMPRESSION: 1. Normal gallbladder with no cholelithiasis.
2. Common bile duct unable to be visualized. No intrahepatic biliary
ductal dilatation appreciated. If there is clinical concern for
biliary obstruction, CT abdomen/pelvis with oral and IV contrast
could be obtained for further evaluation.
3. Normal liver.

## 2021-02-10 ENCOUNTER — Telehealth: Payer: Self-pay

## 2021-02-10 NOTE — Telephone Encounter (Signed)
error 

## 2022-02-07 DEATH — deceased
